# Patient Record
Sex: Female | Born: 1940 | Race: White | Hispanic: No | Marital: Married | Smoking: Former smoker
Health system: Southern US, Community
[De-identification: ages and names within clinical notes are randomized; demographics above are authoritative.]

## PROBLEM LIST (undated history)

## (undated) DIAGNOSIS — E785 Hyperlipidemia, unspecified: Secondary | ICD-10-CM

## (undated) DIAGNOSIS — I519 Heart disease, unspecified: Secondary | ICD-10-CM

## (undated) DIAGNOSIS — E538 Deficiency of other specified B group vitamins: Secondary | ICD-10-CM

## (undated) DIAGNOSIS — M109 Gout, unspecified: Secondary | ICD-10-CM

## (undated) DIAGNOSIS — I10 Essential (primary) hypertension: Secondary | ICD-10-CM

## (undated) DIAGNOSIS — S022XXA Fracture of nasal bones, initial encounter for closed fracture: Secondary | ICD-10-CM

## (undated) DIAGNOSIS — G309 Alzheimer's disease, unspecified: Principal | ICD-10-CM

## (undated) DIAGNOSIS — K649 Unspecified hemorrhoids: Secondary | ICD-10-CM

## (undated) DIAGNOSIS — F0281 Dementia in other diseases classified elsewhere with behavioral disturbance: Secondary | ICD-10-CM

## (undated) DIAGNOSIS — M810 Age-related osteoporosis without current pathological fracture: Secondary | ICD-10-CM

## (undated) DIAGNOSIS — I480 Paroxysmal atrial fibrillation: Secondary | ICD-10-CM

## (undated) DIAGNOSIS — R413 Other amnesia: Secondary | ICD-10-CM

## (undated) DIAGNOSIS — F0280 Dementia in other diseases classified elsewhere without behavioral disturbance: Secondary | ICD-10-CM

## (undated) HISTORY — DX: Alzheimer's disease, unspecified: G30.9

## (undated) HISTORY — DX: Paroxysmal atrial fibrillation: I48.0

## (undated) HISTORY — DX: Deficiency of other specified B group vitamins: E53.8

## (undated) HISTORY — DX: Dementia in other diseases classified elsewhere without behavioral disturbance: F02.80

## (undated) HISTORY — PX: TUBAL LIGATION: SHX77

## (undated) HISTORY — DX: Dementia in other diseases classified elsewhere with behavioral disturbance: F02.81

## (undated) HISTORY — DX: Essential (primary) hypertension: I10

## (undated) HISTORY — DX: Other amnesia: R41.3

## (undated) HISTORY — DX: Gout, unspecified: M10.9

## (undated) HISTORY — DX: Age-related osteoporosis without current pathological fracture: M81.0

## (undated) HISTORY — DX: Hyperlipidemia, unspecified: E78.5

## (undated) HISTORY — DX: Heart disease, unspecified: I51.9

## (undated) HISTORY — DX: Fracture of nasal bones, initial encounter for closed fracture: S02.2XXA

## (undated) HISTORY — DX: Unspecified hemorrhoids: K64.9

---

## 2005-04-28 ENCOUNTER — Ambulatory Visit: Payer: Self-pay | Admitting: Rheumatology

## 2006-07-26 ENCOUNTER — Ambulatory Visit: Payer: Self-pay | Admitting: Gastroenterology

## 2007-09-20 ENCOUNTER — Ambulatory Visit: Payer: Self-pay | Admitting: Unknown Physician Specialty

## 2010-03-17 ENCOUNTER — Emergency Department: Payer: Self-pay | Admitting: Internal Medicine

## 2010-03-18 ENCOUNTER — Encounter: Payer: Self-pay | Admitting: Cardiovascular Disease

## 2010-03-18 DIAGNOSIS — R0602 Shortness of breath: Secondary | ICD-10-CM | POA: Insufficient documentation

## 2010-03-18 DIAGNOSIS — R079 Chest pain, unspecified: Secondary | ICD-10-CM

## 2010-03-25 ENCOUNTER — Ambulatory Visit: Payer: Self-pay | Admitting: Cardiovascular Disease

## 2010-03-26 ENCOUNTER — Telehealth: Payer: Self-pay | Admitting: Cardiovascular Disease

## 2010-07-29 ENCOUNTER — Ambulatory Visit: Payer: Self-pay | Admitting: Internal Medicine

## 2011-01-20 NOTE — Miscellaneous (Signed)
Summary: MV order  Clinical Lists Changes  Problems: Added new problem of CHEST PAIN UNSPECIFIED (ICD-786.50) Added new problem of SHORTNESS OF BREATH (ICD-786.05) Orders: Added new Referral order of Nuclear Stress Test (Nuc Stress Test) - Signed

## 2011-01-20 NOTE — Progress Notes (Signed)
Summary: LEXISCAN  Phone Note Call from Patient Call back at [phone]   Caller: SELF Call For: [facility] Summary of Call: PT IS CALLING ABOUT LEXISCAN FROM YESTERDAY-WAS TOLD TO CALL IF SHE HAD NOT HEARD ANYTHING TO CALL THIS AFTERNOON-[facility] TOLD HER THAT [facility] SIGNED THE REPORT AND HOUR AGO.  PLEASE CALL #[phone] Initial call taken by: Harlon Flor,  March 26, 2010 3:16 PM  Follow-up for Phone Call        instructed pt that a final report had not been issued.  When the information becomes available she will be notified of results.  Follow-up by: Charlena Cross, RN, BSN,  March 26, 2010 3:27 PM  Additional Follow-up for Phone Call Additional follow up Details #1::        preliminary report given. Additional Follow-up by: Charlena Cross, RN, BSN,  March 27, 2010 10:36 AM

## 2011-07-08 ENCOUNTER — Ambulatory Visit: Payer: Self-pay | Admitting: Internal Medicine

## 2011-07-30 ENCOUNTER — Encounter: Payer: Self-pay | Admitting: Internal Medicine

## 2012-01-22 ENCOUNTER — Other Ambulatory Visit: Payer: Self-pay | Admitting: *Deleted

## 2012-01-22 MED ORDER — BENAZEPRIL-HYDROCHLOROTHIAZIDE 20-12.5 MG PO TABS: 1.0000 | ORAL_TABLET | Freq: Every day | ORAL | Status: DC

## 2012-02-15 ENCOUNTER — Telehealth: Payer: Self-pay | Admitting: *Deleted

## 2012-02-15 MED ORDER — LANSOPRAZOLE 30 MG PO CPDR: 30.0000 mg | DELAYED_RELEASE_CAPSULE | Freq: Every day | ORAL | Status: DC

## 2012-02-15 NOTE — Telephone Encounter (Signed)
Pharm faxed RF request -  Lansoprazole 30 mg 1 qd. OK?

## 2012-02-15 NOTE — Telephone Encounter (Signed)
OK to fill

## 2012-02-23 DIAGNOSIS — L719 Rosacea, unspecified: Secondary | ICD-10-CM | POA: Diagnosis not present

## 2012-04-04 ENCOUNTER — Other Ambulatory Visit: Payer: Self-pay | Admitting: Internal Medicine

## 2012-06-03 ENCOUNTER — Ambulatory Visit (INDEPENDENT_AMBULATORY_CARE_PROVIDER_SITE_OTHER): Payer: Medicare Other | Admitting: Internal Medicine

## 2012-06-03 ENCOUNTER — Encounter: Payer: Self-pay | Admitting: Internal Medicine

## 2012-06-03 ENCOUNTER — Telehealth: Payer: Self-pay | Admitting: Internal Medicine

## 2012-06-03 VITALS — BP 104/60 | HR 80 | Temp 98.7°F | Ht 60.25 in | Wt 151.0 lb

## 2012-06-03 DIAGNOSIS — M109 Gout, unspecified: Secondary | ICD-10-CM | POA: Diagnosis not present

## 2012-06-03 DIAGNOSIS — Z23 Encounter for immunization: Secondary | ICD-10-CM | POA: Diagnosis not present

## 2012-06-03 DIAGNOSIS — Z0000 Encounter for general adult medical examination without abnormal findings: Secondary | ICD-10-CM

## 2012-06-03 DIAGNOSIS — I10 Essential (primary) hypertension: Secondary | ICD-10-CM

## 2012-06-03 DIAGNOSIS — M810 Age-related osteoporosis without current pathological fracture: Secondary | ICD-10-CM

## 2012-06-03 LAB — COMPREHENSIVE METABOLIC PANEL
Albumin: 4 g/dL (ref 3.5–5.2)
BUN: 24 mg/dL — ABNORMAL HIGH (ref 6–23)
Calcium: 9.5 mg/dL (ref 8.4–10.5)
Chloride: 102 mEq/L (ref 96–112)
Creatinine, Ser: 1.1 mg/dL (ref 0.4–1.2)
Glucose, Bld: 98 mg/dL (ref 70–99)
Potassium: 3.7 mEq/L (ref 3.5–5.1)

## 2012-06-03 LAB — LIPID PANEL
Cholesterol: 220 mg/dL — ABNORMAL HIGH (ref 0–200)
Triglycerides: 228 mg/dL — ABNORMAL HIGH (ref 0.0–149.0)

## 2012-06-03 LAB — LDL CHOLESTEROL, DIRECT: Direct LDL: 137.4 mg/dL

## 2012-06-03 LAB — MICROALBUMIN / CREATININE URINE RATIO: Microalb Creat Ratio: 0.3 mg/g (ref 0.0–30.0)

## 2012-06-03 MED ORDER — FEBUXOSTAT 80 MG PO TABS: 80.0000 mg | ORAL_TABLET | Freq: Every day | ORAL | Status: DC

## 2012-06-03 NOTE — Telephone Encounter (Signed)
Patient has low bone density based on DEXA scan from 2011. She needs evaluation for Prolia. She has used this in the past. We need to get insurance approval

## 2012-06-03 NOTE — Assessment & Plan Note (Signed)
Previously on Prolia. Will look into insurance coverage for this medication.

## 2012-06-03 NOTE — Assessment & Plan Note (Signed)
Currently asymptomatic. Will continue Uloric. Uric acid level today.

## 2012-06-03 NOTE — Assessment & Plan Note (Signed)
General exam normal today. Patient is up-to-date on health maintenance. Will get records on recent mammogram and bone density testing. Will repeat labs today including CBC, CMP, lipid profile. Followup 6 months or sooner if needed.

## 2012-06-03 NOTE — Progress Notes (Signed)
Subjective:    Patient ID: Jennifer Klein, female    DOB: [birth year], 71 y.o.   MRN: [id number]  HPI The patient is here for annual Medicare wellness examination and management of other chronic and acute problems.   The risk factors are reflected in the social history.  The roster of all physicians providing medical care to patient - is listed in the Snapshot section of the chart.  Activities of daily living:  The patient is 100% independent in all ADLs: dressing, toileting, feeding as well as independent mobility  Home safety : The patient has smoke detectors in the home. They wear seatbelts.  There are no firearms at home. There is no violence in the home.   There is no risks for hepatitis, STDs or HIV. There is no history of blood transfusion. They have no travel history to infectious disease endemic areas of the world.  The patient has seen their dentist in the last six month (Dr. Dossie Der). They have seen their eye doctor in the last year [facility] - Dr. Oren Bracket, changing to Dr. Clydene Pugh).No issues with hearing.  They do not  have excessive sun exposure. Discussed the need for sun protection: hats, long sleeves and use of sunscreen if there is significant sun exposure.   Diet: the importance of a healthy diet is discussed. They do have a healthy diet.  The benefits of regular aerobic exercise were discussed. She occasionally goes to [employer]. Recommended walking 5 days per week.  Depression screen: there are no signs or vegative symptoms of depression- irritability, change in appetite, anhedonia, sadness/tearfullness.  Cognitive assessment: the patient manages all their financial and personal affairs and is actively engaged. They could relate day,date,year and events.  The following portions of the patient's history were reviewed and updated as appropriate: allergies, current medications, past family history, past medical history,  past surgical history, past social history   and problem list.  Visual acuity was not assessed per patient preference since she has regular follow up with her ophthalmologist. Hearing and body mass index were assessed and reviewed.   During the course of the visit the patient was educated and counseled about appropriate screening and preventive services including : fall prevention , diabetes screening, nutrition counseling, colorectal cancer screening, and recommended immunizations.     Outpatient Encounter Prescriptions as of 06/03/2012  Medication Sig Dispense Refill  . benazepril-hydrochlorthiazide (LOTENSIN HCT) 20-12.5 MG per tablet Take 1 tablet by mouth daily.  90 tablet  1  . busPIRone (BUSPAR) 15 MG tablet TAKE ONE TABLET BY MOUTH ONCE DAILY  30 tablet  3  . fexofenadine (ALLERGY RELIEF) 180 MG tablet Take 180 mg by mouth daily.      . lansoprazole (PREVACID) 30 MG capsule Take 1 capsule (30 mg total) by mouth daily.  90 capsule  1  . DISCONTD: febuxostat (ULORIC) 40 MG tablet Take 80 mg by mouth daily.      . Febuxostat (ULORIC) 80 MG TABS Take 1 tablet (80 mg total) by mouth daily.  30 tablet  11    Review of Systems  Constitutional: Negative for fever, chills, appetite change, fatigue and unexpected weight change.  HENT: Negative for ear pain, congestion, sore throat, trouble swallowing, neck pain, voice change and sinus pressure.   Eyes: Negative for visual disturbance.  Respiratory: Negative for cough, shortness of breath, wheezing and stridor.   Cardiovascular: Negative for chest pain, palpitations and leg swelling.  Gastrointestinal: Negative for nausea, vomiting, abdominal pain, diarrhea, constipation,  blood in stool, abdominal distention and anal bleeding.  Genitourinary: Negative for dysuria and flank pain.  Musculoskeletal: Negative for myalgias, arthralgias and gait problem.  Skin: Negative for color change and rash.  Neurological: Negative for dizziness and headaches.  Hematological: Negative for adenopathy.  Does not bruise/bleed easily.  Psychiatric/Behavioral: Negative for suicidal ideas, disturbed wake/sleep cycle and dysphoric mood. The patient is not nervous/anxious.    BP 104/60  Pulse 80  Temp 98.7 F (37.1 C) (Oral)  Ht 5' 0.25" (1.53 m)  Wt 151 lb (68.493 kg)  BMI 29.25 kg/m2  SpO2 97%     Objective:   Physical Exam  Constitutional: She is oriented to person, place, and time. She appears well-developed and well-nourished. No distress.  HENT:  Head: Normocephalic and atraumatic.  Right Ear: External ear normal.  Left Ear: External ear normal.  Nose: Nose normal.  Mouth/Throat: Oropharynx is clear and moist. No oropharyngeal exudate.  Eyes: Conjunctivae are normal. Pupils are equal, round, and reactive to light. Right eye exhibits no discharge. Left eye exhibits no discharge. No scleral icterus.  Neck: Normal range of motion. Neck supple. No tracheal deviation present. No thyromegaly present.  Cardiovascular: Normal rate, regular rhythm, normal heart sounds and intact distal pulses.  Exam reveals no gallop and no friction rub.   No murmur heard. Pulmonary/Chest: Effort normal and breath sounds normal. No respiratory distress. She has no wheezes. She has no rales. She exhibits no tenderness.  Abdominal: Soft. Bowel sounds are normal. She exhibits no distension and no mass. There is no tenderness. There is no guarding.  Musculoskeletal: Normal range of motion. She exhibits no edema and no tenderness.  Lymphadenopathy:    She has no cervical adenopathy.  Neurological: She is alert and oriented to person, place, and time. No cranial nerve deficit. She exhibits normal muscle tone. Coordination normal.  Skin: Skin is warm and dry. No rash noted. She is not diaphoretic. No erythema. No pallor.  Psychiatric: She has a normal mood and affect. Her behavior is normal. Judgment and thought content normal.          Assessment & Plan:

## 2012-06-03 NOTE — Assessment & Plan Note (Signed)
Blood pressure well-controlled today. Will continue current medications. Will check renal function with labs. Follow up 6 months.

## 2012-06-07 NOTE — Telephone Encounter (Signed)
I have emailed Ruby Mcclinton to [employer] about approval for this.

## 2012-06-28 ENCOUNTER — Telehealth: Payer: Self-pay | Admitting: Internal Medicine

## 2012-06-28 NOTE — Telephone Encounter (Signed)
Patient aware of Mammogram appt.

## 2012-06-28 NOTE — Telephone Encounter (Signed)
Patient scheduled at [facility] on 8.13.13 @ 12:45 arrive at 12:30 . No Calcium products 24 hours before appointment. Left message for patient to call office.

## 2012-07-26 ENCOUNTER — Telehealth: Payer: Self-pay | Admitting: *Deleted

## 2012-07-26 NOTE — Telephone Encounter (Signed)
Received fax from pharmacy stating that PA is needed on Uloric 80mg , called [employer] [facility] and they faxed PA form.  Form given to Dr. Dan Humphreys.

## 2012-07-28 ENCOUNTER — Telehealth: Payer: Self-pay | Admitting: *Deleted

## 2012-07-28 NOTE — Telephone Encounter (Signed)
Received PA approval for Uloric 80mg , approval good for the life of the policy.  Pharmacy notified.

## 2012-08-01 ENCOUNTER — Telehealth: Payer: Self-pay | Admitting: Internal Medicine

## 2012-08-01 NOTE — Telephone Encounter (Signed)
Received fax from insurance company stating that patient would need to pay 147. For her prolia shot.

## 2012-08-01 NOTE — Telephone Encounter (Signed)
Patient advised as instructed via telephone, she will let us know if she would like [place] to order this injection.

## 2012-08-02 ENCOUNTER — Ambulatory Visit: Payer: Self-pay | Admitting: Internal Medicine

## 2012-08-02 DIAGNOSIS — M899 Disorder of bone, unspecified: Secondary | ICD-10-CM | POA: Diagnosis not present

## 2012-08-02 DIAGNOSIS — Z1231 Encounter for screening mammogram for malignant neoplasm of breast: Secondary | ICD-10-CM | POA: Diagnosis not present

## 2012-08-02 DIAGNOSIS — Z13820 Encounter for screening for osteoporosis: Secondary | ICD-10-CM | POA: Diagnosis not present

## 2012-08-02 LAB — HM MAMMOGRAPHY: HM Mammogram: NORMAL

## 2012-08-03 ENCOUNTER — Telehealth: Payer: Self-pay | Admitting: Internal Medicine

## 2012-08-03 NOTE — Telephone Encounter (Signed)
Bone density test showed osteopenia with T-score -2.3. We should make an appointment to discuss treatment

## 2012-08-04 NOTE — Telephone Encounter (Signed)
Left message with spouse to have patient return call.

## 2012-08-04 NOTE — Telephone Encounter (Signed)
Patient advised as instructed via telephone, appt scheduled for 08/08/2012 at 11:00.

## 2012-08-08 ENCOUNTER — Encounter: Payer: Self-pay | Admitting: Internal Medicine

## 2012-08-08 ENCOUNTER — Ambulatory Visit (INDEPENDENT_AMBULATORY_CARE_PROVIDER_SITE_OTHER): Payer: Medicare Other | Admitting: Internal Medicine

## 2012-08-08 VITALS — BP 112/70 | HR 64 | Temp 97.7°F | Ht 60.25 in | Wt 152.2 lb

## 2012-08-08 DIAGNOSIS — R413 Other amnesia: Secondary | ICD-10-CM | POA: Diagnosis not present

## 2012-08-08 DIAGNOSIS — M8580 Other specified disorders of bone density and structure, unspecified site: Secondary | ICD-10-CM

## 2012-08-08 DIAGNOSIS — M899 Disorder of bone, unspecified: Secondary | ICD-10-CM

## 2012-08-08 LAB — COMPREHENSIVE METABOLIC PANEL
Albumin: 3.8 g/dL (ref 3.5–5.2)
CO2: 27 mEq/L (ref 19–32)
GFR: 61.32 mL/min (ref >60.00)
Glucose, Bld: 78 mg/dL (ref 70–99)
Potassium: 3.8 mEq/L (ref 3.5–5.1)
Sodium: 139 mEq/L (ref 135–145)
Total Bilirubin: 0.6 mg/dL (ref 0.3–1.2)
Total Protein: 6.7 g/dL (ref 6.0–8.3)

## 2012-08-08 NOTE — Progress Notes (Signed)
Subjective:    Patient ID: Jennifer Klein, female    DOB: 1941/07/31, 71 y.o.   MRN: [id number]  HPI 71 year old female with history of hypertension, gout, and osteoporosis presents for followup. In regards to her osteoporosis, recent bone density testing showed T score of -2.3. She had taken Prolia in the past but has not recently had this medication. She would like to resume therapy. She has not had any fractures.  She is concerned today about memory loss. She reports that one of her family members noted that she repeated herself. We reviewed testing performed by psychologist in August 2012. This showed mild early dementia. She is not currently interested in additional evaluation or trying medication. 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 She is very active and participates in an art class several times per week.  Outpatient Encounter Prescriptions as of 08/08/2012  Medication Sig Dispense Refill  . benazepril-hydrochlorthiazide (LOTENSIN HCT) 20-12.5 MG per tablet Take 1 tablet by mouth daily.  90 tablet  1  . busPIRone (BUSPAR) 15 MG tablet TAKE ONE TABLET BY MOUTH ONCE DAILY  30 tablet  3  . Febuxostat (ULORIC) 80 MG TABS Take 1 tablet (80 mg total) by mouth daily.  30 tablet  11  . fexofenadine (ALLERGY RELIEF) 180 MG tablet Take 180 mg by mouth daily.      . lansoprazole (PREVACID) 30 MG capsule Take 1 capsule (30 mg total) by mouth daily.  90 capsule  1    Review of Systems  Constitutional: Negative for fever, chills, appetite change, fatigue and unexpected weight change.  HENT: Negative for ear pain, congestion, sore throat, trouble swallowing, neck pain, voice change and sinus pressure.   Eyes: Negative for visual disturbance.  Respiratory: Negative for cough, shortness of breath, wheezing and stridor.   Cardiovascular: Negative for chest pain, palpitations and leg swelling.    Gastrointestinal: Negative for nausea, vomiting, abdominal pain, diarrhea, constipation, blood in stool, abdominal distention and anal bleeding.  Genitourinary: Negative for dysuria and flank pain.  Musculoskeletal: Negative for myalgias, arthralgias and gait problem.  Skin: Negative for color change and rash.  Neurological: Negative for dizziness and headaches.  Hematological: Negative for adenopathy. Does not bruise/bleed easily.  Psychiatric/Behavioral: Negative for suicidal ideas, disturbed wake/sleep cycle and dysphoric mood. The patient is not nervous/anxious.    BP 112/70  Pulse 64  Temp 97.7 F (36.5 C) (Oral)  Ht 5' 0.25" (1.53 m)  Wt 152 lb 4 oz (69.06 kg)  BMI 29.49 kg/m2  SpO2 97%     Objective:   Physical Exam  Constitutional: She is oriented to person, place, and time. She appears well-developed and well-nourished. No distress.  HENT:  Head: Normocephalic and atraumatic.  Right Ear: External ear normal.  Left Ear: External ear normal.  Nose: Nose normal.  Mouth/Throat: Oropharynx is clear and moist. No oropharyngeal exudate.  Eyes: Conjunctivae are normal. Pupils are equal, round, and reactive to light. Right eye exhibits no discharge. Left eye exhibits no discharge. No scleral icterus.  Neck: Normal range of motion. Neck supple. No tracheal deviation present. No thyromegaly present.  Cardiovascular: Normal rate, regular rhythm, normal heart sounds and intact distal pulses.  Exam reveals no gallop and no friction rub.   No murmur heard. Pulmonary/Chest: Effort normal and breath sounds normal. No respiratory distress. She has no wheezes. She has no rales. She exhibits no tenderness.  Musculoskeletal:  Normal range of motion. She exhibits no edema and no tenderness.  Lymphadenopathy:    She has no cervical adenopathy.  Neurological: She is alert and oriented to person, place, and time. No cranial nerve deficit. She exhibits normal muscle tone. Coordination normal.   Skin: Skin is warm and dry. No rash noted. She is not diaphoretic. No erythema. No pallor.  Psychiatric: She has a normal mood and affect. Her behavior is normal. Judgment and thought content normal.          Assessment & Plan:

## 2012-08-08 NOTE — Assessment & Plan Note (Signed)
Reviewed cognitive testing which showed mild memory loss as of August 2012. Patient is not interested in referral to tertiary care center for additional testing at this time. She is not interested in medications. Will continue to monitor.

## 2012-08-08 NOTE — Assessment & Plan Note (Addendum)
Recent bone density testing was consistent with osteopenia with T score of -2.3. We'll plan to resume therapy with Prolia. medication has been ordered. Will check calcium and vitamin D levels with labs today

## 2012-08-09 LAB — VITAMIN D 25 HYDROXY (VIT D DEFICIENCY, FRACTURES): Vit D, 25-Hydroxy: 44 ng/mL (ref 30–89)

## 2012-10-03 ENCOUNTER — Encounter: Payer: Self-pay | Admitting: Internal Medicine

## 2012-10-03 ENCOUNTER — Ambulatory Visit (INDEPENDENT_AMBULATORY_CARE_PROVIDER_SITE_OTHER): Payer: Medicare Other | Admitting: Internal Medicine

## 2012-10-03 ENCOUNTER — Other Ambulatory Visit: Payer: Self-pay

## 2012-10-03 VITALS — BP 138/82 | HR 76 | Temp 97.9°F | Ht 60.25 in | Wt 152.8 lb

## 2012-10-03 DIAGNOSIS — M899 Disorder of bone, unspecified: Secondary | ICD-10-CM | POA: Diagnosis not present

## 2012-10-03 DIAGNOSIS — M810 Age-related osteoporosis without current pathological fracture: Secondary | ICD-10-CM | POA: Diagnosis not present

## 2012-10-03 DIAGNOSIS — R413 Other amnesia: Secondary | ICD-10-CM

## 2012-10-03 DIAGNOSIS — M8580 Other specified disorders of bone density and structure, unspecified site: Secondary | ICD-10-CM

## 2012-10-03 DIAGNOSIS — I10 Essential (primary) hypertension: Secondary | ICD-10-CM | POA: Diagnosis not present

## 2012-10-03 MED ORDER — DENOSUMAB 60 MG/ML ~~LOC~~ SOLN
60.0000 mg | Freq: Once | SUBCUTANEOUS | Status: AC
  Administered 2012-10-03: 60 mg via SUBCUTANEOUS

## 2012-10-03 NOTE — Assessment & Plan Note (Signed)
Patient with osteopenia and recent T score of -2.3. Reviewed risk of fracture today. Will restart treatment with Prolia and patient will continue home dosing of calcium 1200 mg daily and vitamin D 1000 units daily. Next Prolia due 03/2013.

## 2012-10-03 NOTE — Assessment & Plan Note (Signed)
Blood pressure is well-controlled on current medications. Will check renal function with labs today.

## 2012-10-03 NOTE — Assessment & Plan Note (Signed)
Ongoing concerns about memory loss. Local evaluation with psychologist showed mild early dementia. Will recheck TSH and B12 with labs today. Will set up referral to tertiary care Center for more complete evaluation for early dementia. We discussed potential use of medications for dementia such as Aricept or Namenda, but patient would prefer to hold off for now.

## 2012-10-03 NOTE — Progress Notes (Signed)
Subjective:    Patient ID: Jennifer Klein, female    DOB: 13-Jul-1941, 71 y.o.   MRN: [id number]  HPI 71 year old female with history of hypertension, gout, mild dementia, and osteopenia presents for followup. She has 2 concerns today. First, in regards to osteopenia she is given more thought to continuing treatment with Prolia, and she would like to move forward with this. She continues on calcium and vitamin D supplementations at home. Recent bone density testing showed T score of -2.3.  In regards to memory loss, she reports that friends have noted that she is repeating herself more frequently and seems to be more forgetful. She herself has not noticed this. She is interested in setting up additional evaluation as to the cause of memory loss. She is not interested in starting on medications to help with memory loss at this time.  Outpatient Encounter Prescriptions as of 10/03/2012  Medication Sig Dispense Refill  . benazepril-hydrochlorthiazide (LOTENSIN HCT) 20-12.5 MG per tablet Take 1 tablet by mouth daily.  90 tablet  1  . busPIRone (BUSPAR) 15 MG tablet TAKE ONE TABLET BY MOUTH ONCE DAILY  30 tablet  3  . Febuxostat (ULORIC) 80 MG TABS Take 1 tablet (80 mg total) by mouth daily.  30 tablet  11  . fexofenadine (ALLERGY RELIEF) 180 MG tablet Take 180 mg by mouth daily.      . lansoprazole (PREVACID) 30 MG capsule Take 1 capsule (30 mg total) by mouth daily.  90 capsule  1   Facility-Administered Encounter Medications as of 10/03/2012  Medication Dose Route Frequency Provider Last Rate Last Dose  . denosumab (PROLIA) injection 60 mg  60 mg Subcutaneous Once Shelia Media, MD   60 mg at 10/03/12 1422   BP 138/82  Pulse 76  Temp 97.9 F (36.6 C) (Oral)  Ht 5' 0.25" (1.53 m)  Wt 152 lb 12 oz (69.287 kg)  BMI 29.58 kg/m2  SpO2 94%  Review of Systems  Constitutional: Negative for fever, chills, appetite change, fatigue and unexpected weight change.  HENT: Negative for ear pain,  congestion, sore throat, trouble swallowing, neck pain, voice change and sinus pressure.   Eyes: Negative for visual disturbance.  Respiratory: Negative for cough, shortness of breath, wheezing and stridor.   Cardiovascular: Negative for chest pain, palpitations and leg swelling.  Gastrointestinal: Negative for nausea, vomiting, abdominal pain, diarrhea, constipation, blood in stool, abdominal distention and anal bleeding.  Genitourinary: Negative for dysuria and flank pain.  Musculoskeletal: Negative for myalgias, arthralgias and gait problem.  Skin: Negative for color change and rash.  Neurological: Negative for dizziness and headaches.  Hematological: Negative for adenopathy. Does not bruise/bleed easily.  Psychiatric/Behavioral: Negative for suicidal ideas, confusion, disturbed wake/sleep cycle, dysphoric mood and decreased concentration. The patient is not nervous/anxious.        Objective:   Physical Exam  Constitutional: She is oriented to person, place, and time. She appears well-developed and well-nourished. No distress.  HENT:  Head: Normocephalic and atraumatic.  Right Ear: External ear normal.  Left Ear: External ear normal.  Nose: Nose normal.  Mouth/Throat: Oropharynx is clear and moist. No oropharyngeal exudate.  Eyes: Conjunctivae normal are normal. Pupils are equal, round, and reactive to light. Right eye exhibits no discharge. Left eye exhibits no discharge. No scleral icterus.  Neck: Normal range of motion. Neck supple. No tracheal deviation present. No thyromegaly present.  Cardiovascular: Normal rate, regular rhythm, normal heart sounds and intact distal pulses.  Exam reveals no gallop and  no friction rub.   No murmur heard. Pulmonary/Chest: Effort normal and breath sounds normal. No respiratory distress. She has no wheezes. She has no rales. She exhibits no tenderness.  Musculoskeletal: Normal range of motion. She exhibits no edema and no tenderness.    Lymphadenopathy:    She has no cervical adenopathy.  Neurological: She is alert and oriented to person, place, and time. No cranial nerve deficit. She exhibits normal muscle tone. Coordination normal.  Skin: Skin is warm and dry. No rash noted. She is not diaphoretic. No erythema. No pallor.  Psychiatric: She has a normal mood and affect. Her behavior is normal. Judgment and thought content normal.          Assessment & Plan:

## 2012-10-05 ENCOUNTER — Other Ambulatory Visit: Payer: Medicare Other

## 2012-11-09 DIAGNOSIS — H35079 Retinal telangiectasis, unspecified eye: Secondary | ICD-10-CM | POA: Diagnosis not present

## 2012-11-09 DIAGNOSIS — H2510 Age-related nuclear cataract, unspecified eye: Secondary | ICD-10-CM | POA: Diagnosis not present

## 2012-12-07 ENCOUNTER — Ambulatory Visit: Payer: Medicare Other | Admitting: Internal Medicine

## 2012-12-27 ENCOUNTER — Other Ambulatory Visit: Payer: Self-pay | Admitting: Internal Medicine

## 2012-12-27 MED ORDER — LANSOPRAZOLE 30 MG PO CPDR: 30.0000 mg | DELAYED_RELEASE_CAPSULE | Freq: Every day | ORAL | Status: DC

## 2012-12-27 NOTE — Telephone Encounter (Signed)
lansoprazole (PREVACID) 30 MG capsule  #90

## 2012-12-27 NOTE — Telephone Encounter (Signed)
Rx sent to pharmacy electronically.   

## 2013-01-09 ENCOUNTER — Encounter: Payer: Self-pay | Admitting: Internal Medicine

## 2013-01-09 ENCOUNTER — Ambulatory Visit (INDEPENDENT_AMBULATORY_CARE_PROVIDER_SITE_OTHER): Payer: Medicare Other | Admitting: Internal Medicine

## 2013-01-09 VITALS — BP 116/70 | HR 80 | Temp 98.0°F | Ht 60.03 in | Wt 147.8 lb

## 2013-01-09 DIAGNOSIS — R413 Other amnesia: Secondary | ICD-10-CM

## 2013-01-09 DIAGNOSIS — I10 Essential (primary) hypertension: Secondary | ICD-10-CM

## 2013-01-09 LAB — MICROALBUMIN / CREATININE URINE RATIO
Microalb Creat Ratio: 0.2 mg/g (ref 0.0–30.0)
Microalb, Ur: 0.8 mg/dL (ref 0.0–1.9)

## 2013-01-09 LAB — COMPREHENSIVE METABOLIC PANEL
ALT: 14 U/L (ref 0–35)
Alkaline Phosphatase: 72 U/L (ref 39–117)
CO2: 25 mEq/L (ref 19–32)
Creatinine, Ser: 1.1 mg/dL (ref 0.4–1.2)
GFR: 54.57 mL/min — ABNORMAL LOW (ref >60.00)
Total Bilirubin: 0.8 mg/dL (ref 0.3–1.2)

## 2013-01-09 MED ORDER — BENAZEPRIL-HYDROCHLOROTHIAZIDE 20-12.5 MG PO TABS: 1.0000 | ORAL_TABLET | Freq: Every day | ORAL | Status: DC

## 2013-01-09 NOTE — Progress Notes (Signed)
Subjective:    Patient ID: Jennifer Klein, female    DOB: May 10, 1941, 72 y.o.   MRN: [id number]  HPI 72 year old female with history of hypertension and mild memory loss presents for followup. She reports she is generally doing well. She has no concerns about memory loss, but notes that her close friends note she often repeats herself. She continues to wait for appointment to be scheduled at [facility]. She reports that she is currently on a waiting list. She has received packet of information and has filled out and is ready for the appointment. She denies any new concerns today. 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  Outpatient Encounter Prescriptions as of 01/09/2013  Medication Sig Dispense Refill  . benazepril-hydrochlorthiazide (LOTENSIN HCT) 20-12.5 MG per tablet Take 1 tablet by mouth daily.  90 tablet  4  . busPIRone (BUSPAR) 15 MG tablet TAKE ONE TABLET BY MOUTH ONCE DAILY  30 tablet  3  . Febuxostat (ULORIC) 80 MG TABS Take 1 tablet (80 mg total) by mouth daily.  30 tablet  11  . fexofenadine (ALLERGY RELIEF) 180 MG tablet Take 180 mg by mouth daily.      . lansoprazole (PREVACID) 30 MG capsule Take 1 capsule (30 mg total) by mouth daily.  90 capsule  1  . Loperamide HCl (IMODIUM A-D PO) Take by mouth as needed.      . [DISCONTINUED] benazepril-hydrochlorthiazide (LOTENSIN HCT) 20-12.5 MG per tablet Take 1 tablet by mouth daily.  90 tablet  1   BP 116/70  Pulse 80  Temp 98 F (36.7 C) (Oral)  Ht 5' 0.03" (1.525 m)  Wt 147 lb 12 oz (67.019 kg)  BMI 28.83 kg/m2  SpO2 96%  Review of Systems  Constitutional: Negative for fever, chills, appetite change, fatigue and unexpected weight change.  HENT: Negative for ear pain, congestion, sore throat, trouble swallowing, neck pain, voice change and sinus pressure.   Eyes: Negative for visual disturbance.  Respiratory: Negative for cough, shortness of breath, wheezing and stridor.     Cardiovascular: Negative for chest pain, palpitations and leg swelling.  Gastrointestinal: Negative for nausea, vomiting, abdominal pain, diarrhea, constipation, blood in stool, abdominal distention and anal bleeding.  Genitourinary: Negative for dysuria and flank pain.  Musculoskeletal: Negative for myalgias, arthralgias and gait problem.  Skin: Negative for color change and rash.  Neurological: Negative for dizziness and headaches.  Hematological: Negative for adenopathy. Does not bruise/bleed easily.  Psychiatric/Behavioral: Negative for suicidal ideas, sleep disturbance and dysphoric mood. The patient is not nervous/anxious.        Objective:   Physical Exam  Constitutional: She is oriented to person, place, and time. She appears well-developed and well-nourished. No distress.  HENT:  Head: Normocephalic and atraumatic.  Right Ear: External ear normal.  Left Ear: External ear normal.  Nose: Nose normal.  Mouth/Throat: Oropharynx is clear and moist. No oropharyngeal exudate.  Eyes: Conjunctivae normal are normal. Pupils are equal, round, and reactive to light. Right eye exhibits no discharge. Left eye exhibits no discharge. No scleral icterus.  Neck: Normal range of motion. Neck supple. No tracheal deviation present. No thyromegaly present.  Cardiovascular: Normal rate, regular rhythm, normal heart sounds and intact distal pulses.  Exam reveals no gallop and no friction rub.   No murmur heard. Pulmonary/Chest: Effort normal and breath sounds normal. No respiratory distress. She has no wheezes. She has no rales. She exhibits no tenderness.  Musculoskeletal: Normal range of  motion. She exhibits no edema and no tenderness.  Lymphadenopathy:    She has no cervical adenopathy.  Neurological: She is alert and oriented to person, place, and time. No cranial nerve deficit. She exhibits normal muscle tone. Coordination normal.  Skin: Skin is warm and dry. No rash noted. She is not  diaphoretic. No erythema. No pallor.  Psychiatric: She has a normal mood and affect. Her behavior is normal. Judgment and thought content normal.          Assessment & Plan:

## 2013-01-09 NOTE — Assessment & Plan Note (Signed)
BP Readings from Last 3 Encounters:  01/09/13 116/70  10/03/12 138/82  08/08/12 112/70    BP well controlled on current medications. Will continue Lotensin. Will check renal function and urine microalbumin with labs today.

## 2013-01-09 NOTE — Assessment & Plan Note (Signed)
Symptoms stable. Pt is waiting on appointment with [facility] cognitive specialist. Discussed potentially setting up a referral to [facility], but she would like to hold off for now.

## 2013-01-30 ENCOUNTER — Telehealth: Payer: Self-pay | Admitting: Internal Medicine

## 2013-01-30 NOTE — Telephone Encounter (Signed)
Pt has been tried to call the Neuro Department to get his wife in at [facility] but has not been able to reach anyone over there to make the Appointment.He is not sure what to do at this point.

## 2013-02-08 NOTE — Telephone Encounter (Signed)
Please have pt schedule a visit to discuss a new referral.

## 2013-02-10 NOTE — Telephone Encounter (Signed)
Patient scheduled for appointment 02/15/13 @ 245

## 2013-02-15 ENCOUNTER — Ambulatory Visit: Payer: Medicare Other | Admitting: Internal Medicine

## 2013-03-03 DIAGNOSIS — R21 Rash and other nonspecific skin eruption: Secondary | ICD-10-CM | POA: Diagnosis not present

## 2013-03-15 ENCOUNTER — Ambulatory Visit: Payer: Medicare Other | Admitting: Internal Medicine

## 2013-03-21 DIAGNOSIS — H00029 Hordeolum internum unspecified eye, unspecified eyelid: Secondary | ICD-10-CM | POA: Diagnosis not present

## 2013-03-24 ENCOUNTER — Encounter: Payer: Self-pay | Admitting: Internal Medicine

## 2013-04-03 DIAGNOSIS — H00039 Abscess of eyelid unspecified eye, unspecified eyelid: Secondary | ICD-10-CM | POA: Diagnosis not present

## 2013-04-03 DIAGNOSIS — H0019 Chalazion unspecified eye, unspecified eyelid: Secondary | ICD-10-CM | POA: Diagnosis not present

## 2013-04-10 DIAGNOSIS — H0019 Chalazion unspecified eye, unspecified eyelid: Secondary | ICD-10-CM | POA: Diagnosis not present

## 2013-04-10 DIAGNOSIS — L989 Disorder of the skin and subcutaneous tissue, unspecified: Secondary | ICD-10-CM | POA: Diagnosis not present

## 2013-05-05 DIAGNOSIS — Z0000 Encounter for general adult medical examination without abnormal findings: Secondary | ICD-10-CM | POA: Diagnosis not present

## 2013-05-05 DIAGNOSIS — K589 Irritable bowel syndrome without diarrhea: Secondary | ICD-10-CM | POA: Diagnosis not present

## 2013-05-05 DIAGNOSIS — I10 Essential (primary) hypertension: Secondary | ICD-10-CM | POA: Diagnosis not present

## 2013-05-05 DIAGNOSIS — M109 Gout, unspecified: Secondary | ICD-10-CM | POA: Diagnosis not present

## 2013-05-11 DIAGNOSIS — M810 Age-related osteoporosis without current pathological fracture: Secondary | ICD-10-CM | POA: Diagnosis not present

## 2013-05-24 DIAGNOSIS — R413 Other amnesia: Secondary | ICD-10-CM | POA: Diagnosis not present

## 2013-06-12 DIAGNOSIS — K591 Functional diarrhea: Secondary | ICD-10-CM | POA: Diagnosis not present

## 2013-06-27 ENCOUNTER — Other Ambulatory Visit: Payer: Self-pay | Admitting: *Deleted

## 2013-06-27 MED ORDER — LANSOPRAZOLE 30 MG PO CPDR: 30.0000 mg | DELAYED_RELEASE_CAPSULE | Freq: Every day | ORAL | Status: DC

## 2013-06-27 NOTE — Telephone Encounter (Signed)
Rx sent to pharmacy   

## 2013-07-03 ENCOUNTER — Other Ambulatory Visit: Payer: Self-pay | Admitting: *Deleted

## 2013-07-03 DIAGNOSIS — M109 Gout, unspecified: Secondary | ICD-10-CM

## 2013-07-03 MED ORDER — FEBUXOSTAT 80 MG PO TABS: 80.0000 mg | ORAL_TABLET | Freq: Every day | ORAL | Status: DC

## 2013-07-06 ENCOUNTER — Ambulatory Visit: Payer: Self-pay | Admitting: Gastroenterology

## 2013-07-06 DIAGNOSIS — Z8249 Family history of ischemic heart disease and other diseases of the circulatory system: Secondary | ICD-10-CM | POA: Diagnosis not present

## 2013-07-06 DIAGNOSIS — M109 Gout, unspecified: Secondary | ICD-10-CM | POA: Diagnosis not present

## 2013-07-06 DIAGNOSIS — K648 Other hemorrhoids: Secondary | ICD-10-CM | POA: Diagnosis not present

## 2013-07-06 DIAGNOSIS — R197 Diarrhea, unspecified: Secondary | ICD-10-CM | POA: Diagnosis not present

## 2013-07-06 DIAGNOSIS — Z8049 Family history of malignant neoplasm of other genital organs: Secondary | ICD-10-CM | POA: Diagnosis not present

## 2013-07-06 DIAGNOSIS — Z823 Family history of stroke: Secondary | ICD-10-CM | POA: Diagnosis not present

## 2013-07-06 DIAGNOSIS — Z79899 Other long term (current) drug therapy: Secondary | ICD-10-CM | POA: Diagnosis not present

## 2013-07-06 DIAGNOSIS — K5730 Diverticulosis of large intestine without perforation or abscess without bleeding: Secondary | ICD-10-CM | POA: Diagnosis not present

## 2013-07-06 DIAGNOSIS — I10 Essential (primary) hypertension: Secondary | ICD-10-CM | POA: Diagnosis not present

## 2013-07-06 DIAGNOSIS — M810 Age-related osteoporosis without current pathological fracture: Secondary | ICD-10-CM | POA: Diagnosis not present

## 2013-07-11 DIAGNOSIS — R197 Diarrhea, unspecified: Secondary | ICD-10-CM | POA: Diagnosis not present

## 2013-07-14 ENCOUNTER — Ambulatory Visit (INDEPENDENT_AMBULATORY_CARE_PROVIDER_SITE_OTHER): Payer: Medicare Other | Admitting: Cardiovascular Disease

## 2013-07-14 ENCOUNTER — Encounter: Payer: Self-pay | Admitting: Cardiovascular Disease

## 2013-07-14 ENCOUNTER — Telehealth: Payer: Self-pay | Admitting: *Deleted

## 2013-07-14 VITALS — BP 124/74 | HR 76 | Ht 61.0 in | Wt 138.5 lb

## 2013-07-14 DIAGNOSIS — I10 Essential (primary) hypertension: Secondary | ICD-10-CM

## 2013-07-14 DIAGNOSIS — R002 Palpitations: Secondary | ICD-10-CM

## 2013-07-14 DIAGNOSIS — I499 Cardiac arrhythmia, unspecified: Secondary | ICD-10-CM | POA: Diagnosis not present

## 2013-07-14 NOTE — Telephone Encounter (Signed)
Message copied by Kendrick Fries on Fri Jul 14, 2013  4:01 PM ------      Message from: Lisabeth Devoid F      Created: Fri Jul 14, 2013  3:57 PM      Regarding: 48 hour monitor       Needs 48 hour holter monitor ------

## 2013-07-14 NOTE — Patient Instructions (Addendum)
Your physician has recommended that you wear a holter monitor. Holter monitors are medical devices that record the heart's electrical activity. Doctors most often use these monitors to diagnose arrhythmias. Arrhythmias are problems with the speed or rhythm of the heartbeat. The monitor is a small, portable device. You can wear one while you do your normal daily activities. This is usually used to diagnose what is causing palpitations/syncope (passing out).    Monitor your blood pressure at home 2-3 times /week.  Follow up as needed.

## 2013-07-14 NOTE — Telephone Encounter (Signed)
Faxed order for 48 hour holter monitor to [facility]. They will contact pt to let her know when she can come to [facility] and  have monitor placed.

## 2013-07-14 NOTE — Assessment & Plan Note (Signed)
Blood pressure is controlled on current medications. 

## 2013-07-14 NOTE — Assessment & Plan Note (Signed)
This was noted recently during routine colonoscopy. Possibly due to premature beats. She does have a PAC on her baseline EKG. She is asymptomatic and reports no cardiac symptoms. However, I think it is important to make sure she does not have paroxysmal atrial fibrillation. Thus, I recommend evaluation with a 48-hour Holter monitor.

## 2013-07-14 NOTE — Progress Notes (Signed)
Primary care physician: Dr. Dossie Arbour  HPI  This is a pleasant 72 year old female who was referred by Dr. Servando Snare for evaluation of irregular heartbeats. She has no previous cardiac history. She has known history of hypertension and dementia. She recently underwent routine colonoscopy. The patient mentioned that she was told about high blood pressure after the procedure. However, after I talked with her husband it appears that the issue was irregular heartbeats that were noted on the monitor. The patient denies any chest pain, dyspnea, palpitations, dizziness or syncope.  Allergies  Allergen Reactions  . Darvon (Propoxyphene Hcl)     As a child     Current Outpatient Prescriptions on File Prior to Visit  Medication Sig Dispense Refill  . benazepril-hydrochlorthiazide (LOTENSIN HCT) 20-12.5 MG per tablet Take 1 tablet by mouth daily.  90 tablet  4  . Febuxostat (ULORIC) 80 MG TABS Take 1 tablet (80 mg total) by mouth daily.  30 tablet  0  . fexofenadine (ALLERGY RELIEF) 180 MG tablet Take 180 mg by mouth daily.      . lansoprazole (PREVACID) 30 MG capsule Take 1 capsule (30 mg total) by mouth daily.  90 capsule  0  . Loperamide HCl (IMODIUM A-D PO) Take by mouth as needed.       No current facility-administered medications on file prior to visit.     Past Medical History  Diagnosis Date  . Hypertension   . Gout   . Memory loss   . Senile osteoporosis      Past Surgical History  Procedure Laterality Date  . Tubal ligation       Family History  Problem Relation Age of Onset  . Heart disease Father   . Gout Brother      History   Social History  . Marital Status: Married    Spouse Name: N/A    Number of Children: N/A  . Years of Education: N/A   Occupational History  . Not on file.   Social History Main Topics  . Smoking status: Former Smoker -- 0.25 packs/day for 1 years    Types: Cigarettes  . Smokeless tobacco: Never Used  . Alcohol Use: Yes     Comment:  occassionally  . Drug Use: No  . Sexually Active: Not on file   Other Topics Concern  . Not on file   Social History Narrative   Lives with husband in [place].   Worked at [employer] and [place].      Diet - regular           ROS Constitutional: Negative for fever, chills, diaphoresis, activity change, appetite change and fatigue.  HENT: Negative for hearing loss, nosebleeds, congestion, sore throat, facial swelling, drooling, trouble swallowing, neck pain, voice change, sinus pressure and tinnitus.  Eyes: Negative for photophobia, pain, discharge and visual disturbance.  Respiratory: Negative for apnea, cough, chest tightness, shortness of breath and wheezing.  Cardiovascular: Negative for chest pain, palpitations and leg swelling.  Gastrointestinal: Negative for nausea, vomiting, abdominal pain, diarrhea, constipation, blood in stool and abdominal distention.  Genitourinary: Negative for dysuria, urgency, frequency, hematuria and decreased urine volume.  Musculoskeletal: Negative for myalgias, back pain, joint swelling, arthralgias and gait problem.  Skin: Negative for color change, pallor, rash and wound.  Neurological: Negative for dizziness, tremors, seizures, syncope, speech difficulty, weakness, light-headedness, numbness and headaches.  Psychiatric/Behavioral: Negative for suicidal ideas, hallucinations, behavioral problems and agitation. The patient is not nervous/anxious.     PHYSICAL EXAM  BP 124/74  Pulse 76  Ht 5\' 1"  (1.549 m)  Wt 138 lb 8 oz (62.823 kg)  BMI 26.18 kg/m2 Constitutional: She is oriented to person, place, and time. She appears well-developed and well-nourished. No distress.  HENT: No nasal discharge.  Head: Normocephalic and atraumatic.  Eyes: Pupils are equal and round. Right eye exhibits no discharge. Left eye exhibits no discharge.  Neck: Normal range of motion. Neck supple. No JVD present. No thyromegaly present.  Cardiovascular: Normal rate,  regular rhythm, normal heart sounds. Exam reveals no gallop and no friction rub. No murmur heard.  Pulmonary/Chest: Effort normal and breath sounds normal. No stridor. No respiratory distress. She has no wheezes. She has no rales. She exhibits no tenderness.  Abdominal: Soft. Bowel sounds are normal. She exhibits no distension. There is no tenderness. There is no rebound and no guarding.  Musculoskeletal: Normal range of motion. She exhibits no edema and no tenderness.  Neurological: She is alert and oriented to person, place, and time. Coordination normal.  Skin: Skin is warm and dry. No rash noted. She is not diaphoretic. No erythema. No pallor.  Psychiatric: She has a normal mood and affect. Her behavior is normal. Judgment and thought content normal.     ZOX:WRUEA  Rhythm  -Short PR syndrome  - occasional PAC    PRi = 116  # PACs = 1. Low voltage in precordial leads.   -Nonspecific ST changes  ABNORMAL    ASSESSMENT AND PLAN

## 2013-07-18 DIAGNOSIS — H00019 Hordeolum externum unspecified eye, unspecified eyelid: Secondary | ICD-10-CM | POA: Diagnosis not present

## 2013-07-24 DIAGNOSIS — R002 Palpitations: Secondary | ICD-10-CM

## 2013-08-01 DIAGNOSIS — H0019 Chalazion unspecified eye, unspecified eyelid: Secondary | ICD-10-CM | POA: Diagnosis not present

## 2013-08-07 ENCOUNTER — Encounter: Payer: Self-pay | Admitting: Cardiovascular Disease

## 2013-08-07 ENCOUNTER — Telehealth: Payer: Self-pay

## 2013-08-07 ENCOUNTER — Ambulatory Visit (INDEPENDENT_AMBULATORY_CARE_PROVIDER_SITE_OTHER): Payer: Medicare Other | Admitting: Cardiovascular Disease

## 2013-08-07 VITALS — BP 112/74 | HR 100 | Ht 62.0 in | Wt 139.5 lb

## 2013-08-07 DIAGNOSIS — I499 Cardiac arrhythmia, unspecified: Secondary | ICD-10-CM

## 2013-08-07 DIAGNOSIS — I10 Essential (primary) hypertension: Secondary | ICD-10-CM | POA: Diagnosis not present

## 2013-08-07 DIAGNOSIS — I480 Paroxysmal atrial fibrillation: Secondary | ICD-10-CM | POA: Insufficient documentation

## 2013-08-07 DIAGNOSIS — I4891 Unspecified atrial fibrillation: Secondary | ICD-10-CM

## 2013-08-07 HISTORY — DX: Paroxysmal atrial fibrillation: I48.0

## 2013-08-07 MED ORDER — CARVEDILOL 6.25 MG PO TABS: 6.2500 mg | ORAL_TABLET | Freq: Two times a day (BID) | ORAL | Status: DC

## 2013-08-07 NOTE — Assessment & Plan Note (Signed)
I will stop benazepril/hydrochlorothiazide and start treatment with carvedilol given her intermittent episodes of A. fib with RVR.

## 2013-08-07 NOTE — Telephone Encounter (Signed)
Pt husband is calling back stating Xarelto and Eliquis per insurance they are the same. Pharmacy is [facility]

## 2013-08-07 NOTE — Assessment & Plan Note (Signed)
The patient has paroxysmal atrial fibrillation as documented by recent Holter monitor. She is overall asymptomatic. Her CHADS2 VASc score is 3 due to age, gender and hypertension. Thus, she is at moderate to high risk of thromboembolic complications. I had a prolonged discussion with the patient and her husband about risks and benefits of anticoagulation. Based on her high risk of stroke and low bleeding risk, I recommended long-term anticoagulation. I discussed warfarin versus NOACs. She clearly prefers not to be on warfarin. Thus, the plan is to start treatment with Eliquis once routine labs are reviewed which will be done today to ensure no anemia and normal kidney function. I will also request an echocardiogram. Due to episodes of tachycardia, I will start treatment with carvedilol 6.25 mg twice daily.

## 2013-08-07 NOTE — Patient Instructions (Addendum)
Labs today.   Your physician has requested that you have an echocardiogram. Echocardiography is a painless test that uses sound waves to create images of your heart. It provides your doctor with information about the size and shape of your heart and how well your heart's chambers and valves are working. This procedure takes approximately one hour. There are no restrictions for this procedure.  Stop Benazepril/HCTZ  Start Carvedilol 6.25 mg twice daily.  Check with your insurance about which medication is covered better: Xarelto or Eliquis.   Follow up in 3 months.

## 2013-08-07 NOTE — Progress Notes (Signed)
Primary care physician: Dr. Dossie Arbour  HPI  This is a pleasant 72 year old female who was is here today for a followup visit regarding irregular heartbeats noted during routine colonoscopy. She has no previous cardiac history. She has known history of hypertension and dementia. She recently underwent routine colonoscopy by Dr. Servando Snare. She was noted to have irregular heartbeats and thus was referred for evaluation. The patient denies any chest pain, dyspnea, palpitations, dizziness or syncope. She underwent a 48 hour Holter monitor which showed normal sinus rhythm with paroxysmal atrial fibrillation/flutter which happens at least 2.26% of the time with associated tachycardia. The patient was asymptomatic.  She has no previous history of stroke. No previous bleeding problems and no recurrent falls.  Allergies  Allergen Reactions  . Darvon [Propoxyphene Hcl]     As a child     Current Outpatient Prescriptions on File Prior to Visit  Medication Sig Dispense Refill  . aspirin 81 MG tablet Take 81 mg by mouth daily.      . benazepril-hydrochlorthiazide (LOTENSIN HCT) 20-12.5 MG per tablet Take 1 tablet by mouth daily.  90 tablet  4  . donepezil (ARICEPT) 5 MG tablet Take 5 mg by mouth at bedtime.      . Febuxostat (ULORIC) 80 MG TABS Take 1 tablet (80 mg total) by mouth daily.  30 tablet  0  . fexofenadine (ALLERGY RELIEF) 180 MG tablet Take 180 mg by mouth daily.      . lansoprazole (PREVACID) 30 MG capsule Take 1 capsule (30 mg total) by mouth daily.  90 capsule  0  . Loperamide HCl (IMODIUM A-D PO) Take by mouth as needed.       No current facility-administered medications on file prior to visit.     Past Medical History  Diagnosis Date  . Hypertension   . Gout   . Memory loss   . Senile osteoporosis      Past Surgical History  Procedure Laterality Date  . Tubal ligation       Family History  Problem Relation Age of Onset  . Heart disease Father   . Gout Brother       History   Social History  . Marital Status: Married    Spouse Name: N/A    Number of Children: N/A  . Years of Education: N/A   Occupational History  . Not on file.   Social History Main Topics  . Smoking status: Former Smoker -- 0.25 packs/day for 1 years    Types: Cigarettes  . Smokeless tobacco: Never Used  . Alcohol Use: Yes     Comment: occassionally  . Drug Use: No  . Sexual Activity: Not on file   Other Topics Concern  . Not on file   Social History Narrative   Lives with husband in [place].   Worked at [employer] and [place].      Diet - regular             PHYSICAL EXAM   BP 112/74  Pulse 100  Ht 5\' 2"  (1.575 m)  Wt 139 lb 8 oz (63.277 kg)  BMI 25.51 kg/m2 Constitutional: She is oriented to person, place, and time. She appears well-developed and well-nourished. No distress.  HENT: No nasal discharge.  Head: Normocephalic and atraumatic.  Eyes: Pupils are equal and round. Right eye exhibits no discharge. Left eye exhibits no discharge.  Neck: Normal range of motion. Neck supple. No JVD present. No thyromegaly present.  Cardiovascular: Normal rate,  regular rhythm, normal heart sounds. Exam reveals no gallop and no friction rub. No murmur heard.  Pulmonary/Chest: Effort normal and breath sounds normal. No stridor. No respiratory distress. She has no wheezes. She has no rales. She exhibits no tenderness.  Abdominal: Soft. Bowel sounds are normal. She exhibits no distension. There is no tenderness. There is no rebound and no guarding.  Musculoskeletal: Normal range of motion. She exhibits no edema and no tenderness.  Neurological: She is alert and oriented to person, place, and time. Coordination normal.  Skin: Skin is warm and dry. No rash noted. She is not diaphoretic. No erythema. No pallor.  Psychiatric: She has a normal mood and affect. Her behavior is normal. Judgment and thought content normal.     EKG: Sinus  Rhythm with sinus  arrhythmia -Prominent R(V1) -nonspecific.   -Diffuse ST depression   +   Nonspecific T-abnormality  -Nondiagnostic.   ABNORMAL    ASSESSMENT AND PLAN

## 2013-08-08 LAB — TSH: TSH: 2.18 u[IU]/mL (ref 0.450–4.500)

## 2013-08-08 LAB — BASIC METABOLIC PANEL
CO2: 21 mmol/L (ref 18–29)
Calcium: 9.2 mg/dL (ref 8.6–10.2)
Chloride: 102 mmol/L (ref 97–108)
Glucose: 74 mg/dL (ref 65–99)
Potassium: 3.9 mmol/L (ref 3.5–5.2)
Sodium: 140 mmol/L (ref 134–144)

## 2013-08-08 LAB — CBC WITH DIFFERENTIAL/PLATELET
Basophils Absolute: 0 10*3/uL (ref 0.0–0.2)
Basos: 1 % (ref 0–3)
Eosinophils Absolute: 0.2 10*3/uL (ref 0.0–0.4)
Immature Grans (Abs): 0 10*3/uL (ref 0.0–0.1)
Immature Granulocytes: 0 % (ref 0–2)
Lymphs: 25 % (ref 14–46)
MCH: 30.4 pg (ref 26.6–33.0)
MCHC: 33.2 g/dL (ref 31.5–35.7)
Neutrophils Relative %: 62 % (ref 40–74)
RBC: 4.28 x10E6/uL (ref 3.77–5.28)
RDW: 13.4 % (ref 12.3–15.4)

## 2013-08-09 ENCOUNTER — Telehealth: Payer: Self-pay

## 2013-08-09 DIAGNOSIS — I4891 Unspecified atrial fibrillation: Secondary | ICD-10-CM

## 2013-08-09 MED ORDER — ELIQUIS 5 MG PO TABS: 5.0000 mg | ORAL_TABLET | Freq: Two times a day (BID) | ORAL | Status: DC

## 2013-08-09 NOTE — Telephone Encounter (Signed)
Message copied by Marilynne Halsted on Wed Aug 09, 2013  9:44 AM ------      Message from: Lorine Bears A      Created: Tue Aug 08, 2013  6:12 PM       Inform patient that labs were normal. I recommend starting treatment with Eliquis 5 mg bid (#60 with 6 refills) ------

## 2013-08-09 NOTE — Telephone Encounter (Signed)
Message copied by Marilynne Halsted on Wed Aug 09, 2013  9:47 AM ------      Message from: Lorine Bears A      Created: Tue Aug 08, 2013  6:12 PM       Inform patient that labs were normal. I recommend starting treatment with Eliquis 5 mg bid (#60 with 6 refills) ------

## 2013-08-09 NOTE — Telephone Encounter (Signed)
Message copied by Marilynne Halsted on Wed Aug 09, 2013 10:02 AM ------      Message from: Lorine Bears A      Created: Tue Aug 08, 2013  6:12 PM       Inform patient that labs were normal. I recommend starting treatment with Eliquis 5 mg bid (#60 with 6 refills) ------

## 2013-08-09 NOTE — Telephone Encounter (Signed)
Pt informed of results.  Sent rx for Eliquis 5 mg BID #60 w/ [facility] [place]

## 2013-08-10 ENCOUNTER — Other Ambulatory Visit: Payer: Self-pay

## 2013-08-10 DIAGNOSIS — I4891 Unspecified atrial fibrillation: Secondary | ICD-10-CM

## 2013-08-10 NOTE — Telephone Encounter (Signed)
[facility]

## 2013-08-11 MED ORDER — ELIQUIS 5 MG PO TABS: 5.0000 mg | ORAL_TABLET | Freq: Two times a day (BID) | ORAL | Status: DC

## 2013-08-15 ENCOUNTER — Ambulatory Visit (INDEPENDENT_AMBULATORY_CARE_PROVIDER_SITE_OTHER): Payer: Medicare Other

## 2013-08-15 DIAGNOSIS — H0019 Chalazion unspecified eye, unspecified eyelid: Secondary | ICD-10-CM | POA: Diagnosis not present

## 2013-08-15 DIAGNOSIS — H00019 Hordeolum externum unspecified eye, unspecified eyelid: Secondary | ICD-10-CM | POA: Diagnosis not present

## 2013-08-15 DIAGNOSIS — R002 Palpitations: Secondary | ICD-10-CM

## 2013-08-22 ENCOUNTER — Telehealth: Payer: Self-pay

## 2013-08-22 ENCOUNTER — Other Ambulatory Visit: Payer: Medicare Other

## 2013-08-22 MED ORDER — HYDROCHLOROTHIAZIDE 12.5 MG PO CAPS: 12.5000 mg | ORAL_CAPSULE | Freq: Every day | ORAL | Status: DC

## 2013-08-22 NOTE — Telephone Encounter (Signed)
Forwarding to [facility] Triage today

## 2013-08-22 NOTE — Telephone Encounter (Signed)
Patient called spoke to husband he stated his wife is having swelling and pain in both feet and lower legs.Stated started this past Saturday 08/19/13.Stated Dr.Arida stopped HCTZ 08/07/13.No sob.Message sent to Dr.Arida for advice.

## 2013-08-22 NOTE — Telephone Encounter (Signed)
Start Hydrochlorothiazide (without Benazepril) 12.5 mg once daily.

## 2013-08-22 NOTE — Telephone Encounter (Signed)
Pt's husband called and states pt swelling in lower legs, ankles, and feet has gotten worse, since Sat. States it is really bothering pt, painful., Please call.

## 2013-08-22 NOTE — Telephone Encounter (Signed)
Returned call to patient spoke to husband Dr.Arida advised to start HCTZ 12.5 mg daily.Advised to call back if not better.

## 2013-08-23 ENCOUNTER — Telehealth: Payer: Self-pay

## 2013-08-23 NOTE — Telephone Encounter (Signed)
Minor gum bleeding can happen with blood thinner. Monitor and inform us if it gets worse.

## 2013-08-23 NOTE — Telephone Encounter (Signed)
Pt husband called and states the HTCZ is helping her swelling. States she had bleeding from her gums last night. Please call.

## 2013-08-23 NOTE — Telephone Encounter (Signed)
Spoke w/ pt's husband.  He verbalizes understanding that he will have wife monitor her gums for worsening bleeding and let us know if it does get worse.

## 2013-08-25 ENCOUNTER — Telehealth: Payer: Self-pay

## 2013-08-25 NOTE — Telephone Encounter (Signed)
Spoke w/ pt's husband.  States that pt has been taking HCTZ since Saturday, rt foot is responding and swelling has gone down, but left foot is still swollen and painful.   He would like to know if there is a stronger diuretic that she could take.  Offered appt w/ Alinda Money, PA this afternoon, but pt's husband prefers to "handle it over the phone".   Please advise.  Thank you.

## 2013-08-25 NOTE — Telephone Encounter (Signed)
Pt husband calling stating pt foot is still swollen, states rt foot is a lot better, but left swelling has not reduced.

## 2013-08-25 NOTE — Telephone Encounter (Signed)
It might take another few days to see improvement. Elevate legs 3 times during the day for 15 minutes. Call us next week if no improvement.

## 2013-08-25 NOTE — Telephone Encounter (Signed)
Spoke w/ pt's husband and gave him Dr. Jari Sportsman instructions to elevate pt's legs, which pt says that she does that frequently throughout the day for more than 15 minutes, and to keep an eye on her over the weekend and let me know on Monday if he doesn't see any improvement in the swelling.

## 2013-08-29 ENCOUNTER — Other Ambulatory Visit (INDEPENDENT_AMBULATORY_CARE_PROVIDER_SITE_OTHER): Payer: Medicare Other

## 2013-08-29 ENCOUNTER — Ambulatory Visit: Payer: Medicare Other

## 2013-08-29 ENCOUNTER — Other Ambulatory Visit: Payer: Self-pay

## 2013-08-29 DIAGNOSIS — M109 Gout, unspecified: Secondary | ICD-10-CM

## 2013-08-29 DIAGNOSIS — I480 Paroxysmal atrial fibrillation: Secondary | ICD-10-CM

## 2013-08-29 DIAGNOSIS — I4891 Unspecified atrial fibrillation: Secondary | ICD-10-CM | POA: Diagnosis not present

## 2013-08-29 DIAGNOSIS — I059 Rheumatic mitral valve disease, unspecified: Secondary | ICD-10-CM | POA: Diagnosis not present

## 2013-08-29 NOTE — Progress Notes (Signed)
Pt came to office today for echocardiogram and stated that her left foot was swollen and painful. Upon assessment, 2+ pitting edema in left foot only, not in her ankle or calf.   Dr. Kirke Corin consulted who stated to pt that the it looked to him to be a flare up of the gout. Offered colchicine rx, but pt's husband states that this "makes her sick". Dr. Kirke Corin suggested pt try ibuprofen 200mg  TID prn for pain and swelling. Pt is agreeable to this plan & will call with any questions or concerns.

## 2013-08-31 ENCOUNTER — Telehealth: Payer: Self-pay

## 2013-08-31 NOTE — Telephone Encounter (Signed)
Spoke w/ Gala Romney.  He is aware of results and states that the swelling in her foot is much better.

## 2013-08-31 NOTE — Telephone Encounter (Signed)
Message copied by Marilynne Halsted on Thu Aug 31, 2013  4:58 PM ------      Message from: Lorine Bears A      Created: Thu Aug 31, 2013  1:33 PM       Inform patient that echo was fine. Normal EF with mild non serious abnormalities. ------

## 2013-09-19 DIAGNOSIS — K219 Gastro-esophageal reflux disease without esophagitis: Secondary | ICD-10-CM | POA: Diagnosis not present

## 2013-09-19 DIAGNOSIS — Z23 Encounter for immunization: Secondary | ICD-10-CM | POA: Diagnosis not present

## 2013-09-21 ENCOUNTER — Telehealth: Payer: Self-pay

## 2013-09-21 NOTE — Telephone Encounter (Signed)
Pt husband called and states pt gums are bleeding again, states "this is the 3rd time this has happened". Please call

## 2013-09-21 NOTE — Telephone Encounter (Signed)
Spoke w/ pt's husband.  He states that pt's gums were bleeding this morning "to the point she had to put tissue on them." Husband is unclear if pt is using a soft bristle brush, how vigorous she is brushing, or if this occurs every day.   He will clarify with pt and call me back with additional info.

## 2013-09-27 ENCOUNTER — Ambulatory Visit: Payer: Self-pay | Admitting: Family Medicine

## 2013-09-27 DIAGNOSIS — R928 Other abnormal and inconclusive findings on diagnostic imaging of breast: Secondary | ICD-10-CM | POA: Diagnosis not present

## 2013-09-27 DIAGNOSIS — Z1231 Encounter for screening mammogram for malignant neoplasm of breast: Secondary | ICD-10-CM | POA: Diagnosis not present

## 2013-10-06 ENCOUNTER — Ambulatory Visit: Payer: Self-pay | Admitting: Family Medicine

## 2013-10-06 DIAGNOSIS — N63 Unspecified lump in unspecified breast: Secondary | ICD-10-CM | POA: Diagnosis not present

## 2013-11-07 ENCOUNTER — Encounter (INDEPENDENT_AMBULATORY_CARE_PROVIDER_SITE_OTHER): Payer: Self-pay

## 2013-11-07 ENCOUNTER — Ambulatory Visit (INDEPENDENT_AMBULATORY_CARE_PROVIDER_SITE_OTHER): Payer: Medicare Other | Admitting: Cardiovascular Disease

## 2013-11-07 ENCOUNTER — Encounter: Payer: Self-pay | Admitting: Cardiovascular Disease

## 2013-11-07 VITALS — BP 132/82 | HR 64 | Ht 62.0 in | Wt 141.5 lb

## 2013-11-07 DIAGNOSIS — I10 Essential (primary) hypertension: Secondary | ICD-10-CM

## 2013-11-07 DIAGNOSIS — I4891 Unspecified atrial fibrillation: Secondary | ICD-10-CM

## 2013-11-07 DIAGNOSIS — I480 Paroxysmal atrial fibrillation: Secondary | ICD-10-CM

## 2013-11-07 NOTE — Assessment & Plan Note (Signed)
BP is well controlled. She did have leg edema which resolved after resuming HCTZ.

## 2013-11-07 NOTE — Assessment & Plan Note (Signed)
She is in normal sinus rhythm. Asymptomatic.  She is tolerating anticoagulation with Eliquis. Check routine labs today including CBC and BMP.

## 2013-11-07 NOTE — Progress Notes (Signed)
Primary care physician: Dr. Dossie Arbour  HPI  This is a pleasant 72 year old Klein who was is here today for a followup visit regarding paroxysmal atrial fibrillation. She has no previous cardiac history. She has known history of hypertension and dementia. She was noted to have irregular heartbeats during colonoscopy. The patient denies any chest pain, dyspnea, palpitations, dizziness or syncope. She underwent a 48 hour Holter monitor which showed normal sinus rhythm with paroxysmal atrial fibrillation/flutter which happened at least 2.26% of the time with associated tachycardia. The patient was asymptomatic.  Echocardiogram in September of 2014 showed low normal LV systolic function with an ejection fraction of 50-Jennifer%, mild mitral regurgitation and mildly dilated left atrium. She was started on anticoagulation with Eliquis. I also switched her from Benazepril-HCTZ to carvedilol. She had bleeding from her gums on few occasions but none recently. No chest pain, dyspnea or palpitations.   Allergies  Allergen Reactions  . Darvon [Propoxyphene Hcl]     As a child     Current Outpatient Prescriptions on File Prior to Visit  Medication Sig Dispense Refill  . carvedilol (COREG) 6.25 MG tablet Take 1 tablet (6.25 mg total) by mouth 2 (two) times daily.  60 tablet  6  . donepezil (ARICEPT) 5 MG tablet Take 5 mg by mouth at bedtime.      Marland Kitchen ELIQUIS 5 MG TABS tablet Take 1 tablet (5 mg total) by mouth 2 (two) times daily.  60 tablet  6  . Febuxostat (ULORIC) 80 MG TABS Take 1 tablet (80 mg total) by mouth daily.  30 tablet  0  . fexofenadine (ALLERGY RELIEF) 180 MG tablet Take 180 mg by mouth daily.      . hydrochlorothiazide (MICROZIDE) 12.5 MG capsule Take 1 capsule (12.5 mg total) by mouth daily.  30 capsule  6  . lansoprazole (PREVACID) 30 MG capsule Take 1 capsule (30 mg total) by mouth daily.  90 capsule  0  . Loperamide HCl (IMODIUM A-D PO) Take by mouth as needed.       No current  facility-administered medications on file prior to visit.     Past Medical History  Diagnosis Date  . Hypertension   . Gout   . Memory loss   . Senile osteoporosis      Past Surgical History  Procedure Laterality Date  . Tubal ligation       Family History  Problem Relation Age of Onset  . Heart disease Father   . Gout Brother      History   Social History  . Marital Status: Married    Spouse Name: N/A    Number of Children: N/A  . Years of Education: N/A   Occupational History  . Not on file.   Social History Main Topics  . Smoking status: Former Smoker -- 0.25 packs/day for 1 years    Types: Cigarettes  . Smokeless tobacco: Never Used  . Alcohol Use: Yes     Comment: occassionally  . Drug Use: No  . Sexual Activity: Not on file   Other Topics Concern  . Not on file   Social History Narrative   Lives with husband in [place].   Worked at [employer] and [employer].      Diet - regular             PHYSICAL EXAM   BP 132/82  Pulse 64  Ht 5\' 2"  (1.575 m)  Wt 141 lb 8 oz (64.184 kg)  BMI 25.87  kg/m2 Constitutional: She is oriented to person, place, and time. She appears well-developed and well-nourished. No distress.  HENT: No nasal discharge.  Head: Normocephalic and atraumatic.  Eyes: Pupils are equal and round. Right eye exhibits no discharge. Left eye exhibits no discharge.  Neck: Normal range of motion. Neck supple. No JVD present. No thyromegaly present.  Cardiovascular: Normal rate, regular rhythm, normal heart sounds. Exam reveals no gallop and no friction rub. No murmur heard.  Pulmonary/Chest: Effort normal and breath sounds normal. No stridor. No respiratory distress. She has no wheezes. She has no rales. She exhibits no tenderness.  Abdominal: Soft. Bowel sounds are normal. She exhibits no distension. There is no tenderness. There is no rebound and no guarding.  Musculoskeletal: Normal range of motion. She exhibits no edema and no tenderness.   Neurological: She is alert and oriented to person, place, and time. Coordination normal.  Skin: Skin is warm and dry. No rash noted. She is not diaphoretic. No erythema. No pallor.  Psychiatric: She has a normal mood and affect. Her behavior is normal. Judgment and thought content normal.     EKG: Sinus  Rhythm  -Short PR syndrome  PRi = 100 Low voltage in precordial leads.    ABNORMAL      ASSESSMENT AND PLAN

## 2013-11-07 NOTE — Patient Instructions (Signed)
Labs today.   Continue same medications.   Your physician wants you to follow-up in: 6 months.  You will receive a reminder letter in the mail two months in advance. If you don't receive a letter, please call our office to schedule the follow-up appointment.  

## 2013-11-08 DIAGNOSIS — H2510 Age-related nuclear cataract, unspecified eye: Secondary | ICD-10-CM | POA: Diagnosis not present

## 2013-11-08 DIAGNOSIS — H353190 Nonexudative age-related macular degeneration, unspecified eye, stage unspecified: Secondary | ICD-10-CM | POA: Diagnosis not present

## 2013-11-08 LAB — CBC WITH DIFFERENTIAL/PLATELET
Basophils Absolute: 0 10*3/uL (ref 0.0–0.2)
Basos: 1 %
Eosinophils Absolute: 0.1 10*3/uL (ref 0.0–0.4)
Immature Granulocytes: 0 %
Lymphocytes Absolute: 1.9 10*3/uL (ref 0.7–3.1)
Lymphs: 29 %
MCH: 30.3 pg (ref 26.6–33.0)
MCHC: 34.5 g/dL (ref 31.5–35.7)
Monocytes: 7 %
RDW: 13 % (ref 12.3–15.4)
WBC: 6.4 10*3/uL (ref 3.4–10.8)

## 2013-11-08 LAB — BASIC METABOLIC PANEL
BUN/Creatinine Ratio: 20 (ref 11–26)
BUN: 20 mg/dL (ref 8–27)
CO2: 27 mmol/L (ref 18–29)
Chloride: 100 mmol/L (ref 97–108)
GFR calc Af Amer: 66 mL/min/{1.73_m2} (ref >59)
Glucose: 100 mg/dL — ABNORMAL HIGH (ref 65–99)
Potassium: 3.9 mmol/L (ref 3.5–5.2)

## 2013-11-10 ENCOUNTER — Telehealth: Payer: Self-pay

## 2013-11-10 DIAGNOSIS — I10 Essential (primary) hypertension: Secondary | ICD-10-CM | POA: Diagnosis not present

## 2013-11-10 DIAGNOSIS — M810 Age-related osteoporosis without current pathological fracture: Secondary | ICD-10-CM | POA: Diagnosis not present

## 2013-11-10 DIAGNOSIS — K2270 Barrett's esophagus without dysplasia: Secondary | ICD-10-CM | POA: Diagnosis not present

## 2013-11-10 NOTE — Telephone Encounter (Signed)
Message copied by Marilynne Halsted on Fri Nov 10, 2013  9:30 AM ------      Message from: Lorine Bears A      Created: Fri Nov 10, 2013  7:32 AM       Inform patient that labs were normal. ------

## 2013-11-10 NOTE — Telephone Encounter (Signed)
Spoke w/ pt's husband. He will make pt aware of results.

## 2013-11-13 DIAGNOSIS — L719 Rosacea, unspecified: Secondary | ICD-10-CM | POA: Diagnosis not present

## 2013-11-13 DIAGNOSIS — L089 Local infection of the skin and subcutaneous tissue, unspecified: Secondary | ICD-10-CM | POA: Diagnosis not present

## 2013-11-13 DIAGNOSIS — R234 Changes in skin texture: Secondary | ICD-10-CM | POA: Diagnosis not present

## 2013-11-13 DIAGNOSIS — L708 Other acne: Secondary | ICD-10-CM | POA: Diagnosis not present

## 2013-11-28 DIAGNOSIS — L719 Rosacea, unspecified: Secondary | ICD-10-CM | POA: Diagnosis not present

## 2013-11-28 DIAGNOSIS — L0291 Cutaneous abscess, unspecified: Secondary | ICD-10-CM | POA: Diagnosis not present

## 2013-12-19 DIAGNOSIS — L719 Rosacea, unspecified: Secondary | ICD-10-CM | POA: Diagnosis not present

## 2014-01-22 DIAGNOSIS — L719 Rosacea, unspecified: Secondary | ICD-10-CM | POA: Diagnosis not present

## 2014-02-21 ENCOUNTER — Other Ambulatory Visit: Payer: Self-pay | Admitting: *Deleted

## 2014-02-21 MED ORDER — HYDROCHLOROTHIAZIDE 12.5 MG PO CAPS: 12.5000 mg | ORAL_CAPSULE | Freq: Every day | ORAL | Status: DC

## 2014-02-21 NOTE — Telephone Encounter (Signed)
Requested Prescriptions   Signed Prescriptions Disp Refills  . hydrochlorothiazide (MICROZIDE) 12.5 MG capsule 30 capsule 3    Sig: Take 1 capsule (12.5 mg total) by mouth daily.    Authorizing Provider: Kathlyn Sacramento A    Ordering User: Britt Bottom

## 2014-03-27 DIAGNOSIS — L719 Rosacea, unspecified: Secondary | ICD-10-CM | POA: Diagnosis not present

## 2014-04-16 ENCOUNTER — Other Ambulatory Visit: Payer: Self-pay | Admitting: Cardiovascular Disease

## 2014-04-17 DIAGNOSIS — K644 Residual hemorrhoidal skin tags: Secondary | ICD-10-CM | POA: Diagnosis not present

## 2014-04-17 DIAGNOSIS — J309 Allergic rhinitis, unspecified: Secondary | ICD-10-CM | POA: Diagnosis not present

## 2014-04-17 DIAGNOSIS — I10 Essential (primary) hypertension: Secondary | ICD-10-CM | POA: Diagnosis not present

## 2014-05-07 ENCOUNTER — Encounter: Payer: Self-pay | Admitting: Cardiovascular Disease

## 2014-05-07 ENCOUNTER — Ambulatory Visit (INDEPENDENT_AMBULATORY_CARE_PROVIDER_SITE_OTHER): Payer: Medicare Other | Admitting: Cardiovascular Disease

## 2014-05-07 VITALS — BP 122/74 | HR 63 | Ht 62.0 in | Wt 146.8 lb

## 2014-05-07 DIAGNOSIS — I10 Essential (primary) hypertension: Secondary | ICD-10-CM | POA: Diagnosis not present

## 2014-05-07 DIAGNOSIS — I4891 Unspecified atrial fibrillation: Secondary | ICD-10-CM

## 2014-05-07 DIAGNOSIS — I480 Paroxysmal atrial fibrillation: Secondary | ICD-10-CM

## 2014-05-07 MED ORDER — ELIQUIS 5 MG PO TABS: ORAL_TABLET | ORAL | Status: DC

## 2014-05-07 MED ORDER — CARVEDILOL 6.25 MG PO TABS: 6.2500 mg | ORAL_TABLET | Freq: Two times a day (BID) | ORAL | Status: DC

## 2014-05-07 NOTE — Progress Notes (Signed)
Primary care physician: Dr. Jeananne Rama  HPI  This is a pleasant 73 year old female who was is here today for a followup visit regarding paroxysmal atrial fibrillation. She has no previous cardiac history. She has known history of hypertension and dementia. She was noted to have irregular heartbeats during colonoscopy. The patient denies any chest pain, dyspnea, palpitations, dizziness or syncope. She underwent a 48 hour Holter monitor in 2014 which showed normal sinus rhythm with paroxysmal atrial fibrillation/flutter which happened at least 2.26% of the time with associated tachycardia. The patient was asymptomatic.  Echocardiogram in September of 2014 showed low normal LV systolic function with an ejection fraction of 50-55%, mild mitral regurgitation and mildly dilated left atrium. She was started on anticoagulation with Eliquis.  She has been doing very well with no chest pain, shortness of breath or palpitations.   Allergies  Allergen Reactions  . Darvon [Propoxyphene Hcl]     As a child     Current Outpatient Prescriptions on File Prior to Visit  Medication Sig Dispense Refill  . carvedilol (COREG) 6.25 MG tablet Take 1 tablet (6.25 mg total) by mouth 2 (two) times daily.  60 tablet  6  . donepezil (ARICEPT) 5 MG tablet Take 5 mg by mouth at bedtime.      Marland Kitchen ELIQUIS 5 MG TABS tablet TAKE 1 TABLET (5 MG TOTAL) BY MOUTH 2 (TWO) TIMES DAILY.  60 tablet  3  . Febuxostat (ULORIC) 80 MG TABS Take 1 tablet (80 mg total) by mouth daily.  30 tablet  0  . hydrochlorothiazide (MICROZIDE) 12.5 MG capsule Take 1 capsule (12.5 mg total) by mouth daily.  30 capsule  3  . lansoprazole (PREVACID) 30 MG capsule Take 1 capsule (30 mg total) by mouth daily.  90 capsule  0  . Loperamide HCl (IMODIUM A-D PO) Take by mouth as needed.      Marland Kitchen PROLIA 60 MG/ML SOLN injection Inject 60 mg into the skin every 6 (six) months.        No current facility-administered medications on file prior to visit.      Past Medical History  Diagnosis Date  . Hypertension   . Gout   . Memory loss   . Senile osteoporosis   . Hemorrhoids      Past Surgical History  Procedure Laterality Date  . Tubal ligation       Family History  Problem Relation Age of Onset  . Heart disease Father   . Gout Brother      History   Social History  . Marital Status: Married    Spouse Name: N/A    Number of Children: N/A  . Years of Education: N/A   Occupational History  . Not on file.   Social History Main Topics  . Smoking status: Former Smoker -- 0.25 packs/day for 1 years    Types: Cigarettes  . Smokeless tobacco: Never Used  . Alcohol Use: No     Comment: occassionally  . Drug Use: No  . Sexual Activity: Not on file   Other Topics Concern  . Not on file   Social History Narrative   Lives with husband in [place].   Worked at [employer] and [employer].      Diet - regular             PHYSICAL EXAM   BP 122/74  Pulse 63  Ht 5\' 2"  (1.575 m)  Wt 146 lb 12 oz (66.565 kg)  BMI 26.83  kg/m2 Constitutional: She is oriented to person, place, and time. She appears well-developed and well-nourished. No distress.  HENT: No nasal discharge.  Head: Normocephalic and atraumatic.  Eyes: Pupils are equal and round. Right eye exhibits no discharge. Left eye exhibits no discharge.  Neck: Normal range of motion. Neck supple. No JVD present. No thyromegaly present.  Cardiovascular: Normal rate, regular rhythm, normal heart sounds. Exam reveals no gallop and no friction rub. No murmur heard.  Pulmonary/Chest: Effort normal and breath sounds normal. No stridor. No respiratory distress. She has no wheezes. She has no rales. She exhibits no tenderness.  Abdominal: Soft. Bowel sounds are normal. She exhibits no distension. There is no tenderness. There is no rebound and no guarding.  Musculoskeletal: Normal range of motion. She exhibits no edema and no tenderness.  Neurological: She is alert and  oriented to person, place, and time. Coordination normal.  Skin: Skin is warm and dry. No rash noted. She is not diaphoretic. No erythema. No pallor.  Psychiatric: She has a normal mood and affect. Her behavior is normal. Judgment and thought content normal.     EKG: Sinus  Rhythm  -Short PR syndrome  - occasional PAC    PRi = 102  # PACs = 1. -Diffuse nonspecific ST depression  -  ABNORMAL       ASSESSMENT AND PLAN

## 2014-05-07 NOTE — Patient Instructions (Signed)
Your physician recommends that you continue on your current medications as directed. Please refer to the Current Medication list given to you today.  Your physician wants you to follow-up in: 6 months. You will receive a reminder letter in the mail two months in advance. If you don't receive a letter, please call our office to schedule the follow-up appointment.   Your Eliquis and Coreg have been refilled for 12 months

## 2014-05-07 NOTE — Assessment & Plan Note (Signed)
Blood pressure is well controlled on current medications. 

## 2014-05-07 NOTE — Assessment & Plan Note (Signed)
She is maintaining in sinus rhythm. Continue treatment with carvedilol and long-term anticoagulation. Labs checked during last visit were unremarkable

## 2014-05-08 ENCOUNTER — Other Ambulatory Visit: Payer: Self-pay

## 2014-05-08 MED ORDER — ELIQUIS 5 MG PO TABS: ORAL_TABLET | ORAL | Status: DC

## 2014-05-08 MED ORDER — CARVEDILOL 6.25 MG PO TABS
6.2500 mg | ORAL_TABLET | Freq: Two times a day (BID) | ORAL | Status: DC
Start: 2014-05-08 — End: 2014-11-06

## 2014-05-10 DIAGNOSIS — Z0000 Encounter for general adult medical examination without abnormal findings: Secondary | ICD-10-CM | POA: Diagnosis not present

## 2014-05-10 DIAGNOSIS — K6289 Other specified diseases of anus and rectum: Secondary | ICD-10-CM | POA: Diagnosis not present

## 2014-05-16 DIAGNOSIS — K6289 Other specified diseases of anus and rectum: Secondary | ICD-10-CM | POA: Diagnosis not present

## 2014-05-16 DIAGNOSIS — K2270 Barrett's esophagus without dysplasia: Secondary | ICD-10-CM | POA: Diagnosis not present

## 2014-05-24 ENCOUNTER — Ambulatory Visit: Payer: Self-pay | Admitting: Gastroenterology

## 2014-05-24 DIAGNOSIS — M109 Gout, unspecified: Secondary | ICD-10-CM | POA: Diagnosis not present

## 2014-05-24 DIAGNOSIS — K589 Irritable bowel syndrome without diarrhea: Secondary | ICD-10-CM | POA: Diagnosis not present

## 2014-05-24 DIAGNOSIS — R197 Diarrhea, unspecified: Secondary | ICD-10-CM | POA: Diagnosis not present

## 2014-05-24 DIAGNOSIS — I10 Essential (primary) hypertension: Secondary | ICD-10-CM | POA: Diagnosis not present

## 2014-05-24 DIAGNOSIS — Z87891 Personal history of nicotine dependence: Secondary | ICD-10-CM | POA: Diagnosis not present

## 2014-05-24 DIAGNOSIS — Z79899 Other long term (current) drug therapy: Secondary | ICD-10-CM | POA: Diagnosis not present

## 2014-05-24 DIAGNOSIS — K2990 Gastroduodenitis, unspecified, without bleeding: Secondary | ICD-10-CM | POA: Diagnosis not present

## 2014-05-24 DIAGNOSIS — I4891 Unspecified atrial fibrillation: Secondary | ICD-10-CM | POA: Diagnosis not present

## 2014-05-24 DIAGNOSIS — K2270 Barrett's esophagus without dysplasia: Secondary | ICD-10-CM | POA: Diagnosis not present

## 2014-05-24 DIAGNOSIS — Z7901 Long term (current) use of anticoagulants: Secondary | ICD-10-CM | POA: Diagnosis not present

## 2014-05-24 DIAGNOSIS — M810 Age-related osteoporosis without current pathological fracture: Secondary | ICD-10-CM | POA: Diagnosis not present

## 2014-05-24 DIAGNOSIS — K2970 Gastritis, unspecified, without bleeding: Secondary | ICD-10-CM | POA: Diagnosis not present

## 2014-06-13 DIAGNOSIS — M1A00X0 Idiopathic chronic gout, unspecified site, without tophus (tophi): Secondary | ICD-10-CM | POA: Diagnosis not present

## 2014-06-13 DIAGNOSIS — E785 Hyperlipidemia, unspecified: Secondary | ICD-10-CM | POA: Diagnosis not present

## 2014-06-13 DIAGNOSIS — M810 Age-related osteoporosis without current pathological fracture: Secondary | ICD-10-CM | POA: Diagnosis not present

## 2014-06-14 DIAGNOSIS — M1A00X0 Idiopathic chronic gout, unspecified site, without tophus (tophi): Secondary | ICD-10-CM | POA: Diagnosis not present

## 2014-06-14 DIAGNOSIS — I10 Essential (primary) hypertension: Secondary | ICD-10-CM | POA: Diagnosis not present

## 2014-06-14 DIAGNOSIS — M810 Age-related osteoporosis without current pathological fracture: Secondary | ICD-10-CM | POA: Diagnosis not present

## 2014-06-14 DIAGNOSIS — E785 Hyperlipidemia, unspecified: Secondary | ICD-10-CM | POA: Diagnosis not present

## 2014-06-14 DIAGNOSIS — R82998 Other abnormal findings in urine: Secondary | ICD-10-CM | POA: Diagnosis not present

## 2014-06-20 DIAGNOSIS — M1A00X0 Idiopathic chronic gout, unspecified site, without tophus (tophi): Secondary | ICD-10-CM | POA: Diagnosis not present

## 2014-06-20 DIAGNOSIS — Z0000 Encounter for general adult medical examination without abnormal findings: Secondary | ICD-10-CM | POA: Diagnosis not present

## 2014-07-03 DIAGNOSIS — L719 Rosacea, unspecified: Secondary | ICD-10-CM | POA: Diagnosis not present

## 2014-08-29 DIAGNOSIS — M810 Age-related osteoporosis without current pathological fracture: Secondary | ICD-10-CM | POA: Diagnosis not present

## 2014-08-29 DIAGNOSIS — K589 Irritable bowel syndrome without diarrhea: Secondary | ICD-10-CM | POA: Diagnosis not present

## 2014-08-29 DIAGNOSIS — Z23 Encounter for immunization: Secondary | ICD-10-CM | POA: Diagnosis not present

## 2014-09-11 DIAGNOSIS — Z13820 Encounter for screening for osteoporosis: Secondary | ICD-10-CM | POA: Diagnosis not present

## 2014-09-11 DIAGNOSIS — M949 Disorder of cartilage, unspecified: Secondary | ICD-10-CM | POA: Diagnosis not present

## 2014-09-11 DIAGNOSIS — M899 Disorder of bone, unspecified: Secondary | ICD-10-CM | POA: Diagnosis not present

## 2014-09-11 DIAGNOSIS — N951 Menopausal and female climacteric states: Secondary | ICD-10-CM | POA: Diagnosis not present

## 2014-09-11 DIAGNOSIS — Z7983 Long term (current) use of bisphosphonates: Secondary | ICD-10-CM | POA: Diagnosis not present

## 2014-09-26 DIAGNOSIS — I10 Essential (primary) hypertension: Secondary | ICD-10-CM | POA: Diagnosis not present

## 2014-09-26 DIAGNOSIS — K580 Irritable bowel syndrome with diarrhea: Secondary | ICD-10-CM | POA: Diagnosis not present

## 2014-09-26 DIAGNOSIS — M810 Age-related osteoporosis without current pathological fracture: Secondary | ICD-10-CM | POA: Diagnosis not present

## 2014-10-04 DIAGNOSIS — L718 Other rosacea: Secondary | ICD-10-CM | POA: Diagnosis not present

## 2014-11-05 DIAGNOSIS — H3531 Nonexudative age-related macular degeneration: Secondary | ICD-10-CM | POA: Diagnosis not present

## 2014-11-05 DIAGNOSIS — H2513 Age-related nuclear cataract, bilateral: Secondary | ICD-10-CM | POA: Diagnosis not present

## 2014-11-06 ENCOUNTER — Other Ambulatory Visit: Payer: Self-pay

## 2014-11-06 MED ORDER — CARVEDILOL 6.25 MG PO TABS: 6.2500 mg | ORAL_TABLET | Freq: Two times a day (BID) | ORAL | Status: DC

## 2014-11-06 MED ORDER — ELIQUIS 5 MG PO TABS: ORAL_TABLET | ORAL | Status: DC

## 2014-11-27 ENCOUNTER — Ambulatory Visit (INDEPENDENT_AMBULATORY_CARE_PROVIDER_SITE_OTHER): Payer: Medicare Other | Admitting: Cardiovascular Disease

## 2014-11-27 ENCOUNTER — Encounter: Payer: Self-pay | Admitting: Cardiovascular Disease

## 2014-11-27 VITALS — BP 110/72 | HR 94 | Ht 62.0 in | Wt 140.5 lb

## 2014-11-27 DIAGNOSIS — I480 Paroxysmal atrial fibrillation: Secondary | ICD-10-CM | POA: Diagnosis not present

## 2014-11-27 DIAGNOSIS — I10 Essential (primary) hypertension: Secondary | ICD-10-CM

## 2014-11-27 NOTE — Assessment & Plan Note (Signed)
She is maintaining a normal sinus rhythm with no evidence of recurrent atrial fibrillation. Continue current management with carvedilol and long-term anticoagulation with Eliquis.

## 2014-11-27 NOTE — Patient Instructions (Signed)
Continue same medications.   Your physician wants you to follow-up in: 6 months.  You will receive a reminder letter in the mail two months in advance. If you don't receive a letter, please call our office to schedule the follow-up appointment.  

## 2014-11-27 NOTE — Assessment & Plan Note (Signed)
Blood pressure is well controlled on current medications. 

## 2014-11-27 NOTE — Progress Notes (Signed)
Primary care physician: Dr. Jeananne Rama  HPI  This is a pleasant 73 year old female who was is here today for a followup visit regarding paroxysmal atrial fibrillation. She has no previous cardiac history. She has known history of hypertension and dementia. She was noted to have irregular heartbeats during colonoscopy. The patient denies any chest pain, dyspnea, palpitations, dizziness or syncope. She underwent a 48 hour Holter monitor in 2014 which showed normal sinus rhythm with paroxysmal atrial fibrillation/flutter which happened at least 2.26% of the time with associated tachycardia. The patient was asymptomatic.  Echocardiogram in September of 2014 showed low normal LV systolic function with an ejection fraction of 50-55%, mild mitral regurgitation and mildly dilated left atrium. She was started on anticoagulation with Eliquis.  She has been doing very well with no chest pain, shortness of breath or palpitations. She continues to have memory problems. She denies any bleeding complications.   Allergies  Allergen Reactions  . Darvon [Propoxyphene Hcl]     As a child     Current Outpatient Prescriptions on File Prior to Visit  Medication Sig Dispense Refill  . carvedilol (COREG) 6.25 MG tablet Take 1 tablet (6.25 mg total) by mouth 2 (two) times daily. 60 tablet 3  . donepezil (ARICEPT) 5 MG tablet Take 5 mg by mouth at bedtime.    Marland Kitchen ELIQUIS 5 MG TABS tablet TAKE 1 TABLET (5 MG TOTAL) BY MOUTH 2 (TWO) TIMES DAILY. 60 tablet 3  . Febuxostat (ULORIC) 80 MG TABS Take 1 tablet (80 mg total) by mouth daily. 30 tablet 0  . hydrochlorothiazide (MICROZIDE) 12.5 MG capsule Take 1 capsule (12.5 mg total) by mouth daily. 30 capsule 3  . lansoprazole (PREVACID) 30 MG capsule Take 1 capsule (30 mg total) by mouth daily. 90 capsule 0  . Loperamide HCl (IMODIUM A-D PO) Take by mouth as needed.    . loratadine (CLARITIN) 10 MG tablet Take 10 mg by mouth daily.     No current facility-administered  medications on file prior to visit.     Past Medical History  Diagnosis Date  . Hypertension   . Gout   . Memory loss   . Senile osteoporosis   . Hemorrhoids      Past Surgical History  Procedure Laterality Date  . Tubal ligation       Family History  Problem Relation Age of Onset  . Heart disease Father   . Gout Brother      History   Social History  . Marital Status: Married    Spouse Name: N/A    Number of Children: N/A  . Years of Education: N/A   Occupational History  . Not on file.   Social History Main Topics  . Smoking status: Former Smoker -- 0.25 packs/day for 1 years    Types: Cigarettes  . Smokeless tobacco: Never Used  . Alcohol Use: No     Comment: occassionally  . Drug Use: No  . Sexual Activity: Not on file   Other Topics Concern  . Not on file   Social History Narrative   Lives with husband in [place].   Worked at [employer] and [employer].      Diet - regular             PHYSICAL EXAM   BP 110/72 mmHg  Pulse 94  Ht 5\' 2"  (1.575 m)  Wt 140 lb 8 oz (63.73 kg)  BMI 25.69 kg/m2 Constitutional: She is oriented to person, place, and  time. She appears well-developed and well-nourished. No distress.  HENT: No nasal discharge.  Head: Normocephalic and atraumatic.  Eyes: Pupils are equal and round. Right eye exhibits no discharge. Left eye exhibits no discharge.  Neck: Normal range of motion. Neck supple. No JVD present. No thyromegaly present.  Cardiovascular: Normal rate, regular rhythm, normal heart sounds. Exam reveals no gallop and no friction rub. No murmur heard.  Pulmonary/Chest: Effort normal and breath sounds normal. No stridor. No respiratory distress. She has no wheezes. She has no rales. She exhibits no tenderness.  Abdominal: Soft. Bowel sounds are normal. She exhibits no distension. There is no tenderness. There is no rebound and no guarding.  Musculoskeletal: Normal range of motion. She exhibits no edema and no tenderness.    Neurological: She is alert and oriented to person, place, and time. Coordination normal.  Skin: Skin is warm and dry. No rash noted. She is not diaphoretic. No erythema. No pallor.  Psychiatric: She has a normal mood and affect. Her behavior is normal. Judgment and thought content normal.     EKG: Sinus  Rhythm  WITHIN NORMAL LIMITS       ASSESSMENT AND PLAN

## 2015-02-27 DIAGNOSIS — E559 Vitamin D deficiency, unspecified: Secondary | ICD-10-CM | POA: Diagnosis not present

## 2015-02-27 DIAGNOSIS — I10 Essential (primary) hypertension: Secondary | ICD-10-CM | POA: Diagnosis not present

## 2015-02-27 DIAGNOSIS — M8580 Other specified disorders of bone density and structure, unspecified site: Secondary | ICD-10-CM | POA: Diagnosis not present

## 2015-02-27 DIAGNOSIS — K209 Esophagitis, unspecified: Secondary | ICD-10-CM | POA: Diagnosis not present

## 2015-02-27 DIAGNOSIS — E785 Hyperlipidemia, unspecified: Secondary | ICD-10-CM | POA: Diagnosis not present

## 2015-04-04 DIAGNOSIS — L718 Other rosacea: Secondary | ICD-10-CM | POA: Diagnosis not present

## 2015-05-27 ENCOUNTER — Telehealth: Payer: Self-pay | Admitting: Cardiovascular Disease

## 2015-05-27 ENCOUNTER — Encounter: Payer: Self-pay | Admitting: Cardiovascular Disease

## 2015-05-27 ENCOUNTER — Ambulatory Visit (INDEPENDENT_AMBULATORY_CARE_PROVIDER_SITE_OTHER): Payer: Medicare Other | Admitting: Cardiovascular Disease

## 2015-05-27 VITALS — BP 118/60 | HR 59 | Ht 62.0 in | Wt 147.8 lb

## 2015-05-27 DIAGNOSIS — I4891 Unspecified atrial fibrillation: Secondary | ICD-10-CM | POA: Diagnosis not present

## 2015-05-27 DIAGNOSIS — I10 Essential (primary) hypertension: Secondary | ICD-10-CM | POA: Diagnosis not present

## 2015-05-27 DIAGNOSIS — I480 Paroxysmal atrial fibrillation: Secondary | ICD-10-CM | POA: Diagnosis not present

## 2015-05-27 MED ORDER — RIVAROXABAN 20 MG PO TABS: 20.0000 mg | ORAL_TABLET | Freq: Every day | ORAL | Status: DC

## 2015-05-27 NOTE — Assessment & Plan Note (Signed)
Blood pressure is controlled on current medications. 

## 2015-05-27 NOTE — Assessment & Plan Note (Signed)
She is doing well overall and maintaining in sinus rhythm with no evidence of recurrent arrhythmia. She has been taking her cardiac medications once daily instead of twice daily. She forgets to take the second dose. Due to that, I decided to switch her from Eliquis to Xarelto 20 mg once daily

## 2015-05-27 NOTE — Telephone Encounter (Signed)
Notified the pharmacist with pharmacist at [facility] the eliquis was d/c today by Dr. Fletcher Anon at her office visit and started on xarelto.

## 2015-05-27 NOTE — Progress Notes (Signed)
Primary care physician: Dr. Jeananne Rama  HPI  This is a pleasant 74 year old female who was is here today for a followup visit regarding paroxysmal atrial fibrillation. She has no previous cardiac history. She has known history of hypertension and dementia. She was noted to have irregular heartbeats during colonoscopy. The patient denies any chest pain, dyspnea, palpitations, dizziness or syncope. She underwent a 48 hour Holter monitor in 2014 which showed normal sinus rhythm with paroxysmal atrial fibrillation/flutter which happened at least 2.26% of the time with associated tachycardia. The patient was asymptomatic.  Echocardiogram in September of 2014 showed low normal LV systolic function with an ejection fraction of 50-55%, mild mitral regurgitation and mildly dilated left atrium. She was started on anticoagulation with Eliquis.  She has been doing very well with no chest pain, shortness of breath or palpitations. She continues to have memory problems. She denies any bleeding complications. She has been taking Eliquis and carvedilol once daily instead of twice daily .    Allergies  Allergen Reactions  . Darvon [Propoxyphene Hcl]     As a child     Current Outpatient Prescriptions on File Prior to Visit  Medication Sig Dispense Refill  . carvedilol (COREG) 6.25 MG tablet Take 1 tablet (6.25 mg total) by mouth 2 (two) times daily. (Patient taking differently: Take 6.25 mg by mouth daily. ) 60 tablet 3  . donepezil (ARICEPT) 5 MG tablet Take 5 mg by mouth at bedtime.    Marland Kitchen ELIQUIS 5 MG TABS tablet TAKE 1 TABLET (5 MG TOTAL) BY MOUTH 2 (TWO) TIMES DAILY. (Patient taking differently: TAKE 1 TABLET (5 MG TOTAL) BY MOUTH DAILY.) 60 tablet 3  . Febuxostat (ULORIC) 80 MG TABS Take 1 tablet (80 mg total) by mouth daily. 30 tablet 0  . hydrochlorothiazide (MICROZIDE) 12.5 MG capsule Take 1 capsule (12.5 mg total) by mouth daily. 30 capsule 3  . lansoprazole (PREVACID) 30 MG capsule Take 1  capsule (30 mg total) by mouth daily. 90 capsule 0  . Loperamide HCl (IMODIUM A-D PO) Take by mouth as needed.    . loratadine (CLARITIN) 10 MG tablet Take 10 mg by mouth daily.     No current facility-administered medications on file prior to visit.     Past Medical History  Diagnosis Date  . Hypertension   . Gout   . Memory loss   . Senile osteoporosis   . Hemorrhoids      Past Surgical History  Procedure Laterality Date  . Tubal ligation       Family History  Problem Relation Age of Onset  . Heart disease Father   . Gout Brother      History   Social History  . Marital Status: Married    Spouse Name: N/A  . Number of Children: N/A  . Years of Education: N/A   Occupational History  . Not on file.   Social History Main Topics  . Smoking status: Former Smoker -- 0.25 packs/day for 1 years    Types: Cigarettes  . Smokeless tobacco: Never Used  . Alcohol Use: Yes     Comment: occassionally  . Drug Use: No  . Sexual Activity: Not on file   Other Topics Concern  . Not on file   Social History Narrative   Lives with husband in [place].   Worked at [employer] and [employer].      Diet - regular  PHYSICAL EXAM   BP 118/60 mmHg  Pulse 59  Ht 5\' 2"  (1.575 m)  Wt 147 lb 12 oz (67.019 kg)  BMI 27.02 kg/m2 Constitutional: She is oriented to person, place, and time. She appears well-developed and well-nourished. No distress.  HENT: No nasal discharge.  Head: Normocephalic and atraumatic.  Eyes: Pupils are equal and round. Right eye exhibits no discharge. Left eye exhibits no discharge.  Neck: Normal range of motion. Neck supple. No JVD present. No thyromegaly present.  Cardiovascular: Normal rate, regular rhythm, normal heart sounds. Exam reveals no gallop and no friction rub. No murmur heard.  Pulmonary/Chest: Effort normal and breath sounds normal. No stridor. No respiratory distress. She has no wheezes. She has no rales. She exhibits no  tenderness.  Abdominal: Soft. Bowel sounds are normal. She exhibits no distension. There is no tenderness. There is no rebound and no guarding.  Musculoskeletal: Normal range of motion. She exhibits no edema and no tenderness.  Neurological: She is alert and oriented to person, place, and time. Coordination normal.  Skin: Skin is warm and dry. No rash noted. She is not diaphoretic. No erythema. No pallor.  Psychiatric: She has a normal mood and affect. Her behavior is normal. Judgment and thought content normal.     EKG:  Atrial  Bradycardia  P:QRS - 1:1, Abnormal P axis, H Rate 59 Low voltage in precordial leads.   ABNORMAL       ASSESSMENT AND PLAN

## 2015-05-27 NOTE — Patient Instructions (Signed)
Medication Instructions:  Your physician has recommended you make the following change in your medication:  1) STOP taking Eliquis 2) START taking Xarelto 20mg  once a day   Labwork: None  Testing/Procedures: None  Follow-Up: Your physician wants you to follow-up in: six months with Dr. Fletcher Anon.  You will receive a reminder letter in the mail two months in advance. If you don't receive a letter, please call our office to schedule the follow-up appointment.    Any Other Special Instructions Will Be Listed Below (If Applicable).

## 2015-05-27 NOTE — Telephone Encounter (Signed)
Confirm with [facility] eliquis has been dc and switched to xarelto  [phone]  Jerene Pitch

## 2015-05-31 ENCOUNTER — Telehealth: Payer: Self-pay | Admitting: *Deleted

## 2015-05-31 MED ORDER — APIXABAN 5 MG PO TABS: 5.0000 mg | ORAL_TABLET | Freq: Two times a day (BID) | ORAL | Status: DC

## 2015-05-31 NOTE — Telephone Encounter (Signed)
Okay to switch back?

## 2015-05-31 NOTE — Telephone Encounter (Signed)
S/w patient's spouse. Per Dr. Fletcher Anon, may switch back to Eliquis. Pt spouse verbalized understanding  States he would like prescription sent to [facility] in [place].

## 2015-05-31 NOTE — Telephone Encounter (Signed)
S/w patient's spouse who indicated he wants patient back on eliquis for reasons as stated in prior phone note. He is upset that pharmaceutical company would get $400 for 30 day supply of Xarelto. Per Dr. Tyrell Antonio 05/27/15  notes, patient was changed from Eliquis to [place] due to the fact she states she forgets to take Eliquis twice a day. Pt spouse states he now has a plan in place to ensure she takes Eliquis as scheduled and would like her to be switched back to it vs. Xarelto. Will forward to Dr. Fletcher Anon

## 2015-05-31 NOTE — Telephone Encounter (Signed)
Pt husband calling stating he would like pt to be put back  on eliquis not xarelto  Please send this into [facility]  He states he is upset that it is costing over 400 dollars, he understands that once the prior auth goes through that the price will lower on his end.  But he is upset with pharmaceutical companies that they will still be getting that amount for just 30 days.  He said he will not be giving them anything if the retail is 400 for just 30 days.  This is why he wants her to be on Eliquis  Please advise.

## 2015-06-02 ENCOUNTER — Other Ambulatory Visit: Payer: Self-pay | Admitting: Family Medicine

## 2015-06-10 NOTE — Telephone Encounter (Signed)
This encounter was created in error - please disregard.

## 2015-07-04 ENCOUNTER — Other Ambulatory Visit: Payer: Self-pay | Admitting: Family Medicine

## 2015-07-04 NOTE — Telephone Encounter (Signed)
Pt's husband called would like Dr. Sanda Klein to prescribe Aricept or Donepecil to his wife. Please call pt's husband with any issues. Pharm is [facility]. Pt's husband says the reason for this is he does not want to have to call that doctor in [place].Marland KitchenMarland KitchenThanks.

## 2015-07-04 NOTE — Telephone Encounter (Signed)
Routing to provider  

## 2015-07-04 NOTE — Telephone Encounter (Signed)
Appointment please Have them bring in any medicine bottles currently taking Also, make sure we have office notes from the neurologist recently

## 2015-07-08 ENCOUNTER — Telehealth: Payer: Self-pay | Admitting: Family Medicine

## 2015-07-08 ENCOUNTER — Other Ambulatory Visit: Payer: Self-pay | Admitting: *Deleted

## 2015-07-08 MED ORDER — CARVEDILOL 6.25 MG PO TABS: 6.2500 mg | ORAL_TABLET | Freq: Two times a day (BID) | ORAL | Status: DC

## 2015-07-08 NOTE — Telephone Encounter (Signed)
This is the second time calling pt and left 2 vm to call us back and schedule appt.

## 2015-07-10 ENCOUNTER — Telehealth: Payer: Self-pay

## 2015-07-10 NOTE — Telephone Encounter (Signed)
I spoke with patient's husband, he has left a message for [facility] Neurologist to see if they will refill her med. But we went ahead and scheduled an appt. With Dr. Sanda Klein.

## 2015-07-10 NOTE — Telephone Encounter (Signed)
Patient's husband left a message stating that [facility] will rx her Aricept. They would still like to keep the appointment scheduled with [place].

## 2015-08-02 ENCOUNTER — Ambulatory Visit: Payer: Self-pay | Admitting: Family Medicine

## 2015-09-02 ENCOUNTER — Encounter: Payer: Self-pay | Admitting: Family Medicine

## 2015-09-02 ENCOUNTER — Ambulatory Visit (INDEPENDENT_AMBULATORY_CARE_PROVIDER_SITE_OTHER): Payer: Medicare Other | Admitting: Family Medicine

## 2015-09-02 VITALS — BP 109/72 | HR 106 | Temp 97.8°F | Ht 60.3 in | Wt 150.0 lb

## 2015-09-02 DIAGNOSIS — Z8719 Personal history of other diseases of the digestive system: Secondary | ICD-10-CM

## 2015-09-02 DIAGNOSIS — M8580 Other specified disorders of bone density and structure, unspecified site: Secondary | ICD-10-CM

## 2015-09-02 DIAGNOSIS — R413 Other amnesia: Secondary | ICD-10-CM | POA: Diagnosis not present

## 2015-09-02 DIAGNOSIS — M1A9XX0 Chronic gout, unspecified, without tophus (tophi): Secondary | ICD-10-CM

## 2015-09-02 DIAGNOSIS — I480 Paroxysmal atrial fibrillation: Secondary | ICD-10-CM | POA: Diagnosis not present

## 2015-09-02 DIAGNOSIS — I10 Essential (primary) hypertension: Secondary | ICD-10-CM

## 2015-09-02 MED ORDER — DONEPEZIL HCL 10 MG PO TABS: 10.0000 mg | ORAL_TABLET | Freq: Every day | ORAL | Status: DC

## 2015-09-02 MED ORDER — ALLOPURINOL 100 MG PO TABS: 100.0000 mg | ORAL_TABLET | Freq: Every day | ORAL | Status: DC

## 2015-09-02 NOTE — Assessment & Plan Note (Signed)
Increase Aricept from 5 mg to 10 mg daily; I recommended that they NOT use the claritin, but he does not want to use nasal spray for her

## 2015-09-02 NOTE — Assessment & Plan Note (Signed)
Stop Uloric after current supply runs out; then start allopurinol; check uric acid and BMP 3 weeks later

## 2015-09-02 NOTE — Assessment & Plan Note (Signed)
On Eliquis, managed by cardiologist

## 2015-09-02 NOTE — Patient Instructions (Addendum)
Use up the Uloric that you have then stop it and start brand new allopurinol (don't take both together) Recheck uric acid level and kidney function about 3 weeks after you make that switch Start taking the stomach acid medicine every OTHER day Use the allergy medicine only when needed Increase the Aricept from 5 mg daily to 10 mg daily Come fasting for your labs when time comes Return in 2 months, but call sooner if needed Flu shots are recommended every fall She does not need any more pneumonia vaccines ever (she has received both the PCV-13 and PPSV-23)Gout Gout is an inflammatory arthritis caused by a buildup of uric acid crystals in the joints. Uric acid is a chemical that is normally present in the blood. When the level of uric acid in the blood is too high it can form crystals that deposit in your joints and tissues. This causes joint redness, soreness, and swelling (inflammation). Repeat attacks are common. Over time, uric acid crystals can form into masses (tophi) near a joint, destroying bone and causing disfigurement. Gout is treatable and often preventable. CAUSES  The disease begins with elevated levels of uric acid in the blood. Uric acid is produced by your body when it breaks down a naturally found substance called purines. Certain foods you eat, such as meats and fish, contain high amounts of purines. Causes of an elevated uric acid level include:  Being passed down from parent to child (heredity).  Diseases that cause increased uric acid production (such as obesity, psoriasis, and certain cancers).  Excessive alcohol use.  Diet, especially diets rich in meat and seafood.  Medicines, including certain cancer-fighting medicines (chemotherapy), water pills (diuretics), and aspirin.  Chronic kidney disease. The kidneys are no longer able to remove uric acid well.  Problems with metabolism. Conditions strongly associated with gout include:  Obesity.  High blood  pressure.  High cholesterol.  Diabetes. Not everyone with elevated uric acid levels gets gout. It is not understood why some people get gout and others do not. Surgery, joint injury, and eating too much of certain foods are some of the factors that can lead to gout attacks. SYMPTOMS   An attack of gout comes on quickly. It causes intense pain with redness, swelling, and warmth in a joint.  Fever can occur.  Often, only one joint is involved. Certain joints are more commonly involved:  Base of the big toe.  Knee.  Ankle.  Wrist.  Finger. Without treatment, an attack usually goes away in a few days to weeks. Between attacks, you usually will not have symptoms, which is different from many other forms of arthritis. DIAGNOSIS  Your caregiver will suspect gout based on your symptoms and exam. In some cases, tests may be recommended. The tests may include:  Blood tests.  Urine tests.  X-rays.  Joint fluid exam. This exam requires a needle to remove fluid from the joint (arthrocentesis). Using a microscope, gout is confirmed when uric acid crystals are seen in the joint fluid. TREATMENT  There are two phases to gout treatment: treating the sudden onset (acute) attack and preventing attacks (prophylaxis).  Treatment of an Acute Attack.  Medicines are used. These include anti-inflammatory medicines or steroid medicines.  An injection of steroid medicine into the affected joint is sometimes necessary.  The painful joint is rested. Movement can worsen the arthritis.  You may use warm or cold treatments on painful joints, depending which works best for you.  Treatment to Prevent Attacks.  If you suffer from frequent gout attacks, your caregiver may advise preventive medicine. These medicines are started after the acute attack subsides. These medicines either help your kidneys eliminate uric acid from your body or decrease your uric acid production. You may need to stay on these  medicines for a very long time.  The early phase of treatment with preventive medicine can be associated with an increase in acute gout attacks. For this reason, during the first few months of treatment, your caregiver may also advise you to take medicines usually used for acute gout treatment. Be sure you understand your caregiver's directions. Your caregiver may make several adjustments to your medicine dose before these medicines are effective.  Discuss dietary treatment with your caregiver or dietitian. Alcohol and drinks high in sugar and fructose and foods such as meat, poultry, and seafood can increase uric acid levels. Your caregiver or dietitian can advise you on drinks and foods that should be limited. HOME CARE INSTRUCTIONS   Do not take aspirin to relieve pain. This raises uric acid levels.  Only take over-the-counter or prescription medicines for pain, discomfort, or fever as directed by your caregiver.  Rest the joint as much as possible. When in bed, keep sheets and blankets off painful areas.  Keep the affected joint raised (elevated).  Apply warm or cold treatments to painful joints. Use of warm or cold treatments depends on which works best for you.  Use crutches if the painful joint is in your leg.  Drink enough fluids to keep your urine clear or pale yellow. This helps your body get rid of uric acid. Limit alcohol, sugary drinks, and fructose drinks.  Follow your dietary instructions. Pay careful attention to the amount of protein you eat. Your daily diet should emphasize fruits, vegetables, whole grains, and fat-free or low-fat milk products. Discuss the use of coffee, vitamin C, and cherries with your caregiver or dietitian. These may be helpful in lowering uric acid levels.  Maintain a healthy body weight. SEEK MEDICAL CARE IF:   You develop diarrhea, vomiting, or any side effects from medicines.  You do not feel better in 24 hours, or you are getting worse. SEEK  IMMEDIATE MEDICAL CARE IF:   Your joint becomes suddenly more tender, and you have chills or a fever. MAKE SURE YOU:   Understand these instructions.  Will watch your condition.  Will get help right away if you are not doing well or get worse. Document Released: 12/04/2000 Document Revised: 04/23/2014 Document Reviewed: 07/20/2012 [facility] Patient Information 2015 [place], [place]. This information is not intended to replace advice given to you by your health care provider. Make sure you discuss any questions you have with your health care provider.

## 2015-09-02 NOTE — Progress Notes (Signed)
BP 109/72 mmHg  Pulse 106  Temp(Src) 97.8 F (36.6 C)  Ht 5' 0.3" (1.532 m)  Wt 150 lb (68.04 kg)  BMI 28.99 kg/m2  SpO2 99%   Subjective:    Patient ID: Jennifer Klein, female    DOB: [birth year], 74 y.o.   MRN: [id number]  HPI: Jennifer Klein is a 74 y.o. female  Chief Complaint  Patient presents with  . 6 month follow up   She is here for follow-up of multiple things; high blood pressure; not checking at home; does not feel weak or dizzy; not eating much salt; she gets swelling if she stops the HCTZ; started by Dr. Fletcher Anon  GERD; wonders if she still needs it; no acid reflux or heartburn; no blood in stools; no abd pain  She has diarrhea; bothers her, just occasionally; from IBS  Husband would like to do something other than Uloric; no gout flares  She is enjoying getting outdoors with her roses; it has been hot outside; not long outdoors; has a schnauzer in a crate at home right now  She has allergies; would rather not use a nasal spray  Depression screen [facility] 2/9 09/02/2015  Decreased Interest 3  Down, Depressed, Hopeless 1  PHQ - 2 Score 4  Altered sleeping 1  Tired, decreased energy 1  Change in appetite 0  Feeling bad or failure about yourself  0  Trouble concentrating 3  Moving slowly or fidgety/restless 0  Suicidal thoughts 0  PHQ-9 Score 9   Relevant past medical, surgical, family and social history reviewed and updated as indicated. Interim medical history since our last visit reviewed. Allergies and medications reviewed and updated.  Review of Systems  Per HPI unless specifically indicated above     Objective:    BP 109/72 mmHg  Pulse 106  Temp(Src) 97.8 F (36.6 C)  Ht 5' 0.3" (1.532 m)  Wt 150 lb (68.04 kg)  BMI 28.99 kg/m2  SpO2 99%  Wt Readings from Last 3 Encounters:  09/02/15 150 lb (68.04 kg)  05/27/15 147 lb 12 oz (67.019 kg)  11/27/14 140 lb 8 oz (63.73 kg)    Physical Exam  Constitutional: She appears well-developed and  well-nourished.  HENT:  Nose: No rhinorrhea.  Mouth/Throat: Oropharynx is clear and moist.  Eyes: No scleral icterus.  Neck: No JVD present.  Cardiovascular: Normal rate and regular rhythm.   Pulmonary/Chest: Effort normal and breath sounds normal.  Abdominal: She exhibits no distension.  Neurological: She is alert. She displays no tremor.  Registration: apple, penny, chair Year: "what year is it?"... "I'm not used to this" Month: "Well, see my birthday is in [birth year]" Day of the week: "I don't know, I don't worry about that stuff" Recall: "three words? I don't know"  Skin: Skin is warm and dry. No pallor.  Psychiatric: She has a normal mood and affect. Her speech is normal. Cognition and memory are impaired. She exhibits abnormal recent memory.      Assessment & Plan:   Problem List Items Addressed This Visit      Cardiovascular and Mediastinum   Hypertension    Excellent control today; continue current regimen      Paroxysmal atrial fibrillation    On Eliquis, managed by cardiologist        Musculoskeletal and Integument   Osteopenia    Will stop the PPI since she is asymptomatic from a GERD standpoint and the PPI may interfere with Ca2+ absorption  Other   Gout    Stop Uloric after current supply runs out; then start allopurinol; check uric acid and BMP 3 weeks later      Memory loss - Primary    Increase Aricept from 5 mg to 10 mg daily; I recommended that they NOT use the claritin, but he does not want to use nasal spray for her       Other Visit Diagnoses    Hx of gastroesophageal reflux (GERD)        asymptomatic; may stop the PPI, but cautioned to watch for any s/s and call right away if needed       Follow up plan: Return in about 2 months (around 11/02/2015) for memory (will do full MMSE).  Medications Discontinued During This Encounter  Medication Reason  . lansoprazole (PREVACID) 30 MG capsule Duplicate  . ULORIC 80 MG TABS Discontinued by  provider  . donepezil (ARICEPT) 5 MG tablet Dose change   Meds ordered this encounter  Medications  . lansoprazole (PREVACID) 30 MG capsule    Sig: Take 30 mg by mouth every other day.  . peppermint oil liquid    Sig: by Does not apply route as needed (liqui-gel).  . Probiotic Product (PROBIOTIC ADVANCED PO)    Sig: Take by mouth.  Marland Kitchen allopurinol (ZYLOPRIM) 100 MG tablet    Sig: Take 1 tablet (100 mg total) by mouth daily. STOP Uloric    Dispense:  30 tablet    Refill:  2  . donepezil (ARICEPT) 10 MG tablet    Sig: Take 1 tablet (10 mg total) by mouth at bedtime.    Dispense:  30 tablet    Refill:  2    Pharmacist -- higher dose    An after-visit summary was printed and given to the patient at [facility].  Please see the patient instructions which may contain other information and recommendations beyond what is mentioned above in the assessment and plan.  Face-to-face time with patient was more than 25 minutes, >50% time spent counseling and coordination of care

## 2015-09-06 NOTE — Assessment & Plan Note (Signed)
Will stop the PPI since she is asymptomatic from a GERD standpoint and the PPI may interfere with Ca2+ absorption

## 2015-09-06 NOTE — Assessment & Plan Note (Signed)
Excellent control today; continue current regimen

## 2015-09-16 DIAGNOSIS — Z23 Encounter for immunization: Secondary | ICD-10-CM | POA: Diagnosis not present

## 2015-10-08 ENCOUNTER — Telehealth: Payer: Self-pay | Admitting: Family Medicine

## 2015-10-08 DIAGNOSIS — M1A9XX0 Chronic gout, unspecified, without tophus (tophi): Secondary | ICD-10-CM

## 2015-10-08 NOTE — Telephone Encounter (Signed)
Pt's husband called stated he would like a call from Dr. Sanda Klein ASAP regarding a medication and lab orders. Thanks.

## 2015-10-09 NOTE — Telephone Encounter (Signed)
Patient's husband needed her to be scheduled for a lab appt. Also asked about the PPI. Reminded him of Dr. Delight Ovens note at last appt, he said he wasn't sure about stopping it completely and wondered if she could take it maybe a few days a week.

## 2015-10-10 ENCOUNTER — Other Ambulatory Visit: Payer: Medicare Other

## 2015-10-10 DIAGNOSIS — M1A9XX0 Chronic gout, unspecified, without tophus (tophi): Secondary | ICD-10-CM

## 2015-10-11 LAB — URIC ACID: Uric Acid: 6.8 mg/dL (ref 2.5–7.1)

## 2015-10-11 LAB — BASIC METABOLIC PANEL
BUN/Creatinine Ratio: 23 (ref 11–26)
BUN: 18 mg/dL (ref 8–27)
CALCIUM: 8.8 mg/dL (ref 8.7–10.3)
CHLORIDE: 99 mmol/L (ref 97–106)
CO2: 28 mmol/L (ref 18–29)
CREATININE: 0.77 mg/dL (ref 0.57–1.00)
GFR calc Af Amer: 87 mL/min/{1.73_m2} (ref >59)
GFR, EST NON AFRICAN AMERICAN: 76 mL/min/{1.73_m2} (ref >59)
Glucose: 73 mg/dL (ref 65–99)
Potassium: 3.8 mmol/L (ref 3.5–5.2)
Sodium: 141 mmol/L (ref 136–144)

## 2015-10-14 ENCOUNTER — Encounter: Payer: Self-pay | Admitting: Family Medicine

## 2015-10-15 ENCOUNTER — Other Ambulatory Visit: Payer: Self-pay | Admitting: Family Medicine

## 2015-10-15 MED ORDER — ALLOPURINOL 100 MG PO TABS: 100.0000 mg | ORAL_TABLET | Freq: Every day | ORAL | Status: DC

## 2015-10-15 NOTE — Telephone Encounter (Signed)
Labs reviewed Rx sent

## 2015-10-15 NOTE — Telephone Encounter (Signed)
Routing to provider  

## 2015-10-15 NOTE — Telephone Encounter (Signed)
Pt needs refill for allopurinol sent to [facility]

## 2015-11-04 ENCOUNTER — Encounter: Payer: Self-pay | Admitting: Family Medicine

## 2015-11-04 ENCOUNTER — Ambulatory Visit (INDEPENDENT_AMBULATORY_CARE_PROVIDER_SITE_OTHER): Payer: Medicare Other | Admitting: Family Medicine

## 2015-11-04 VITALS — BP 104/68 | HR 61 | Temp 97.6°F | Wt 154.0 lb

## 2015-11-04 DIAGNOSIS — I480 Paroxysmal atrial fibrillation: Secondary | ICD-10-CM

## 2015-11-04 DIAGNOSIS — R413 Other amnesia: Secondary | ICD-10-CM

## 2015-11-04 DIAGNOSIS — I10 Essential (primary) hypertension: Secondary | ICD-10-CM

## 2015-11-04 DIAGNOSIS — M1A00X0 Idiopathic chronic gout, unspecified site, without tophus (tophi): Secondary | ICD-10-CM | POA: Diagnosis not present

## 2015-11-04 DIAGNOSIS — R635 Abnormal weight gain: Secondary | ICD-10-CM

## 2015-11-04 DIAGNOSIS — K2270 Barrett's esophagus without dysplasia: Secondary | ICD-10-CM

## 2015-11-04 NOTE — Progress Notes (Signed)
BP 104/68 mmHg  Pulse 61  Temp(Src) 97.6 F (36.4 C)  Wt 154 lb (69.854 kg)  SpO2 99%   Subjective:    Patient ID: Jennifer Klein, female    DOB: 10/12/1941, 74 y.o.   MRN: [id number]  HPI: Jennifer Klein is a 74 y.o. female  Chief Complaint  Patient presents with  . Hypertension  . Memory Loss   Patient is here with her husband Last visit, the uloric was stopped and the allopurinol was started No gout flares since the change in medicines She does not like gravy or [place], hardly eats those things she says; even with Thanksgiving coming up, should not be an issue She gets chicken occasionally; no organ meats  Blood pressure today reviewed; not checking BP at home; not feeling dizzy or light-headed; she does not think it's too low  Patient has dementia; trouble remembering things, recent, current events; husband declined full MMSE today (we had planned to do that)  She is having trouble taking the large calcium pills; the chewables are a small dose  She has gained a little weight; husband says she likes her ice cream  Relevant past medical, surgical, family and social history reviewed and updated as indicated She has Barretts, but husband isn't sure when she's supposed to go back to GI; last EGD was a few years ago; no abd pain  Interim medical history since our last visit reviewed. Allergies and medications reviewed and updated.  Review of Systems Per HPI unless specifically indicated above     Objective:    BP 104/68 mmHg  Pulse 61  Temp(Src) 97.6 F (36.4 C)  Wt 154 lb (69.854 kg)  SpO2 99%  Wt Readings from Last 3 Encounters:  11/04/15 154 lb (69.854 kg)  09/02/15 150 lb (68.04 kg)  05/27/15 147 lb 12 oz (67.019 kg)    Physical Exam  Constitutional: She appears well-developed and well-nourished.  HENT:  Nose: No rhinorrhea.  Mouth/Throat: Oropharynx is clear and moist.  Eyes: No scleral icterus.  Neck: No JVD present.  Cardiovascular: Normal rate and  regular rhythm.   Pulmonary/Chest: Effort normal and breath sounds normal.  Abdominal: She exhibits no distension.  Neurological: She is alert. She displays no tremor.  Skin: Skin is warm and dry. No pallor.  Psychiatric: She has a normal mood and affect. Her speech is normal. Cognition and memory are impaired. She exhibits abnormal recent memory.  Very pleasant and cooperative   Results for orders placed or performed in visit on 0000000  Basic Metabolic Panel (BMET)  Result Value Ref Range   Glucose 73 65 - 99 mg/dL   BUN 18 8 - 27 mg/dL   Creatinine, Ser 0.77 0.57 - 1.00 mg/dL   GFR calc non Af Amer 76 >59 mL/min/1.73   GFR calc Af Amer 87 >59 mL/min/1.73   BUN/Creatinine Ratio 23 11 - 26   Sodium 141 136 - 144 mmol/L   Potassium 3.8 3.5 - 5.2 mmol/L   Chloride 99 97 - 106 mmol/L   CO2 28 18 - 29 mmol/L   Calcium 8.8 8.7 - 10.3 mg/dL  Uric acid  Result Value Ref Range   Uric Acid 6.8 2.5 - 7.1 mg/dL      Assessment & Plan:   Problem List Items Addressed This Visit      Cardiovascular and Mediastinum   Hypertension - Primary    Well-controlled; no symptoms of orthostatic hypotension; continue meds      Paroxysmal atrial  fibrillation ([place])    On Xa inhibitor        Digestive   Barrett's esophagus    Encouraged husband to check with Dr. Dorothey Baseman office to see if/when he wants to see her back, when next surveillance EGD might be due; she is asymptomatic        Other   Gout    Avoid foods rich in purine; recheck uric acid and creatinine after the change from uloric to allopurinol showed good response with her kidney function, but UA not to goal; patient did not want to do any labs today; does not really want to do a lot of bloodwork; I opted therefore to leave her dose instead of increase further and make her come back for the next creatinine and UA level; she'll limit purine-rich foods, especially with Thanksgiving coming up      Memory loss    Husband declined MMSE  today; she is adequately fed, groomed, ambulatory, etc.; no danger to self; continue Aricept; will follow-up in six months, sooner if desired      Weight gain    Weight up 7 pounds in 5 months; watch diet; if symptoms persist, we can recheck TSH; last TSH was normal         Follow up plan: Return in about 6 months (around 05/03/2016) for regular follow-up.   An after-visit summary was printed and given to the patient at [facility].  Please see the patient instructions which may contain other information and recommendations beyond what is mentioned above in the assessment and plan.

## 2015-11-04 NOTE — Patient Instructions (Addendum)
Limit [place] and gravy as you are doing Just call for a lab appointment if weight creep up Return in 6 months, but sooner with any issues Check with Dr. Allen Norris to see when he wants to see you back

## 2015-11-06 DIAGNOSIS — H353130 Nonexudative age-related macular degeneration, bilateral, stage unspecified: Secondary | ICD-10-CM | POA: Diagnosis not present

## 2015-11-09 DIAGNOSIS — K2270 Barrett's esophagus without dysplasia: Secondary | ICD-10-CM | POA: Insufficient documentation

## 2015-11-09 DIAGNOSIS — R635 Abnormal weight gain: Secondary | ICD-10-CM | POA: Insufficient documentation

## 2015-11-09 NOTE — Assessment & Plan Note (Addendum)
Husband declined MMSE today; she is adequately fed, groomed, ambulatory, etc.; no danger to self; continue Aricept; will follow-up in six months, sooner if desired

## 2015-11-09 NOTE — Assessment & Plan Note (Signed)
On Xa inhibitor

## 2015-11-09 NOTE — Assessment & Plan Note (Signed)
Encouraged husband to check with Dr. Dorothey Baseman office to see if/when he wants to see her back, when next surveillance EGD might be due; she is asymptomatic

## 2015-11-09 NOTE — Assessment & Plan Note (Signed)
Weight up 7 pounds in 5 months; watch diet; if symptoms persist, we can recheck TSH; last TSH was normal

## 2015-11-09 NOTE — Assessment & Plan Note (Signed)
Well-controlled; no symptoms of orthostatic hypotension; continue meds

## 2015-11-09 NOTE — Assessment & Plan Note (Signed)
Avoid foods rich in purine; recheck uric acid and creatinine after the change from uloric to allopurinol showed good response with her kidney function, but UA not to goal; patient did not want to do any labs today; does not really want to do a lot of bloodwork; I opted therefore to leave her dose instead of increase further and make her come back for the next creatinine and UA level; she'll limit purine-rich foods, especially with Thanksgiving coming up

## 2015-11-11 DIAGNOSIS — L718 Other rosacea: Secondary | ICD-10-CM | POA: Diagnosis not present

## 2015-11-19 ENCOUNTER — Other Ambulatory Visit: Payer: Self-pay | Admitting: Family Medicine

## 2015-11-19 NOTE — Telephone Encounter (Signed)
LAST VISIT: 11/04/2015  Request for allopurinol 100mg  and donepezil 10mg .

## 2015-11-25 ENCOUNTER — Encounter: Payer: Self-pay | Admitting: Cardiovascular Disease

## 2015-11-25 ENCOUNTER — Ambulatory Visit (INDEPENDENT_AMBULATORY_CARE_PROVIDER_SITE_OTHER): Payer: Medicare Other | Admitting: Cardiovascular Disease

## 2015-11-25 VITALS — BP 126/83 | HR 98 | Ht 62.0 in | Wt 152.8 lb

## 2015-11-25 DIAGNOSIS — I480 Paroxysmal atrial fibrillation: Secondary | ICD-10-CM

## 2015-11-25 DIAGNOSIS — I10 Essential (primary) hypertension: Secondary | ICD-10-CM

## 2015-11-25 MED ORDER — RIVAROXABAN 20 MG PO TABS: 20.0000 mg | ORAL_TABLET | Freq: Every day | ORAL | Status: DC

## 2015-11-25 NOTE — Progress Notes (Signed)
Primary care physician: Dr. Jeananne Rama  HPI  This is a pleasant 74 year old female who was is here today for a followup visit regarding paroxysmal atrial fibrillation.  She has known history of hypertension and dementia.  Previous 48 hour Holter monitor in 2014 showed normal sinus rhythm with paroxysmal atrial fibrillation/flutter which happened at least 2.26% of the time with associated tachycardia. The patient was asymptomatic.  Echocardiogram in September of 2014 showed low normal LV systolic function with an ejection fraction of 50-55%, mild mitral regurgitation and mildly dilated left atrium.  She has been doing very well with no chest pain, shortness of breath or palpitations. She continues to have memory problems. She denies any bleeding complications. She is noted to be in atrial fibrillation today but she is asymptomatic.     Allergies  Allergen Reactions  . Darvon [Propoxyphene Hcl]     As a child     Current Outpatient Prescriptions on File Prior to Visit  Medication Sig Dispense Refill  . allopurinol (ZYLOPRIM) 100 MG tablet TAKE 1 TABLET BY MOUTH ONCE DAILY. STOP ULORIC. 30 tablet 3  . apixaban (ELIQUIS) 5 MG TABS tablet Take 1 tablet (5 mg total) by mouth 2 (two) times daily. 60 tablet 5  . CALCIUM PO Take by mouth daily.    . carvedilol (COREG) 6.25 MG tablet Take 1 tablet (6.25 mg total) by mouth 2 (two) times daily. 60 tablet 3  . donepezil (ARICEPT) 10 MG tablet TAKE 1 TABLET BY MOUTH AT BEDTIME. 30 tablet 6  . hydrochlorothiazide (MICROZIDE) 12.5 MG capsule Take 1 capsule (12.5 mg total) by mouth daily. 30 capsule 3  . lansoprazole (PREVACID) 30 MG capsule Take 30 mg by mouth every other day.    . Loperamide HCl (IMODIUM A-D PO) Take by mouth as needed.    . loratadine (CLARITIN) 10 MG tablet Take 10 mg by mouth daily.    . peppermint oil liquid by Does not apply route daily.     . Probiotic Product (PROBIOTIC ADVANCED PO) Take by mouth.     No current  facility-administered medications on file prior to visit.     Past Medical History  Diagnosis Date  . Hypertension   . Gout   . Memory loss   . Senile osteoporosis   . Hemorrhoids      Past Surgical History  Procedure Laterality Date  . Tubal ligation       Family History  Problem Relation Age of Onset  . Heart disease Father   . Gout Brother      Social History   Social History  . Marital Status: Married    Spouse Name: N/A  . Number of Children: N/A  . Years of Education: N/A   Occupational History  . Not on file.   Social History Main Topics  . Smoking status: Former Smoker -- 0.25 packs/day for 1 years    Types: Cigarettes  . Smokeless tobacco: Never Used  . Alcohol Use: 0.0 oz/week    0 Standard drinks or equivalent per week     Comment: occassionally  . Drug Use: No  . Sexual Activity: Not on file   Other Topics Concern  . Not on file   Social History Narrative   Lives with husband in [place].   Worked at [employer] and [employer].      Diet - regular             PHYSICAL EXAM   BP 126/83 mmHg  Pulse 98  Ht 5\' 2"  (1.575 m)  Wt 152 lb 12 oz (69.287 kg)  BMI 27.93 kg/m2 Constitutional: She is oriented to person, place, and time. She appears well-developed and well-nourished. No distress.  HENT: No nasal discharge.  Head: Normocephalic and atraumatic.  Eyes: Pupils are equal and round. Right eye exhibits no discharge. Left eye exhibits no discharge.  Neck: Normal range of motion. Neck supple. No JVD present. No thyromegaly present.  Cardiovascular: Normal rate, irregular rhythm, normal heart sounds. Exam reveals no gallop and no friction rub. No murmur heard.  Pulmonary/Chest: Effort normal and breath sounds normal. No stridor. No respiratory distress. She has no wheezes. She has no rales. She exhibits no tenderness.  Abdominal: Soft. Bowel sounds are normal. She exhibits no distension. There is no tenderness. There is no rebound and no  guarding.  Musculoskeletal: Normal range of motion. She exhibits no edema and no tenderness.  Neurological: She is alert and oriented to person, place, and time. Coordination normal.  Skin: Skin is warm and dry. No rash noted. She is not diaphoretic. No erythema. No pallor.  Psychiatric: She has a normal mood and affect. Her behavior is normal. Judgment and thought content normal.     EKG:  Coarse Atrial fibrillation  ABNORMAL       ASSESSMENT AND PLAN

## 2015-11-25 NOTE — Patient Instructions (Signed)
Medication Instructions:  Your physician has recommended you make the following change in your medication:  STOP taking eliquis START taking xarelto 20mg  once daily   Labwork: none  Testing/Procedures: none  Follow-Up: Your physician wants you to follow-up in: six months with Dr. Fletcher Anon.  You will receive a reminder letter in the mail two months in advance. If you don't receive a letter, please call our office to schedule the follow-up appointment.   Any Other Special Instructions Will Be Listed Below (If Applicable).     If you need a refill on your cardiac medications before your next appointment, please call your pharmacy.

## 2015-11-26 NOTE — Assessment & Plan Note (Signed)
BP is controlled on Carvedilol and HCTZ.

## 2015-11-26 NOTE — Assessment & Plan Note (Signed)
She is noted to be in A-fib today but she is asymptomatic and ventricular rate is controlled. Continue Carvedilol.  Her insurance will no longer cover Eliquis. Thus, I switched her to Xarelto 20 mg daily.

## 2015-12-03 ENCOUNTER — Other Ambulatory Visit: Payer: Self-pay

## 2015-12-03 MED ORDER — CARVEDILOL 6.25 MG PO TABS
6.2500 mg | ORAL_TABLET | Freq: Two times a day (BID) | ORAL | Status: DC
Start: 2015-12-03 — End: 2016-04-07

## 2015-12-03 NOTE — Telephone Encounter (Signed)
Refill sent for carvedilol 6.25 mg  

## 2015-12-23 ENCOUNTER — Other Ambulatory Visit: Payer: Self-pay | Admitting: Family Medicine

## 2015-12-23 NOTE — Telephone Encounter (Signed)
Oct 10, 2015 BMP reviewed; Rx sent

## 2016-03-30 ENCOUNTER — Other Ambulatory Visit: Payer: Self-pay

## 2016-03-30 MED ORDER — LANSOPRAZOLE 30 MG PO CPDR: 30.0000 mg | DELAYED_RELEASE_CAPSULE | ORAL | Status: DC

## 2016-03-30 NOTE — Telephone Encounter (Signed)
rx sent

## 2016-03-30 NOTE — Telephone Encounter (Signed)
Routing to provider. She is following you to [facility]. 

## 2016-03-31 ENCOUNTER — Other Ambulatory Visit: Payer: Self-pay

## 2016-03-31 NOTE — Telephone Encounter (Signed)
I just approved this yesterday; please resolve with pharmacy; thanks

## 2016-04-07 ENCOUNTER — Other Ambulatory Visit: Payer: Self-pay | Admitting: *Deleted

## 2016-04-07 MED ORDER — CARVEDILOL 6.25 MG PO TABS: 6.2500 mg | ORAL_TABLET | Freq: Two times a day (BID) | ORAL | Status: DC

## 2016-04-13 ENCOUNTER — Other Ambulatory Visit: Payer: Self-pay | Admitting: Family Medicine

## 2016-04-14 NOTE — Telephone Encounter (Signed)
Last uric acid and cr reviewed; upcoming appt May; Rx approved

## 2016-04-28 ENCOUNTER — Other Ambulatory Visit: Payer: Self-pay | Admitting: Family Medicine

## 2016-04-28 NOTE — Telephone Encounter (Signed)
Pt has appt next week; rx refilled

## 2016-05-04 ENCOUNTER — Encounter: Payer: Self-pay | Admitting: Family Medicine

## 2016-05-04 ENCOUNTER — Ambulatory Visit (INDEPENDENT_AMBULATORY_CARE_PROVIDER_SITE_OTHER): Payer: Medicare Other | Admitting: Family Medicine

## 2016-05-04 VITALS — BP 118/72 | HR 70 | Temp 98.2°F | Resp 16 | Wt 160.2 lb

## 2016-05-04 DIAGNOSIS — R635 Abnormal weight gain: Secondary | ICD-10-CM | POA: Diagnosis not present

## 2016-05-04 DIAGNOSIS — G309 Alzheimer's disease, unspecified: Secondary | ICD-10-CM

## 2016-05-04 DIAGNOSIS — Z111 Encounter for screening for respiratory tuberculosis: Secondary | ICD-10-CM | POA: Diagnosis not present

## 2016-05-04 DIAGNOSIS — I480 Paroxysmal atrial fibrillation: Secondary | ICD-10-CM | POA: Diagnosis not present

## 2016-05-04 DIAGNOSIS — R32 Unspecified urinary incontinence: Secondary | ICD-10-CM | POA: Diagnosis not present

## 2016-05-04 DIAGNOSIS — Z5181 Encounter for therapeutic drug level monitoring: Secondary | ICD-10-CM

## 2016-05-04 DIAGNOSIS — F02818 Dementia in other diseases classified elsewhere, unspecified severity, with other behavioral disturbance: Secondary | ICD-10-CM

## 2016-05-04 DIAGNOSIS — M1A00X0 Idiopathic chronic gout, unspecified site, without tophus (tophi): Secondary | ICD-10-CM | POA: Diagnosis not present

## 2016-05-04 DIAGNOSIS — F0281 Dementia in other diseases classified elsewhere with behavioral disturbance: Secondary | ICD-10-CM

## 2016-05-04 DIAGNOSIS — F0280 Dementia in other diseases classified elsewhere without behavioral disturbance: Secondary | ICD-10-CM

## 2016-05-04 HISTORY — DX: Alzheimer's disease, unspecified: G30.9

## 2016-05-04 HISTORY — DX: Dementia in other diseases classified elsewhere, unspecified severity, without behavioral disturbance, psychotic disturbance, mood disturbance, and anxiety: F02.80

## 2016-05-04 HISTORY — DX: Dementia in other diseases classified elsewhere with behavioral disturbance: F02.81

## 2016-05-04 HISTORY — DX: Dementia in other diseases classified elsewhere, unspecified severity, with other behavioral disturbance: F02.818

## 2016-05-04 MED ORDER — MEMANTINE HCL 28 X 5 MG & 21 X 10 MG PO TABS: ORAL_TABLET | ORAL | Status: DC

## 2016-05-04 NOTE — Patient Instructions (Addendum)
Let's add the Namenda, and we'll increase dose by one pill each week until she is up to 10 mg twice a day Continue the other memory medicine as you are doing Let me know how I can help

## 2016-05-04 NOTE — Assessment & Plan Note (Signed)
Rate controlled today, regular rhythm; on anticoagulation

## 2016-05-04 NOTE — Progress Notes (Signed)
BP 118/72 mmHg  Pulse 70  Temp(Src) 98.2 F (36.8 C) (Oral)  Resp 16  Wt 160 lb 3.2 oz (72.666 kg)  SpO2 96%   Subjective:    Patient ID: Jennifer Klein, female    DOB: 09-03-1941, 75 y.o.   MRN: [id number]  HPI: Jennifer Klein is a 75 y.o. female  Chief Complaint  Patient presents with  . 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 husband says GERD is much better The allopurinol was a great change says husband; no flares of gout; was on Uloric and it was $$$ On the blood thinner; no nosebleeds, no blood in urine or stool Urine leakage has stopped Weight gain, husband is a good cook  Depression screen [facility] 2/9 05/04/2016 09/02/2015  Decreased Interest 0 3  Down, Depressed, Hopeless 0 1  PHQ - 2 Score 0 4  Altered sleeping - 1  Tired, decreased energy - 1  Change in appetite - 0  Feeling bad or failure about yourself  - 0  Trouble concentrating - 3  Moving slowly or fidgety/restless - 0  Suicidal thoughts - 0  PHQ-9 Score - 9   Relevant past medical, surgical, family and social history reviewed Past Medical History  Diagnosis Date  . Hypertension   . Gout   . Memory loss   . Senile osteoporosis   . Hemorrhoids   . Alzheimer's dementia without behavioral disturbance 05/04/2016  . Hyperlipidemia LDL goal <100 05/05/2016  . B12 deficiency 05/06/2016   Past Surgical History  Procedure Laterality Date  . Tubal ligation     Family History  Problem Relation Age of Onset  . Heart disease Father   . Gout Brother    Social History  Substance Use Topics  . Smoking status:  Former Smoker -- 0.25 packs/day for 1 years    Types: Cigarettes  . Smokeless tobacco: Never Used  . Alcohol Use: 0.0 oz/week    0 Standard drinks or equivalent per week     Comment: occassionally   Interim medical history since last visit reviewed. Allergies and medications reviewed  Review of Systems Per HPI unless specifically indicated above     Objective:    BP 118/72 mmHg  Pulse 70  Temp(Src) 98.2 F (36.8 C) (Oral)  Resp 16  Wt 160 lb 3.2 oz (72.666 kg)  SpO2 96%  Wt Readings from Last 3 Encounters:  05/04/16 160 lb 3.2 oz (72.666 kg)  11/25/15 152 lb 12 oz (69.287 kg)  11/04/15 154 lb (69.854 kg)    Physical Exam  Constitutional: She appears well-developed and well-nourished.  Weight gain almost 8 pounds over last 5+ months  HENT:  Mouth/Throat: Mucous membranes are normal.  Eyes: No scleral icterus.  Cardiovascular: Normal rate and regular rhythm.   Pulmonary/Chest: Effort normal and breath sounds normal.  Abdominal: She exhibits no distension.  Neurological: She is alert. She displays no tremor.  Limited historian  Psychiatric: Her mood appears not anxious. Her speech is not rapid and/or pressured. She is not  agitated and not aggressive. Cognition and memory are impaired. She does not express impulsivity. She does not exhibit a depressed mood. She exhibits abnormal recent memory. She is attentive.      Assessment & Plan:   Problem List Items Addressed This Visit      Cardiovascular and Mediastinum   Paroxysmal atrial fibrillation (HCC)    Rate controlled today, regular rhythm; on anticoagulation      Relevant Orders   Lipid Panel w/o Chol/HDL Ratio (Completed)     Nervous and Auditory   Alzheimer's dementia without behavioral disturbance - Primary    Continue aricept; add namenda; husband may call to provide update; he is seeking assistance for her care      Relevant Medications   memantine (NAMENDA TITRATION PAK) tablet pack   Other Relevant  Orders   Vitamin B12 (Completed)   VITAMIN D 25 Hydroxy (Vit-D Deficiency, Fractures) (Completed)     Other   Gout    Check uric acid, Cr      Relevant Orders   Uric acid (Completed)   Medication monitoring encounter   Relevant Orders   CBC with Differential/Platelet (Completed)   Comprehensive metabolic panel (Completed)   Weight gain    Check thyroid function      Relevant Orders   TSH (Completed)    Other Visit Diagnoses    Screening for tuberculosis        Relevant Orders    Quantiferon tb gold assay (blood)    Urinary incontinence, unspecified incontinence type        history of    Relevant Orders    UA/M w/rflx Culture, Routine       Follow up plan: No Follow-up on file.  An after-visit summary was printed and given to the patient at [facility].  Please see the patient instructions which may contain other information and recommendations beyond what is mentioned above in the assessment and plan.  Meds ordered this encounter  Medications  . Misc Natural Products (COLON CARE PO)    Sig: Take 2 capsules by mouth daily.  . memantine (NAMENDA TITRATION PAK) tablet pack    Sig: One by mouth daily x 1 week, then one BID x 1 week, then two every AM, and one every PM x 1 week, then two BID    Dispense:  74 tablet    Refill:  0    If my math is not correct, give one month supply please (titration up)    Orders Placed This Encounter  Procedures  . Microscopic Examination  . CBC with Differential/Platelet  . TSH  . Vitamin B12  . VITAMIN D 25 Hydroxy (Vit-D Deficiency, Fractures)  . Lipid Panel w/o Chol/HDL Ratio  . Uric acid  . Comprehensive metabolic panel  . Quantiferon tb gold assay (blood)  . UA/M w/rflx Culture, Routine  . UA/M w/rflx Culture, Routine  . Quantiferon tb gold assay (blood)  . QuantiFERON In Tube

## 2016-05-04 NOTE — Assessment & Plan Note (Signed)
Check thyroid function 

## 2016-05-04 NOTE — Assessment & Plan Note (Signed)
Check uric acid, Cr

## 2016-05-05 ENCOUNTER — Other Ambulatory Visit: Payer: Self-pay | Admitting: Family Medicine

## 2016-05-05 ENCOUNTER — Encounter: Payer: Self-pay | Admitting: Family Medicine

## 2016-05-05 DIAGNOSIS — E785 Hyperlipidemia, unspecified: Secondary | ICD-10-CM | POA: Insufficient documentation

## 2016-05-05 HISTORY — DX: Hyperlipidemia, unspecified: E78.5

## 2016-05-05 MED ORDER — ALLOPURINOL 100 MG PO TABS: 200.0000 mg | ORAL_TABLET | Freq: Every day | ORAL | Status: DC

## 2016-05-06 ENCOUNTER — Other Ambulatory Visit: Payer: Self-pay | Admitting: Family Medicine

## 2016-05-06 ENCOUNTER — Telehealth: Payer: Self-pay | Admitting: Family Medicine

## 2016-05-06 ENCOUNTER — Encounter: Payer: Self-pay | Admitting: Family Medicine

## 2016-05-06 ENCOUNTER — Other Ambulatory Visit: Payer: Self-pay

## 2016-05-06 DIAGNOSIS — Z5181 Encounter for therapeutic drug level monitoring: Secondary | ICD-10-CM

## 2016-05-06 DIAGNOSIS — E785 Hyperlipidemia, unspecified: Secondary | ICD-10-CM

## 2016-05-06 DIAGNOSIS — E538 Deficiency of other specified B group vitamins: Secondary | ICD-10-CM

## 2016-05-06 HISTORY — DX: Deficiency of other specified B group vitamins: E53.8

## 2016-05-06 LAB — MICROSCOPIC EXAMINATION: Casts: NONE SEEN /lpf

## 2016-05-06 LAB — COMPREHENSIVE METABOLIC PANEL
A/G RATIO: 1.6 (ref 1.2–2.2)
ALT: 10 IU/L (ref 0–32)
AST: 14 IU/L (ref 0–40)
Albumin: 3.9 g/dL (ref 3.5–4.8)
Alkaline Phosphatase: 106 IU/L (ref 39–117)
BUN/Creatinine Ratio: 20 (ref 12–28)
BUN: 17 mg/dL (ref 8–27)
Bilirubin Total: 0.5 mg/dL (ref 0.0–1.2)
CALCIUM: 8.5 mg/dL — AB (ref 8.7–10.3)
CO2: 26 mmol/L (ref 18–29)
CREATININE: 0.84 mg/dL (ref 0.57–1.00)
Chloride: 100 mmol/L (ref 96–106)
GFR calc non Af Amer: 68 mL/min/{1.73_m2} (ref >59)
GFR, EST AFRICAN AMERICAN: 78 mL/min/{1.73_m2} (ref >59)
GLOBULIN, TOTAL: 2.5 g/dL (ref 1.5–4.5)
Glucose: 93 mg/dL (ref 65–99)
POTASSIUM: 3.8 mmol/L (ref 3.5–5.2)
Sodium: 144 mmol/L (ref 134–144)
Total Protein: 6.4 g/dL (ref 6.0–8.5)

## 2016-05-06 LAB — VITAMIN D 25 HYDROXY (VIT D DEFICIENCY, FRACTURES): Vit D, 25-Hydroxy: 60.1 ng/mL (ref 30.0–100.0)

## 2016-05-06 LAB — CBC WITH DIFFERENTIAL/PLATELET
BASOS ABS: 0 10*3/uL (ref 0.0–0.2)
Basos: 0 %
EOS (ABSOLUTE): 0.2 10*3/uL (ref 0.0–0.4)
Eos: 2 %
HEMOGLOBIN: 12.7 g/dL (ref 11.1–15.9)
Hematocrit: 38.9 % (ref 34.0–46.6)
IMMATURE GRANS (ABS): 0 10*3/uL (ref 0.0–0.1)
Immature Granulocytes: 0 %
LYMPHS: 24 %
Lymphocytes Absolute: 2 10*3/uL (ref 0.7–3.1)
MCH: 31 pg (ref 26.6–33.0)
MCHC: 32.6 g/dL (ref 31.5–35.7)
MCV: 95 fL (ref 79–97)
MONOCYTES: 8 %
Monocytes Absolute: 0.7 10*3/uL (ref 0.1–0.9)
Neutrophils Absolute: 5.7 10*3/uL (ref 1.4–7.0)
Neutrophils: 66 %
Platelets: 290 10*3/uL (ref 150–379)
RBC: 4.1 x10E6/uL (ref 3.77–5.28)
RDW: 13.5 % (ref 12.3–15.4)
WBC: 8.6 10*3/uL (ref 3.4–10.8)

## 2016-05-06 LAB — LIPID PANEL W/O CHOL/HDL RATIO
CHOLESTEROL TOTAL: 237 mg/dL — AB (ref 100–199)
HDL: 46 mg/dL (ref >39)
LDL Calculated: 152 mg/dL — ABNORMAL HIGH (ref 0–99)
TRIGLYCERIDES: 197 mg/dL — AB (ref 0–149)
VLDL Cholesterol Cal: 39 mg/dL (ref 5–40)

## 2016-05-06 LAB — QUANTIFERON IN TUBE
QFT TB AG MINUS NIL VALUE: 0.02 IU/mL
QUANTIFERON MITOGEN VALUE: 2.6 [IU]/mL
QUANTIFERON TB AG VALUE: 0.04 IU/mL
QUANTIFERON TB GOLD: NEGATIVE
Quantiferon Nil Value: 0.02 IU/mL

## 2016-05-06 LAB — UA/M W/RFLX CULTURE, ROUTINE
BILIRUBIN UA: NEGATIVE
GLUCOSE, UA: NEGATIVE
KETONES UA: NEGATIVE
LEUKOCYTES UA: NEGATIVE
NITRITE UA: NEGATIVE
PROTEIN UA: NEGATIVE
RBC UA: NEGATIVE
UUROB: 0.2 mg/dL (ref 0.2–1.0)
pH, UA: 5.5 (ref 5.0–7.5)

## 2016-05-06 LAB — URIC ACID: URIC ACID: 7.4 mg/dL — AB (ref 2.5–7.1)

## 2016-05-06 LAB — VITAMIN B12: VITAMIN B 12: 253 pg/mL (ref 211–946)

## 2016-05-06 LAB — QUANTIFERON TB GOLD ASSAY (BLOOD)

## 2016-05-06 LAB — TSH: TSH: 2.53 u[IU]/mL (ref 0.450–4.500)

## 2016-05-06 MED ORDER — ATORVASTATIN CALCIUM 20 MG PO TABS
20.0000 mg | ORAL_TABLET | Freq: Every day | ORAL | Status: DC
Start: 2016-05-06 — End: 2016-06-30

## 2016-05-06 MED ORDER — CYANOCOBALAMIN 1000 MCG/ML IJ SOLN: 1000.0000 ug | INTRAMUSCULAR | Status: AC

## 2016-05-06 NOTE — Telephone Encounter (Signed)
Please let husband know that the quantiferon gold test (for TB) was negative Thank you

## 2016-05-07 NOTE — Telephone Encounter (Signed)
Husband notified  

## 2016-05-11 DIAGNOSIS — L719 Rosacea, unspecified: Secondary | ICD-10-CM | POA: Diagnosis not present

## 2016-05-11 DIAGNOSIS — L812 Freckles: Secondary | ICD-10-CM | POA: Diagnosis not present

## 2016-05-11 DIAGNOSIS — L578 Other skin changes due to chronic exposure to nonionizing radiation: Secondary | ICD-10-CM | POA: Diagnosis not present

## 2016-05-11 DIAGNOSIS — L821 Other seborrheic keratosis: Secondary | ICD-10-CM | POA: Diagnosis not present

## 2016-05-11 DIAGNOSIS — D1800 Hemangioma unspecified site: Secondary | ICD-10-CM | POA: Diagnosis not present

## 2016-05-20 ENCOUNTER — Ambulatory Visit (INDEPENDENT_AMBULATORY_CARE_PROVIDER_SITE_OTHER): Payer: Medicare Other

## 2016-05-20 DIAGNOSIS — E538 Deficiency of other specified B group vitamins: Secondary | ICD-10-CM | POA: Diagnosis not present

## 2016-05-20 MED ORDER — CYANOCOBALAMIN 1000 MCG/ML IJ SOLN
1000.0000 ug | Freq: Once | INTRAMUSCULAR | Status: AC
  Administered 2016-05-20: 1000 ug via INTRAMUSCULAR

## 2016-05-25 ENCOUNTER — Other Ambulatory Visit: Payer: Self-pay | Admitting: Family Medicine

## 2016-05-25 NOTE — Telephone Encounter (Signed)
Please call patient's husband Let him know that I'd like to try stopping her HCTZ; her last BP was so very good The HCTZ can increase uric acid, so I think this will be a win-win situation, stopping it and may bring her uric acid down even further Keep an eye on BP (either check at home or at local pharmacy once a week for the next 2-3 weeks, and let us know if top number over 140) Thank you

## 2016-05-25 NOTE — Telephone Encounter (Signed)
Pt husband notified 

## 2016-05-27 ENCOUNTER — Ambulatory Visit (INDEPENDENT_AMBULATORY_CARE_PROVIDER_SITE_OTHER): Payer: Medicare Other

## 2016-05-27 DIAGNOSIS — E538 Deficiency of other specified B group vitamins: Secondary | ICD-10-CM | POA: Diagnosis not present

## 2016-05-27 MED ORDER — CYANOCOBALAMIN 1000 MCG/ML IJ SOLN
1000.0000 ug | Freq: Once | INTRAMUSCULAR | Status: AC
  Administered 2016-05-27: 1000 ug via INTRAMUSCULAR

## 2016-06-01 NOTE — Assessment & Plan Note (Signed)
Continue aricept; add namenda; husband may call to provide update; he is seeking assistance for her care

## 2016-06-02 ENCOUNTER — Other Ambulatory Visit: Payer: Self-pay | Admitting: Family Medicine

## 2016-06-02 NOTE — Telephone Encounter (Signed)
Left voice mail

## 2016-06-02 NOTE — Telephone Encounter (Signed)
Please see 05/25/16 note; we wanted patient's husband to stop giving her this medicine and monitor BP; thanks

## 2016-06-03 ENCOUNTER — Ambulatory Visit (INDEPENDENT_AMBULATORY_CARE_PROVIDER_SITE_OTHER): Payer: Medicare Other

## 2016-06-03 ENCOUNTER — Telehealth: Payer: Self-pay | Admitting: Family Medicine

## 2016-06-03 DIAGNOSIS — E538 Deficiency of other specified B group vitamins: Secondary | ICD-10-CM

## 2016-06-03 MED ORDER — CYANOCOBALAMIN 1000 MCG/ML IJ SOLN
1000.0000 ug | Freq: Once | INTRAMUSCULAR | Status: AC
  Administered 2016-06-03: 1000 ug via INTRAMUSCULAR

## 2016-06-03 MED ORDER — CYANOCOBALAMIN 1000 MCG/ML IJ SOLN: 1000.0000 ug | Freq: Once | INTRAMUSCULAR | Status: DC

## 2016-06-03 MED ORDER — MEMANTINE HCL 10 MG PO TABS: 10.0000 mg | ORAL_TABLET | Freq: Two times a day (BID) | ORAL | Status: DC

## 2016-06-03 NOTE — Telephone Encounter (Signed)
I tried to call him earlier; needed refills of med once starter pack done; no other needs

## 2016-06-03 NOTE — Telephone Encounter (Signed)
Husband is requesting return call. He has a few questions that he would like to ask you. There are a few that he did not wish mention to me over the phone.

## 2016-06-04 ENCOUNTER — Telehealth: Payer: Self-pay | Admitting: Cardiovascular Disease

## 2016-06-04 NOTE — Telephone Encounter (Signed)
Spoke w/ pt's husband.  He reports that pt's PCP declined to refill pt's HCTZ. Pt does not weigh daily or check BP daily. Advised him that pt is overdue for 6 mo f/u. Pt sched to see Dr. Fletcher Anon tomorrow @ 11:15.

## 2016-06-04 NOTE — Telephone Encounter (Signed)
° °   Dr. Sanda Klein dc hctz and now is having swelling    Pt c/o swelling: STAT is pt has developed SOB within 24 hours  1. How long have you been experiencing swelling?   Since dc hctz about a week ago   2. Where is the swelling located?  BLE primary Right leg   3.  Are you currently taking a "fluid pill"?  No   4.  Are you currently SOB?  No   5.  Have you traveled recently?  No sits with feet down frequently but they are up now    Bp 104/62  The other day

## 2016-06-05 ENCOUNTER — Encounter: Payer: Self-pay | Admitting: Cardiovascular Disease

## 2016-06-05 ENCOUNTER — Ambulatory Visit (INDEPENDENT_AMBULATORY_CARE_PROVIDER_SITE_OTHER): Payer: Medicare Other | Admitting: Physician Assistant

## 2016-06-05 VITALS — BP 120/68 | HR 60 | Ht 62.0 in | Wt 164.5 lb

## 2016-06-05 DIAGNOSIS — I480 Paroxysmal atrial fibrillation: Secondary | ICD-10-CM

## 2016-06-05 DIAGNOSIS — M7989 Other specified soft tissue disorders: Secondary | ICD-10-CM | POA: Diagnosis not present

## 2016-06-05 DIAGNOSIS — E669 Obesity, unspecified: Secondary | ICD-10-CM

## 2016-06-05 DIAGNOSIS — I10 Essential (primary) hypertension: Secondary | ICD-10-CM | POA: Diagnosis not present

## 2016-06-05 DIAGNOSIS — R55 Syncope and collapse: Secondary | ICD-10-CM | POA: Diagnosis not present

## 2016-06-05 DIAGNOSIS — F0390 Unspecified dementia without behavioral disturbance: Secondary | ICD-10-CM

## 2016-06-05 DIAGNOSIS — E785 Hyperlipidemia, unspecified: Secondary | ICD-10-CM

## 2016-06-05 MED ORDER — HYDROCHLOROTHIAZIDE 12.5 MG PO CAPS: 12.5000 mg | ORAL_CAPSULE | Freq: Every day | ORAL | Status: DC

## 2016-06-05 MED ORDER — CARVEDILOL 3.125 MG PO TABS: 3.1250 mg | ORAL_TABLET | Freq: Two times a day (BID) | ORAL | Status: DC

## 2016-06-05 NOTE — Patient Instructions (Signed)
Medication Instructions:  Your physician has recommended you make the following change in your medication:  START Hctz 12.5mg  once daily DECREASE coreg to 3.125mg  twice daily   Labwork: none  Testing/Procedures: none  Follow-Up: Your physician recommends that you schedule a follow-up appointment in: one month with Dr. Fletcher Anon.    Any Other Special Instructions Will Be Listed Below (If Applicable). Increase fluids Check blood pressure and heart rate 3 times a day over the weekend. Please call Monday with readings.      If you need a refill on your cardiac medications before your next appointment, please call your pharmacy.

## 2016-06-05 NOTE — Progress Notes (Signed)
Cardiology Office Note Date:  06/05/2016  Patient ID:  Jennifer, Klein Jul 27, 1941, MRN [id number] PCP:  Enid Derry, MD  Cardiologist:  Dr. Fletcher Anon, MD    Chief Complaint: 6 month follow up, notes presyncope  History of Present Illness: Jennifer Klein is a 75 y.o. female with history of PAF on Xarelto, HTN, dementia, and HLD who presents for routine 6 month follow up and notes a presyncopal episode approximately 4-5 days prior.   Previous 48-hour Holter monitor in 2014 showed NSR with PAF/flutter which happened at least 2.26% of the time with associated tachycardia. She was asymptomatic at that time. Echo in 08/2013 showed low normal LVSF with an EF of 50-55%, mild MR, and mildly dilated LA. At her last follow up on 11/25/2015 she was doing well.   Today, she notes having had a presyncopal episode approximately 4-5 days ago while standing in her kitchen making a sandwich. Symptoms were gradual in onset and patient's husband helped her to the floor. She never lost consciousness and did not hit her head. No preceding chest pain, palpitations, SOB, diaphoresis, nausea, or vomiting. Was asymptomatic post event, just mad she was on the floor. After this event the patient's daughter felt like the patient could use more fluids and they started pushing them at that time. She has not had any further episodes.   Secondly, she notes mild increae in LE edema over the past 1 week since being off HCTZ. This was discontinued by PCP given soft blood pressures. She has dealt with increased LE swelling in the past when she has been off HCTZ. With the resumption of HCTZ at that time her LE swelling resolved. Her swelling is currently improving. She is tolerating her medications without issues.     Past Medical History  Diagnosis Date  . Hypertension   . Gout   . Memory loss   . Senile osteoporosis   . Hemorrhoids   . Alzheimer's dementia without behavioral disturbance 05/04/2016  . Hyperlipidemia LDL goal  <100 05/05/2016  . B12 deficiency 05/06/2016  . PAF (paroxysmal atrial fibrillation) ([place])     a. on Xarelto; b. 48-hr Holter 2014: NSR with PAF/flutter which happened at least 2.26% of the time w/ associated tachycardia. Patient was asymptomatic; c. CHADS2VASc => 4 (HTN, age x 2, female)  . Systolic dysfunction     a. echo 08/2013: EF 50-55%, mild MR, mildly dilated LA    Past Surgical History  Procedure Laterality Date  . Tubal ligation      Current Outpatient Prescriptions  Medication Sig Dispense Refill  . allopurinol (ZYLOPRIM) 100 MG tablet Take 2 tablets (200 mg total) by mouth daily. 60 tablet 0  . atorvastatin (LIPITOR) 20 MG tablet Take 1 tablet (20 mg total) by mouth at bedtime. 30 tablet 1  . CALCIUM PO Take by mouth daily.    . carvedilol (COREG) 3.125 MG tablet Take 1 tablet (3.125 mg total) by mouth 2 (two) times daily. 60 tablet 3  . donepezil (ARICEPT) 10 MG tablet TAKE 1 TABLET BY MOUTH AT BEDTIME. 30 tablet 6  . lansoprazole (PREVACID) 30 MG capsule TAKE 1 CAPSULE BY MOUTH EVERY OTHER DAY 15 capsule 1  . Loperamide HCl (IMODIUM A-D PO) Take by mouth as needed.    . memantine (NAMENDA) 10 MG tablet Take 1 tablet (10 mg total) by mouth 2 (two) times daily. 60 tablet 5  . Misc Natural Products (COLON CARE PO) Take 2 capsules by mouth daily.    Marland Kitchen  peppermint oil liquid by Does not apply route daily.     . Probiotic Product (PROBIOTIC ADVANCED PO) Take by mouth.    . rivaroxaban (XARELTO) 20 MG TABS tablet Take 1 tablet (20 mg total) by mouth daily with supper. 30 tablet 11  . hydrochlorothiazide (MICROZIDE) 12.5 MG capsule Take 1 capsule (12.5 mg total) by mouth daily. 30 capsule 3   No current facility-administered medications for this visit.    Allergies:   Darvon   Social History:  The patient  reports that she has quit smoking. Her smoking use included Cigarettes. She has a .25 pack-year smoking history. She has never used smokeless tobacco. She reports that she  drinks alcohol. She reports that she does not use illicit drugs.   Family History:  The patient's family history includes Gout in her brother; Heart disease in her father.  ROS:   Review of Systems  Constitutional: Positive for malaise/fatigue. Negative for fever, chills, weight loss and diaphoresis.  HENT: Negative for congestion.   Eyes: Negative for discharge and redness.  Respiratory: Negative for cough, hemoptysis, sputum production, shortness of breath and wheezing.   Cardiovascular: Positive for leg swelling. Negative for chest pain, palpitations, orthopnea, claudication and PND.       Improved LE swelling at time of OV  Gastrointestinal: Negative for nausea, vomiting and abdominal pain.  Musculoskeletal: Negative for myalgias, back pain, joint pain, falls and neck pain.  Skin: Negative for rash.  Neurological: Negative for dizziness, tingling, tremors, sensory change, speech change, focal weakness, seizures, loss of consciousness, weakness and headaches.       Positive for presyncope  Endo/Heme/Allergies: Does not bruise/bleed easily.  Psychiatric/Behavioral: Negative for substance abuse. The patient is not nervous/anxious.   All other systems reviewed and are negative.    PHYSICAL EXAM:  VS:  BP 120/68 mmHg  Pulse 60  Ht 5\' 2"  (1.575 m)  Wt 164 lb 8 oz (74.617 kg)  BMI 30.08 kg/m2 BMI: Body mass index is 30.08 kg/(m^2). Well nourished, well developed, in no acute distress HEENT: normocephalic, atraumatic Neck: no JVD, carotid bruits or masses Cardiac: normal S1, S2; RRR; no murmurs, rubs, or gallops Lungs: clear to auscultation bilaterally, no wheezing, rhonchi or rales Abd: soft, nontender, no hepatomegaly, + BS MS: no deformity or atrophy Ext: trace pre-tibial edema with RLE >LLE Skin: warm and dry, no rash Neuro:  moves all extremities spontaneously, no focal abnormalities noted, follows commands Psych: euthymic mood, full affect with underlying dementia    EKG:   Was ordered and interpreted by me today. Shows NSR with sinus arrhythmia, 60 bpm, RBBB, no acute st/t changes  Recent Labs: 05/04/2016: ALT 10; BUN 17; Creatinine, Ser 0.84; Platelets 290; Potassium 3.8; Sodium 144; TSH 2.530  05/04/2016: Cholesterol, Total 237*; HDL 46; LDL Calculated 152*; Triglycerides 197*    Wt Readings from Last 3 Encounters:  06/05/16 164 lb 8 oz (74.617 kg)  05/04/16 160 lb 3.2 oz (72.666 kg)  11/25/15 152 lb 12 oz (69.287 kg)   Orthostatic Vital Signs: Lying: 110/72, 58 bpm Sitting: 92/68, 63 bpm Standing: 100/62, 65 bpm Standing x 3 min: 90/62, 81 bpm  Other studies reviewed: Additional studies/records reviewed today include: summarized above  ASSESSMENT AND PLAN:  1. Presyncope: Found to nearly be orthostatic at today's visit. Her symptoms were gradual in onset and prior to the presyncopal episode she was hardly drinking fluids making her more susceptible to dehydration and orthostasis. She has since increased to PO fluid intake significantly,  and still remains nearly orthostatic. No further events since increasing her fluids. She will continue to increase her PO fluid consumption. Offered patient and her husband repeat outpatient cardiac monitoring to evaluate for possible post-termination pause given her history of PAF. They declined this at this time. Should another episode occur they will move forward with the outpatient cardiac monitoring.   2. LE swelling: She has previously had LE swelling in the setting of not being on HCTZ. At that time when she was resumed on HCTZ her LE swelling resolved. The patient and husband report her LE swelling is improving at this time, off HCTZ, though still has some. Restart HCTZ 12.5 mg daily along with decreasing Coreg to 3.125 mg bid to allow for adequate blood pressure room. When sitting she should elevate her legs. Consider the addition of compression hose. No symptoms concerning for DVT at this time.   3. PAF: Currently  in sinus rhythm with heart rate in the low 60's to mid 50's bpm. Decrease Coreg to 3.125 mg bid to allow for adequate BP room for the resumption of HCTZ 12.5 mg daily as above for her LE swelling. Continue Xarelto 20 mg q dinner. CrCl 66.91 mL/min from labs drawn on 05/04/16. CHADS2VASc at least 4 (HTN, age x 2, sex category), giving her an estimated annual stroke risk of 4.0%.  4. HTN: Well controlled to slightly soft. Coreg decreased as above to allow for the addition of HCTZ 12.5 mg daily. Her BP will be checked over the weekend and they will call on Monday, 6/19 with readings. Further titration to be done at that time if needed.   5. Dementia: On Aricept and Namenda per PCP.   6. HLD: Lipitor 20 mg daily.   7. Obesity: Healthy diet and weight loss advised. Weight is up-trending, though she has been eating a poor diet with no physical activity.   Disposition: F/u with Dr. Fletcher Anon, MD in 1 month  Current medicines are reviewed at length with the patient today.  The patient did not have any concerns regarding medicines.  Melvern Banker PA-C 06/05/2016 12:33 PM     [address] [place], [place] [address] [phone]

## 2016-06-08 ENCOUNTER — Telehealth: Payer: Self-pay | Admitting: Cardiovascular Disease

## 2016-06-08 ENCOUNTER — Telehealth: Payer: Self-pay | Admitting: Family Medicine

## 2016-06-08 NOTE — Telephone Encounter (Signed)
Pt spouse calling stating he is calling in her BP readings  06/05/16 pm 157/78 hr 60 06/06/16 am 126/72 hr 60 06/07/16 am 133/84 hr 65 pm 140/82 hr 56 Sunday night 128/80 hr 104   Please advise

## 2016-06-08 NOTE — Telephone Encounter (Signed)
Maggie from [facility] requesting return call. States patient was prescribed B-12 but husband was confused. He told them that he normally just bring her in and have it done in office. Please call Maggie for clarification [phone]

## 2016-06-08 NOTE — Telephone Encounter (Signed)
I talked with Jennifer Klein about that order; the staff did not mean to send a prescription to the pharmacy Please call pharmacy; tell them to disregard that Rx; we'll give it here

## 2016-06-08 NOTE — Telephone Encounter (Signed)
Pharmacy and patient notified.

## 2016-06-08 NOTE — Telephone Encounter (Signed)
Left message on machine for patient to contact the office.   

## 2016-06-08 NOTE — Telephone Encounter (Signed)
She can continue HCTZ 12.5 mg daily for now as well as Coreg 3.125 mg bid. Her heart rate precludes further titration of Coreg. If her blood pressures remain in the 120 to low [id number] systolic range she can continue HCTZ at 12.5 mg daily. If she notes BP increasing to the 140 range and upward she can increase HCTZ to 25 mg daily.

## 2016-06-08 NOTE — Telephone Encounter (Signed)
Per Ryan's instructions at 6/16 OV, pt called w/BP and HR readings. Forward to PA to review and advise.

## 2016-06-08 NOTE — Telephone Encounter (Signed)
Phone call returned by spouse, [name] (on [place]). Reviewed Ryan's recommendations w/spouse who verbalized understanding and is agreeable w/plan. He had no further questions at this time.

## 2016-06-10 ENCOUNTER — Ambulatory Visit (INDEPENDENT_AMBULATORY_CARE_PROVIDER_SITE_OTHER): Payer: Medicare Other

## 2016-06-10 DIAGNOSIS — E538 Deficiency of other specified B group vitamins: Secondary | ICD-10-CM | POA: Diagnosis not present

## 2016-06-10 MED ORDER — CYANOCOBALAMIN 1000 MCG/ML IJ SOLN
1000.0000 ug | Freq: Once | INTRAMUSCULAR | Status: AC
  Administered 2016-06-10: 1000 ug via INTRAMUSCULAR

## 2016-06-17 ENCOUNTER — Telehealth: Payer: Self-pay | Admitting: Family Medicine

## 2016-06-17 NOTE — Telephone Encounter (Signed)
I spoke with patient's husband He just couldn't believe the charges They wouldn't pay for the lipid panel or the vit D

## 2016-06-17 NOTE — Telephone Encounter (Signed)
Patients husband called with concerns about wifes labs that were done. They have the results, but are needing explaination as to why some of the labs she got done were ordered. Please return call.

## 2016-06-17 NOTE — Telephone Encounter (Signed)
I tried to call from my cell phone, but phone call was not allowed

## 2016-06-30 ENCOUNTER — Other Ambulatory Visit: Payer: Self-pay | Admitting: Family Medicine

## 2016-06-30 DIAGNOSIS — Z5181 Encounter for therapeutic drug level monitoring: Secondary | ICD-10-CM

## 2016-06-30 DIAGNOSIS — M1A00X0 Idiopathic chronic gout, unspecified site, without tophus (tophi): Secondary | ICD-10-CM

## 2016-06-30 DIAGNOSIS — E785 Hyperlipidemia, unspecified: Secondary | ICD-10-CM

## 2016-06-30 NOTE — Assessment & Plan Note (Signed)
Check lipids 

## 2016-06-30 NOTE — Assessment & Plan Note (Signed)
Check sgpt and BMP on med changes

## 2016-06-30 NOTE — Assessment & Plan Note (Signed)
Recheck uric acid and creatinine

## 2016-06-30 NOTE — Telephone Encounter (Signed)
Patient is due for labs please Please ask patient's husband to bring her in for recheck She was supposed to have had her gout labs rechecked in early June She's due now for liver and cholesterol Let's get all of them at once this week, please bring her fasting; okay to do afternoon lab (she can have breakfast and just skip lunch and come around 3 or 4 pm)

## 2016-07-01 NOTE — Telephone Encounter (Signed)
Left voice mail

## 2016-07-06 ENCOUNTER — Telehealth: Payer: Self-pay | Admitting: Family Medicine

## 2016-07-06 NOTE — Telephone Encounter (Signed)
Pts husband would like for pt to be put back on Prevacid daily for indigestion. Pt also got the call to get fasting labs and they will take care of that.

## 2016-07-07 ENCOUNTER — Telehealth: Payer: Self-pay | Admitting: Family Medicine

## 2016-07-07 ENCOUNTER — Ambulatory Visit (INDEPENDENT_AMBULATORY_CARE_PROVIDER_SITE_OTHER): Payer: Medicare Other

## 2016-07-07 ENCOUNTER — Ambulatory Visit (INDEPENDENT_AMBULATORY_CARE_PROVIDER_SITE_OTHER): Payer: Medicare Other | Admitting: Cardiovascular Disease

## 2016-07-07 ENCOUNTER — Other Ambulatory Visit: Payer: Self-pay | Admitting: Family Medicine

## 2016-07-07 ENCOUNTER — Encounter: Payer: Self-pay | Admitting: Cardiovascular Disease

## 2016-07-07 VITALS — BP 120/90 | HR 100 | Ht 62.0 in | Wt 159.1 lb

## 2016-07-07 DIAGNOSIS — E538 Deficiency of other specified B group vitamins: Secondary | ICD-10-CM

## 2016-07-07 DIAGNOSIS — I480 Paroxysmal atrial fibrillation: Secondary | ICD-10-CM | POA: Diagnosis not present

## 2016-07-07 MED ORDER — LANSOPRAZOLE 30 MG PO CPDR: 30.0000 mg | DELAYED_RELEASE_CAPSULE | Freq: Every day | ORAL | Status: DC

## 2016-07-07 MED ORDER — CYANOCOBALAMIN 1000 MCG/ML IJ SOLN
1000.0000 ug | Freq: Once | INTRAMUSCULAR | Status: AC
  Administered 2016-07-07: 1000 ug via INTRAMUSCULAR

## 2016-07-07 NOTE — Patient Instructions (Signed)
Medication Instructions: Continue same medications.   Labwork: None.   Procedures/Testing: None.   Follow-Up: 6 months with Dr. Arida.   Any Additional Special Instructions Will Be Listed Below (If Applicable).     If you need a refill on your cardiac medications before your next appointment, please call your pharmacy.   

## 2016-07-07 NOTE — Telephone Encounter (Signed)
The computer is not allowing me to refill, says future encounter (?); I will close this note and open a new one to see if that will work

## 2016-07-07 NOTE — Telephone Encounter (Signed)
Patient is overdue for labs She was just here; did she get them done by any chance? If not, please contact husband about them again; thanks

## 2016-07-07 NOTE — Progress Notes (Signed)
Cardiology Office Note   Date:  07/07/2016   ID:  Jennifer Klein, DOB 01-23-1941, MRN [id number]  PCP:  Enid Derry, MD  Cardiologist:   Kathlyn Sacramento, MD   Chief Complaint  Patient presents with  . Follow-up    1 month f/u, no sx      History of Present Illness: Jennifer Klein is a 75 y.o. female who presents for  a followup visit regarding paroxysmal atrial fibrillation.  She has known history of hypertension and dementia.  Echocardiogram in September of 2014 showed low normal LV systolic function with an ejection fraction of 50-55%, mild mitral regurgitation and mildly dilated left atrium. She was seen last month by Thurmond Butts for presyncope which was felt to be due to orthostatic hypotension. Symptoms resolved with increased fluid intake. She is doing very well and denies any chest pain, shortness of breath or palpitations. Continues to suffer from significant memory decline.  She has known history of Barrett's esophagus with controlled symptoms.   Past Medical History  Diagnosis Date  . Hypertension   . Gout   . Memory loss   . Senile osteoporosis   . Hemorrhoids   . Alzheimer's dementia without behavioral disturbance 05/04/2016  . Hyperlipidemia LDL goal <100 05/05/2016  . B12 deficiency 05/06/2016  . PAF (paroxysmal atrial fibrillation) ([place])     a. on Xarelto; b. 48-hr Holter 2014: NSR with PAF/flutter which happened at least 2.26% of the time w/ associated tachycardia. Patient was asymptomatic; c. CHADS2VASc => 4 (HTN, age x 2, female)  . Systolic dysfunction     a. echo 08/2013: EF 50-55%, mild MR, mildly dilated LA    Past Surgical History  Procedure Laterality Date  . Tubal ligation       Current Outpatient Prescriptions  Medication Sig Dispense Refill  . allopurinol (ZYLOPRIM) 100 MG tablet Take 2 tablets (200 mg total) by mouth daily. 60 tablet 0  . atorvastatin (LIPITOR) 20 MG tablet TAKE 1 TABLET BY MOUTH AT BEDTIME 30 tablet 0  . CALCIUM PO Take by mouth  daily.    . carvedilol (COREG) 3.125 MG tablet Take 1.5625 mg by mouth 2 (two) times daily with a meal.    . donepezil (ARICEPT) 10 MG tablet TAKE 1 TABLET BY MOUTH AT BEDTIME. 30 tablet 11  . hydrochlorothiazide (MICROZIDE) 12.5 MG capsule Take 1 capsule (12.5 mg total) by mouth daily. 30 capsule 3  . lansoprazole (PREVACID) 30 MG capsule Take 30 mg by mouth daily at 12 noon.    . Loperamide HCl (IMODIUM A-D PO) Take by mouth as needed.    . memantine (NAMENDA) 10 MG tablet Take 1 tablet (10 mg total) by mouth 2 (two) times daily. 60 tablet 5  . Misc Natural Products (COLON CARE PO) Take 2 capsules by mouth daily.    . peppermint oil liquid by Does not apply route daily.     . Probiotic Product (PROBIOTIC ADVANCED PO) Take by mouth.    . rivaroxaban (XARELTO) 20 MG TABS tablet Take 1 tablet (20 mg total) by mouth daily with supper. 30 tablet 11   No current facility-administered medications for this visit.    Allergies:   Darvon    Social History:  The patient  reports that she has quit smoking. Her smoking use included Cigarettes. She has a .25 pack-year smoking history. She has never used smokeless tobacco. She reports that she drinks alcohol. She reports that she does not use illicit drugs.  Family History:  The patient's family history includes Gout in her brother; Heart disease in her father.    ROS:  Please see the history of present illness.   Otherwise, review of systems are positive for none.   All other systems are reviewed and negative.    PHYSICAL EXAM: VS:  BP 120/90 mmHg  Pulse 100  Ht 5\' 2"  (1.575 m)  Wt 159 lb 1.9 oz (72.176 kg)  BMI 29.10 kg/m2  SpO2 96% , BMI Body mass index is 29.1 kg/(m^2). GEN: Well nourished, well developed, in no acute distress HEENT: normal Neck: no JVD, carotid bruits, or masses Cardiac: Irregularly irregular; no murmurs, rubs, or gallops,no edema  Respiratory:  clear to auscultation bilaterally, normal work of breathing GI: soft,  nontender, nondistended, + BS MS: no deformity or atrophy Skin: warm and dry, no rash Neuro:  Strength and sensation are intact Psych: euthymic mood, full affect   EKG:  EKG is ordered today. The ekg ordered today demonstrates atrial flutter with variable AV block. Right bundle branch block.   Recent Labs: 05/04/2016: ALT 10; BUN 17; Creatinine, Ser 0.84; Platelets 290; Potassium 3.8; Sodium 144; TSH 2.530    Lipid Panel    Component Value Date/Time   CHOL 237* 05/04/2016 1423   CHOL 220* 06/03/2012 1017   TRIG 197* 05/04/2016 1423   HDL 46 05/04/2016 1423   HDL 63.10 06/03/2012 1017   CHOLHDL 3 06/03/2012 1017   VLDL 45.6* 06/03/2012 1017   LDLCALC 152* 05/04/2016 1423   LDLDIRECT 137.4 06/03/2012 1017      Wt Readings from Last 3 Encounters:  07/07/16 159 lb 1.9 oz (72.176 kg)  06/05/16 164 lb 8 oz (74.617 kg)  05/04/16 160 lb 3.2 oz (72.666 kg)        ASSESSMENT AND PLAN:  1.  Paroxysmal atrial fibrillation: The patient might be transitioning towards chronic atrial fibrillation/flutter. She is asymptomatic and ventricular rate is reasonably controlled. She is tolerating anticoagulation with Xarelto. I made no changes.  2. Dizziness due to orthostatic hypotension: Symptoms resolved with increased fluid intake.  3. Essential hypertension: Blood pressure is controlled on current medications.   Disposition:   FU with me in 6 months  Signed,  Kathlyn Sacramento, MD  07/07/2016 4:02 PM    [facility]

## 2016-07-09 NOTE — Telephone Encounter (Signed)
Husband notified  

## 2016-08-04 ENCOUNTER — Other Ambulatory Visit: Payer: Self-pay | Admitting: Family Medicine

## 2016-08-04 NOTE — Telephone Encounter (Signed)
Please call husband; she was due for labs last month; Roselyn Reef called and spoke with him on July 20th; I still don't have lab results to know if the cholesterol medicine is the right dose or if it's okay with her liver; please ask him to have these labs drawn in the next week; I'll approve 7 days of medicine to give them time to get in to get these done, but we want to be safe and make sure the medicine is effective and not causing harm Thank you

## 2016-08-07 NOTE — Telephone Encounter (Signed)
Pt husband forgot will bring her in next week

## 2016-08-11 DIAGNOSIS — E785 Hyperlipidemia, unspecified: Secondary | ICD-10-CM | POA: Diagnosis not present

## 2016-08-11 DIAGNOSIS — Z5181 Encounter for therapeutic drug level monitoring: Secondary | ICD-10-CM | POA: Diagnosis not present

## 2016-08-12 LAB — URIC ACID: URIC ACID: 7.6 mg/dL — AB (ref 2.5–7.1)

## 2016-08-12 LAB — LIPID PANEL
CHOLESTEROL TOTAL: 133 mg/dL (ref 100–199)
Chol/HDL Ratio: 3.3 ratio units (ref 0.0–4.4)
HDL: 40 mg/dL (ref >39)
LDL CALC: 63 mg/dL (ref 0–99)
TRIGLYCERIDES: 151 mg/dL — AB (ref 0–149)
VLDL Cholesterol Cal: 30 mg/dL (ref 5–40)

## 2016-08-12 LAB — ALT: ALT: 11 IU/L (ref 0–32)

## 2016-08-13 ENCOUNTER — Other Ambulatory Visit: Payer: Self-pay | Admitting: Family Medicine

## 2016-08-13 MED ORDER — ALLOPURINOL 100 MG PO TABS: 200.0000 mg | ORAL_TABLET | Freq: Every day | ORAL | 1 refills | Status: DC

## 2016-08-15 LAB — BASIC METABOLIC PANEL WITH GFR
BUN/Creatinine Ratio: 18 (ref 12–28)
BUN: 19 mg/dL (ref 8–27)
CO2: 24 mmol/L (ref 18–29)
Calcium: 8.9 mg/dL (ref 8.7–10.3)
Chloride: 99 mmol/L (ref 96–106)
Creatinine, Ser: 1.08 mg/dL — ABNORMAL HIGH (ref 0.57–1.00)
GFR calc Af Amer: 58 mL/min/{1.73_m2} — ABNORMAL LOW
GFR calc non Af Amer: 50 mL/min/{1.73_m2} — ABNORMAL LOW
Glucose: 113 mg/dL — ABNORMAL HIGH (ref 65–99)
Potassium: 3.4 mmol/L — ABNORMAL LOW (ref 3.5–5.2)
Sodium: 143 mmol/L (ref 134–144)

## 2016-08-15 LAB — SPECIMEN STATUS REPORT

## 2016-08-25 ENCOUNTER — Other Ambulatory Visit: Payer: Self-pay | Admitting: Family Medicine

## 2016-08-29 DIAGNOSIS — R600 Localized edema: Secondary | ICD-10-CM | POA: Diagnosis not present

## 2016-08-31 ENCOUNTER — Telehealth: Payer: Self-pay | Admitting: Family Medicine

## 2016-09-01 NOTE — Telephone Encounter (Signed)
First available or colleague here or back to urgent care

## 2016-09-02 NOTE — Telephone Encounter (Signed)
Pt has an appt 09/04/2016

## 2016-09-04 ENCOUNTER — Encounter: Payer: Self-pay | Admitting: Family Medicine

## 2016-09-04 ENCOUNTER — Ambulatory Visit (INDEPENDENT_AMBULATORY_CARE_PROVIDER_SITE_OTHER): Payer: Medicare Other | Admitting: Family Medicine

## 2016-09-04 VITALS — BP 122/64 | HR 94 | Temp 97.7°F | Wt 163.1 lb

## 2016-09-04 DIAGNOSIS — Z23 Encounter for immunization: Secondary | ICD-10-CM | POA: Diagnosis not present

## 2016-09-04 DIAGNOSIS — M1A00X0 Idiopathic chronic gout, unspecified site, without tophus (tophi): Secondary | ICD-10-CM | POA: Diagnosis not present

## 2016-09-04 DIAGNOSIS — R413 Other amnesia: Secondary | ICD-10-CM | POA: Diagnosis not present

## 2016-09-04 DIAGNOSIS — N183 Chronic kidney disease, stage 3 unspecified: Secondary | ICD-10-CM | POA: Insufficient documentation

## 2016-09-04 DIAGNOSIS — R600 Localized edema: Secondary | ICD-10-CM

## 2016-09-04 MED ORDER — FEBUXOSTAT 40 MG PO TABS: 40.0000 mg | ORAL_TABLET | Freq: Every day | ORAL | 0 refills | Status: DC

## 2016-09-04 MED ORDER — POTASSIUM CHLORIDE ER 10 MEQ PO TBCR: 10.0000 meq | EXTENDED_RELEASE_TABLET | Freq: Every day | ORAL | 0 refills | Status: DC | PRN

## 2016-09-04 MED ORDER — LORAZEPAM 0.5 MG PO TABS: 0.2500 mg | ORAL_TABLET | Freq: Three times a day (TID) | ORAL | 0 refills | Status: DC | PRN

## 2016-09-04 MED ORDER — FUROSEMIDE 20 MG PO TABS: 20.0000 mg | ORAL_TABLET | Freq: Every day | ORAL | 0 refills | Status: DC | PRN

## 2016-09-04 NOTE — Assessment & Plan Note (Addendum)
Refer to nephrologist; patient has had declined in GFR and increase in creatinine reading over last year; not tolerating increase in allopurinol

## 2016-09-04 NOTE — Assessment & Plan Note (Signed)
Stop allopurinol and start uloric; tart cherry juice might be helpful; refer to nephrology

## 2016-09-04 NOTE — Progress Notes (Signed)
BP 122/64   Pulse 94   Temp 97.7 F (36.5 C) (Oral)   Wt 163 lb 1.6 oz (74 kg)   SpO2 97%   BMI 29.83 kg/m    Subjective:    Patient ID: Jennifer Klein, female    DOB: 25-Apr-1941, 75 y.o.   MRN: [id number]  HPI: Jennifer Klein is a 75 y.o. female  Chief Complaint  Patient presents with  . Edema   Patient is here with her husband; she has had pitting edema of both feet; actually worse a few days ago; just swelled up; they went to the acute care and evaluated; she was treated with furosemide which brought the swelling down BP usually high at home  She sees Dr. Fletcher Anon for her a-fib and on carvedilol and Xarelto; no bleeding; doing well  Husband says that she has some anxiety and wonders if a little anxiety medicine would be appropriate; she does "little obsessive stuff", no cursing or behavioral issues otherwise; husband requests something to use PRN for anxiety; she has progressive dementia  She has gout and had not had any flares; she used to take uloric and it was changed to allopurinol because of cost; she has been switched and her creatinine has declined unfortunately  Depression screen [facility] 2/9 09/04/2016 05/04/2016 09/02/2015  Decreased Interest 0 0 3  Down, Depressed, Hopeless 0 0 1  PHQ - 2 Score 0 0 4  Altered sleeping - - 1  Tired, decreased energy - - 1  Change in appetite - - 0  Feeling bad or failure about yourself  - - 0  Trouble concentrating - - 3  Moving slowly or fidgety/restless - - 0  Suicidal thoughts - - 0  PHQ-9 Score - - 9   Relevant past medical, surgical, family and social history reviewed Past Medical History:  Diagnosis Date  . Alzheimer's dementia without behavioral disturbance 05/04/2016  . B12 deficiency 05/06/2016  . Gout   . Hemorrhoids   . Hyperlipidemia LDL goal <100 05/05/2016  . Hypertension   . Memory loss   . PAF (paroxysmal atrial fibrillation) ([place])    a. on Xarelto; b. 48-hr Holter 2014: NSR with PAF/flutter which happened at  least 2.26% of the time w/ associated tachycardia. Patient was asymptomatic; c. CHADS2VASc => 4 (HTN, age x 2, female)  . Senile osteoporosis   . Systolic dysfunction    a. echo 08/2013: EF 50-55%, mild MR, mildly dilated LA   Past Surgical History:  Procedure Laterality Date  . TUBAL LIGATION     Family History  Problem Relation Age of Onset  . Heart disease Father   . Gout Brother    Social History  Substance Use Topics  . Smoking status: Former Smoker    Packs/day: 0.25    Years: 1.00    Types: Cigarettes  . Smokeless tobacco: Never Used  . Alcohol use 0.0 oz/week     Comment: occassionally   Interim medical history since last visit reviewed. Allergies and medications reviewed  Review of Systems Per HPI unless specifically indicated above     Objective:    BP 122/64   Pulse 94   Temp 97.7 F (36.5 C) (Oral)   Wt 163 lb 1.6 oz (74 kg)   SpO2 97%   BMI 29.83 kg/m   Wt Readings from Last 3 Encounters:  09/04/16 163 lb 1.6 oz (74 kg)  07/07/16 159 lb 1.9 oz (72.2 kg)  06/05/16 164 lb 8 oz (74.6  kg)    Physical Exam  Constitutional: She appears well-developed and well-nourished.  Weight gain almost 8 pounds over last 5+ months  HENT:  Mouth/Throat: Mucous membranes are normal.  Eyes: No scleral icterus.  Neck: No thyromegaly present.  Cardiovascular: Normal rate.  An irregularly irregular rhythm present.  Pulmonary/Chest: Effort normal and breath sounds normal.  Abdominal: She exhibits no distension.  Musculoskeletal: She exhibits edema (trace lower extremity edema, 1+ pedal edema).  Neurological: She is alert. She displays no tremor.  Limited historian  Skin: No pallor.  Psychiatric: Her mood appears not anxious. Her speech is not rapid and/or pressured. She is not agitated and not aggressive. Cognition and memory are impaired. She does not express impulsivity. She does not exhibit a depressed mood. She exhibits abnormal recent memory.  Poor historian She is  attentive.   Results for orders placed or performed in visit on 05/06/16  Lipid Profile  Result Value Ref Range   Cholesterol, Total 133 100 - 199 mg/dL   Triglycerides 151 (H) 0 - 149 mg/dL   HDL 40 >39 mg/dL   VLDL Cholesterol Cal 30 5 - 40 mg/dL   LDL Calculated 63 0 - 99 mg/dL   Chol/HDL Ratio 3.3 0.0 - 4.4 ratio units  Uric acid  Result Value Ref Range   Uric Acid 7.6 (H) 2.5 - 7.1 mg/dL  ALT  Result Value Ref Range   ALT 11 0 - 32 IU/L  Basic metabolic panel  Result Value Ref Range   Glucose 113 (H) 65 - 99 mg/dL   BUN 19 8 - 27 mg/dL   Creatinine, Ser 1.08 (H) 0.57 - 1.00 mg/dL   GFR calc non Af Amer 50 (L) >59 mL/min/1.73   GFR calc Af Amer 58 (L) >59 mL/min/1.73   BUN/Creatinine Ratio 18 12 - 28   Sodium 143 134 - 144 mmol/L   Potassium 3.4 (L) 3.5 - 5.2 mmol/L   Chloride 99 96 - 106 mmol/L   CO2 24 18 - 29 mmol/L   Calcium 8.9 8.7 - 10.3 mg/dL  Specimen status report  Result Value Ref Range   specimen status report Comment       Assessment & Plan:   Problem List Items Addressed This Visit      Genitourinary   CKD (chronic kidney disease) stage 3, GFR 30-59 ml/min    Refer to nephrologist; patient has had declined in GFR and increase in creatinine reading over last year; not tolerating increase in allopurinol      Relevant Orders   Ambulatory referral to Nephrology     Other   Memory loss    Dementia with some behavioral issues at this point; husband does not wish to see neurologist; no apparent hallucinations; will use very low dose benzo sporadically for agitation; I think that will be safer alternative than an atypical antipsychotic, and we trying to keep her out of nursing home      Gout - Primary    Stop allopurinol and start uloric; tart cherry juice might be helpful; refer to nephrology      Relevant Orders   Ambulatory referral to Nephrology   Bilateral leg edema    Will have him try wrapping with ace-type bandages when swollen, elevate feet  up above hips when sitting for prolonged periods, walk and move frequently; use furosemide only very sparingly; if swelling persists, will get echo; nothing suggestive of DVT on exam       Other Visit Diagnoses  Needs flu shot       Relevant Orders   Flu vaccine HIGH DOSE PF (Fluzone High dose) (Completed)      Follow up plan: Return in about 3 months (around 12/04/2016) for follow-up.  An after-visit summary was printed and given to the patient at [place].  Please see the patient instructions which may contain other information and recommendations beyond what is mentioned above in the assessment and plan.  Meds ordered this encounter  Medications  . febuxostat (ULORIC) 40 MG tablet    Sig: Take 1 tablet (40 mg total) by mouth daily. (for gout, replaces allopurinol)    Dispense:  30 tablet    Refill:  0    STOP allopurinol  . potassium chloride (KLOR-CON 10) 10 MEQ tablet    Sig: Take 1 tablet (10 mEq total) by mouth daily as needed. On the days that she takes a furosemide (fluid pill)    Dispense:  20 tablet    Refill:  0  . furosemide (LASIX) 20 MG tablet    Sig: Take 1 tablet (20 mg total) by mouth daily as needed. For fluid    Dispense:  20 tablet    Refill:  0  . LORazepam (ATIVAN) 0.5 MG tablet    Sig: Take 0.5 tablets (0.25 mg total) by mouth every 8 (eight) hours as needed for anxiety.    Dispense:  20 tablet    Refill:  0    Orders Placed This Encounter  Procedures  . Flu vaccine HIGH DOSE PF (Fluzone High dose)  . Ambulatory referral to Nephrology

## 2016-09-04 NOTE — Patient Instructions (Signed)
If you need something for aches or pains, try to use Tylenol (acetaminophen) instead of non-steroidals (which include Aleve, ibuprofen, Advil, Motrin, and naproxen); non-steroidals can cause long-term kidney damage  STOP allopurinol START Uloric She should have labs checked 2 weeks after starting Uloric Try the ACE wrap for the swelling if needed Elevate feet and ankles up above the hips for thirty minutes from time to time, with a few calf exercises from time to time

## 2016-09-08 ENCOUNTER — Other Ambulatory Visit: Payer: Self-pay | Admitting: Family Medicine

## 2016-09-08 NOTE — Telephone Encounter (Signed)
Relevant 08/11/16 labs reviewed, rx approved

## 2016-09-09 DIAGNOSIS — R600 Localized edema: Secondary | ICD-10-CM | POA: Insufficient documentation

## 2016-09-09 NOTE — Assessment & Plan Note (Signed)
Dementia with some behavioral issues at this point; husband does not wish to see neurologist; no apparent hallucinations; will use very low dose benzo sporadically for agitation; I think that will be safer alternative than an atypical antipsychotic, and we trying to keep her out of nursing home

## 2016-09-09 NOTE — Assessment & Plan Note (Signed)
Will have him try wrapping with ace-type bandages when swollen, elevate feet up above hips when sitting for prolonged periods, walk and move frequently; use furosemide only very sparingly; if swelling persists, will get echo; nothing suggestive of DVT on exam

## 2016-09-15 ENCOUNTER — Other Ambulatory Visit: Payer: Self-pay | Admitting: Family Medicine

## 2016-10-21 ENCOUNTER — Other Ambulatory Visit: Payer: Self-pay | Admitting: Family Medicine

## 2016-10-21 NOTE — Telephone Encounter (Signed)
Pt would like a call back about medication request.

## 2016-10-21 NOTE — Telephone Encounter (Signed)
I spoke with patient's husband She has an appt with the kidney Doing better than before; no gout flares; drinking more water Explained that I would like labs; he wants to wait until they see kidney doctor and then have kidney doctor check labs If they will monitor, I'll respectfully ask them to prescribe too; he agrees

## 2016-10-21 NOTE — Telephone Encounter (Signed)
Dr. lada pt is requesting a call back regarding  Refill request on ULORIC 40mg 

## 2016-10-28 ENCOUNTER — Other Ambulatory Visit: Payer: Self-pay | Admitting: Family Medicine

## 2016-10-28 NOTE — Telephone Encounter (Signed)
She has barrett's esophagus

## 2016-11-02 DIAGNOSIS — R600 Localized edema: Secondary | ICD-10-CM | POA: Diagnosis not present

## 2016-11-02 DIAGNOSIS — I10 Essential (primary) hypertension: Secondary | ICD-10-CM | POA: Diagnosis not present

## 2016-11-02 DIAGNOSIS — N179 Acute kidney failure, unspecified: Secondary | ICD-10-CM | POA: Diagnosis not present

## 2016-11-09 DIAGNOSIS — H2513 Age-related nuclear cataract, bilateral: Secondary | ICD-10-CM | POA: Diagnosis not present

## 2016-11-09 DIAGNOSIS — H353131 Nonexudative age-related macular degeneration, bilateral, early dry stage: Secondary | ICD-10-CM | POA: Diagnosis not present

## 2016-11-17 ENCOUNTER — Other Ambulatory Visit: Payer: Self-pay | Admitting: Family Medicine

## 2016-11-17 ENCOUNTER — Other Ambulatory Visit: Payer: Self-pay | Admitting: *Deleted

## 2016-11-17 DIAGNOSIS — N179 Acute kidney failure, unspecified: Secondary | ICD-10-CM | POA: Diagnosis not present

## 2016-11-17 MED ORDER — CARVEDILOL 3.125 MG PO TABS: 3.1250 mg | ORAL_TABLET | Freq: Two times a day (BID) | ORAL | 3 refills | Status: DC

## 2016-11-18 ENCOUNTER — Other Ambulatory Visit: Payer: Self-pay

## 2016-11-18 ENCOUNTER — Other Ambulatory Visit: Payer: Self-pay | Admitting: Family Medicine

## 2016-11-18 NOTE — Telephone Encounter (Signed)
See 10/21/16 note; we're going to ask kidney doctor to prescribe and monitor this drug from now on; thank you

## 2016-11-18 NOTE — Telephone Encounter (Signed)
This document is blank, no meds requested; signing off

## 2016-11-19 DIAGNOSIS — L719 Rosacea, unspecified: Secondary | ICD-10-CM | POA: Diagnosis not present

## 2016-11-19 DIAGNOSIS — L97119 Non-pressure chronic ulcer of right thigh with unspecified severity: Secondary | ICD-10-CM | POA: Diagnosis not present

## 2016-11-19 NOTE — Telephone Encounter (Signed)
Patient's husband notified  

## 2016-11-24 ENCOUNTER — Other Ambulatory Visit: Payer: Self-pay | Admitting: Family Medicine

## 2016-11-30 ENCOUNTER — Telehealth: Payer: Self-pay | Admitting: Cardiovascular Disease

## 2016-11-30 DIAGNOSIS — I10 Essential (primary) hypertension: Secondary | ICD-10-CM | POA: Diagnosis not present

## 2016-11-30 DIAGNOSIS — R600 Localized edema: Secondary | ICD-10-CM | POA: Diagnosis not present

## 2016-11-30 DIAGNOSIS — N183 Chronic kidney disease, stage 3 (moderate): Secondary | ICD-10-CM | POA: Diagnosis not present

## 2016-11-30 DIAGNOSIS — N2581 Secondary hyperparathyroidism of renal origin: Secondary | ICD-10-CM | POA: Diagnosis not present

## 2016-11-30 NOTE — Telephone Encounter (Signed)
Patient wants to know if [name]'s carvedilol (COREG) 3.125 MG tablet strength is correct?

## 2016-11-30 NOTE — Telephone Encounter (Signed)
Confirmed coreg dosage of 3.125mg  twice daily with pt spouse, [name] (on [place]).

## 2016-12-04 ENCOUNTER — Ambulatory Visit: Payer: Medicare Other | Admitting: Family Medicine

## 2016-12-16 ENCOUNTER — Other Ambulatory Visit: Payer: Self-pay | Admitting: Family Medicine

## 2016-12-16 ENCOUNTER — Other Ambulatory Visit: Payer: Self-pay | Admitting: *Deleted

## 2016-12-16 MED ORDER — RIVAROXABAN 20 MG PO TABS: 20.0000 mg | ORAL_TABLET | Freq: Every day | ORAL | 1 refills | Status: DC

## 2016-12-22 ENCOUNTER — Other Ambulatory Visit: Payer: Self-pay | Admitting: Family Medicine

## 2016-12-23 NOTE — Telephone Encounter (Signed)
rx approved

## 2016-12-23 NOTE — Telephone Encounter (Signed)
Glitch in our system; I was not receiving electronic refill requests from Dec 13 until yesterday; addressed with IT; addressing now ------------------------------------ I was sick and out of the office in mid-December appt coming up in January [facility] web site reviewed; no other prescribers Rx approved

## 2016-12-31 DIAGNOSIS — L304 Erythema intertrigo: Secondary | ICD-10-CM | POA: Diagnosis not present

## 2016-12-31 DIAGNOSIS — L718 Other rosacea: Secondary | ICD-10-CM | POA: Diagnosis not present

## 2016-12-31 DIAGNOSIS — L98499 Non-pressure chronic ulcer of skin of other sites with unspecified severity: Secondary | ICD-10-CM | POA: Diagnosis not present

## 2017-01-19 ENCOUNTER — Ambulatory Visit (INDEPENDENT_AMBULATORY_CARE_PROVIDER_SITE_OTHER): Payer: Medicare Other | Admitting: Family Medicine

## 2017-01-19 ENCOUNTER — Encounter: Payer: Self-pay | Admitting: Family Medicine

## 2017-01-19 DIAGNOSIS — G308 Other Alzheimer's disease: Secondary | ICD-10-CM

## 2017-01-19 DIAGNOSIS — M1A00X0 Idiopathic chronic gout, unspecified site, without tophus (tophi): Secondary | ICD-10-CM | POA: Diagnosis not present

## 2017-01-19 DIAGNOSIS — M8580 Other specified disorders of bone density and structure, unspecified site: Secondary | ICD-10-CM

## 2017-01-19 DIAGNOSIS — K2270 Barrett's esophagus without dysplasia: Secondary | ICD-10-CM | POA: Diagnosis not present

## 2017-01-19 DIAGNOSIS — N183 Chronic kidney disease, stage 3 unspecified: Secondary | ICD-10-CM

## 2017-01-19 DIAGNOSIS — E538 Deficiency of other specified B group vitamins: Secondary | ICD-10-CM

## 2017-01-19 DIAGNOSIS — Z5181 Encounter for therapeutic drug level monitoring: Secondary | ICD-10-CM | POA: Diagnosis not present

## 2017-01-19 DIAGNOSIS — I480 Paroxysmal atrial fibrillation: Secondary | ICD-10-CM

## 2017-01-19 DIAGNOSIS — F0281 Dementia in other diseases classified elsewhere with behavioral disturbance: Secondary | ICD-10-CM | POA: Diagnosis not present

## 2017-01-19 DIAGNOSIS — E785 Hyperlipidemia, unspecified: Secondary | ICD-10-CM

## 2017-01-19 DIAGNOSIS — G309 Alzheimer's disease, unspecified: Principal | ICD-10-CM

## 2017-01-19 NOTE — Patient Instructions (Addendum)
[facility] [address] [place], [place] [address] Phone: [phone] ?Fax: [phone]  Check out the book The 36 Hour Day  Return on or shortly after February 12, 2017 for fasting labs

## 2017-01-19 NOTE — Progress Notes (Signed)
BP 126/62   Pulse 89   Temp 97.6 F (36.4 C) (Oral)   Resp 14   Wt 162 lb 5 oz (73.6 kg)   SpO2 97%   BMI 29.69 kg/m   Initial vitals were pulse ox in the 80s, heart rate of 131 Fingernail polish removed; pulse ox 97, heart rate 87, checked by MD  Subjective:    Patient ID: Jennifer Klein, female    DOB: 1941/06/22, 76 y.o.   MRN: [id number]  HPI: Jennifer Klein is a 76 y.o. female  Chief Complaint  Patient presents with  . Follow-up    3 mnths  . Ear Fullness    left ear    Husband is here with patient for follow-up  Seeing kidney doctor at [facility]; sees her for kidney disease and gout; uric acid 3.7 in Nov and more blood done in December; no worries patient's husband thinks with her kidney function; no recent gout flares  Atrial fibrillation; on xarelto; seeing Dr. Fletcher Anon (cardiologist) every six months; no chest pain, no SHOB; no bleeding on the Xarelto  She continues to have memory issues; very seldomly needs the benzo for agitation; they are moving to new place  Depression screen [facility] 2/9 01/19/2017 09/04/2016 05/04/2016 09/02/2015  Decreased Interest 0 0 0 3  Down, Depressed, Hopeless 0 0 0 1  PHQ - 2 Score 0 0 0 4  Altered sleeping - - - 1  Tired, decreased energy - - - 1  Change in appetite - - - 0  Feeling bad or failure about yourself  - - - 0  Trouble concentrating - - - 3  Moving slowly or fidgety/restless - - - 0  Suicidal thoughts - - - 0  PHQ-9 Score - - - 9   Relevant past medical, surgical, family and social history reviewed Past Medical History:  Diagnosis Date  . Alzheimer's dementia with behavioral disturbance 05/04/2016  . Alzheimer's dementia without behavioral disturbance 05/04/2016  . B12 deficiency 05/06/2016  . Gout   . Hemorrhoids   . Hyperlipidemia LDL goal <100 05/05/2016  . Hypertension   . Memory loss   . PAF (paroxysmal atrial fibrillation) ([place])    a. on Xarelto; b. 48-hr Holter 2014: NSR with PAF/flutter which happened at least 2.26%  of the time w/ associated tachycardia. Patient was asymptomatic; c. CHADS2VASc => 4 (HTN, age x 2, female)  . Paroxysmal atrial fibrillation ([place]) 08/07/2013  . Senile osteoporosis   . Systolic dysfunction    a. echo 08/2013: EF 50-55%, mild MR, mildly dilated LA   Past Surgical History:  Procedure Laterality Date  . TUBAL LIGATION     Family History  Problem Relation Age of Onset  . Heart disease Father   . Gout Brother    Social History  Substance Use Topics  . Smoking status: Former Smoker    Packs/day: 0.25    Years: 1.00    Types: Cigarettes  . Smokeless tobacco: Never Used  . Alcohol use 0.0 oz/week     Comment: occassionally   Interim medical history since last visit reviewed. Allergies and medications reviewed  Review of Systems Per HPI unless specifically indicated above     Objective:    BP 126/62   Pulse 89   Temp 97.6 F (36.4 C) (Oral)   Resp 14   Wt 162 lb 5 oz (73.6 kg)   SpO2 97%   BMI 29.69 kg/m   Wt Readings from Last 3 Encounters:  01/19/17 162 lb 5 oz (73.6 kg)  09/04/16 163 lb 1.6 oz (74 kg)  07/07/16 159 lb 1.9 oz (72.2 kg)    Physical Exam  Constitutional: She appears well-developed and well-nourished.  Weight loss less than 1 pound over last 4+ months  HENT:  Mouth/Throat: Mucous membranes are normal.  Eyes: No scleral icterus.  Neck: No thyromegaly present.  Cardiovascular: Normal rate.  An irregularly irregular rhythm present.  Pulmonary/Chest: Effort normal and breath sounds normal.  Abdominal: She exhibits no distension.  Musculoskeletal: She exhibits no edema (trace lower extremity and pedal edema).  Neurological: She is alert. She displays no tremor.  Limited historian; forgetful; after her fingernail polish was removed, she did not know that was done here and commented a few times about how she didn't know what happened to her nail polish  Skin: No pallor.  Psychiatric: Her mood appears not anxious. Her speech is not rapid  and/or pressured. She is not agitated and not aggressive. Cognition and memory are impaired. She does not express impulsivity. She does not exhibit a depressed mood. She exhibits abnormal recent memory.  Poor historian She is attentive.    Results for orders placed or performed in visit on 05/06/16  Lipid Profile  Result Value Ref Range   Cholesterol, Total 133 100 - 199 mg/dL   Triglycerides 151 (H) 0 - 149 mg/dL   HDL 40 >39 mg/dL   VLDL Cholesterol Cal 30 5 - 40 mg/dL   LDL Calculated 63 0 - 99 mg/dL   Chol/HDL Ratio 3.3 0.0 - 4.4 ratio units  Uric acid  Result Value Ref Range   Uric Acid 7.6 (H) 2.5 - 7.1 mg/dL  ALT  Result Value Ref Range   ALT 11 0 - 32 IU/L  Basic metabolic panel  Result Value Ref Range   Glucose 113 (H) 65 - 99 mg/dL   BUN 19 8 - 27 mg/dL   Creatinine, Ser 1.08 (H) 0.57 - 1.00 mg/dL   GFR calc non Af Amer 50 (L) >59 mL/min/1.73   GFR calc Af Amer 58 (L) >59 mL/min/1.73   BUN/Creatinine Ratio 18 12 - 28   Sodium 143 134 - 144 mmol/L   Potassium 3.4 (L) 3.5 - 5.2 mmol/L   Chloride 99 96 - 106 mmol/L   CO2 24 18 - 29 mmol/L   Calcium 8.9 8.7 - 10.3 mg/dL  Specimen status report  Result Value Ref Range   specimen status report Comment       Assessment & Plan:   Problem List Items Addressed This Visit      Cardiovascular and Mediastinum   Paroxysmal atrial fibrillation (HCC)    Aberration with pulse ox and heart rate was I believe due to the thick fingernail polish being on when vitals were taken with pulse ox; once polish removed, vitals normal; she looked completely nontoxic, and was asymtpomatic        Digestive   Barrett's esophagus    Patient was inadvertently taken off of PPI; likely because of concerns of her bones and dementia and kidney function; I do not see a recent EGD; will start her back on PPI and refer back to Dr. Allen Norris for management; this was discovered after appointment, and I tried to call husband, but their phone would not  accept a call from an unlisted number        Nervous and Auditory   Alzheimer's dementia with behavioral disturbance    Progressive; receives care from her  husband; using infrequent benzo for mild agitation; redirection; they are moving into a new place soon, which may cause disruption and temporary worsening of her condition; husband may keep me posted        Musculoskeletal and Integument   Osteopenia    Last vitamin D was adequate; last DEXA was 2013; will order      Relevant Orders   DG Bone Density     Genitourinary   CKD (chronic kidney disease) stage 3, GFR 30-59 ml/min    Managed by nephrologist; avoid NSAIDs        Other   Medication monitoring encounter    Monitor sgpt, Cr, K+      Relevant Orders   COMPLETE METABOLIC PANEL WITH GFR   CBC with Differential/Platelet   Hyperlipidemia LDL goal <100    Check fasting lipids; limit saturated fats      Relevant Orders   Lipid panel   Gout    Managed by nephrologist      B12 deficiency    Will check B12 and supplement if indicated      Relevant Orders   Vitamin B12       Follow up plan: Return in about 3 weeks (around 02/12/2017) for fasting labs only; return in 4 months to see Dr. Sanda Klein.  An after-visit summary was printed and given to the patient at [facility].  Please see the patient instructions which may contain other information and recommendations beyond what is mentioned above in the assessment and plan.  Meds ordered this encounter  Medications  . DISCONTD: ranitidine (ZANTAC) 75 MG tablet    Sig: Take 75 mg by mouth 2 (two) times daily.  . pantoprazole (PROTONIX) 40 MG tablet    Sig: Take 1 tablet (40 mg total) by mouth daily. This replaces Zantac (ranitidine)    Dispense:  30 tablet    Refill:  5    Orders Placed This Encounter  Procedures  . DG Bone Density  . Lipid panel  . Vitamin B12  . COMPLETE METABOLIC PANEL WITH GFR  . CBC with Differential/Platelet

## 2017-01-31 ENCOUNTER — Encounter: Payer: Self-pay | Admitting: Family Medicine

## 2017-01-31 ENCOUNTER — Telehealth: Payer: Self-pay | Admitting: Family Medicine

## 2017-01-31 DIAGNOSIS — K2270 Barrett's esophagus without dysplasia: Secondary | ICD-10-CM

## 2017-01-31 MED ORDER — PANTOPRAZOLE SODIUM 40 MG PO TBEC: 40.0000 mg | DELAYED_RELEASE_TABLET | Freq: Every day | ORAL | 5 refills | Status: DC

## 2017-01-31 NOTE — Assessment & Plan Note (Signed)
Managed by nephrologist 

## 2017-01-31 NOTE — Assessment & Plan Note (Signed)
Check fasting lipids; limit saturated fats 

## 2017-01-31 NOTE — Assessment & Plan Note (Signed)
Progressive; receives care from her husband; using infrequent benzo for mild agitation; redirection; they are moving into a new place soon, which may cause disruption and temporary worsening of her condition; husband may keep me posted

## 2017-01-31 NOTE — Assessment & Plan Note (Signed)
Aberration with pulse ox and heart rate was I believe due to the thick fingernail polish being on when vitals were taken with pulse ox; once polish removed, vitals normal; she looked completely nontoxic, and was asymtpomatic

## 2017-01-31 NOTE — Assessment & Plan Note (Signed)
Managed by nephrologist; avoid NSAIDs 

## 2017-01-31 NOTE — Assessment & Plan Note (Signed)
Monitor sgpt, Cr, K+ 

## 2017-01-31 NOTE — Assessment & Plan Note (Signed)
Will check B12 and supplement if indicated

## 2017-01-31 NOTE — Assessment & Plan Note (Signed)
Patient was inadvertently taken off of PPI; likely because of concerns of her bones and dementia and kidney function; I do not see a recent EGD; will start her back on PPI and refer back to Dr. Allen Norris for management; this was discovered after appointment, and I tried to call husband, but their phone would not accept a call from an unlisted number

## 2017-01-31 NOTE — Telephone Encounter (Signed)
I tried to call husband to discuss DEXA scan (he can call to order) and the PPI; could not get through (they do not accept calls from numbers that aren't identified) I will call tomorrow from the office

## 2017-01-31 NOTE — Assessment & Plan Note (Signed)
Last vitamin D was adequate; last DEXA was 2013; will order

## 2017-02-01 NOTE — Telephone Encounter (Signed)
I called and left message; will try him tomorrow, or he may call me back

## 2017-02-02 NOTE — Assessment & Plan Note (Signed)
Will refer to GI; back on PPI

## 2017-02-02 NOTE — Telephone Encounter (Signed)
I spoke with patient's husband They tried stopping Prevacid; the kidney doctor told her to take two zantac a day; since she has Barrett's he told her to take two a day; no reflux I urged him to have her get an EGD; discussed monitoring for esophageal cancer; he okayed the EGD; he'll decide when to go Husband to schedule your bone density study; the number to schedule one at either [facility] or [facility] is [phone]

## 2017-02-04 ENCOUNTER — Telehealth: Payer: Self-pay | Admitting: Family Medicine

## 2017-02-04 MED ORDER — OSELTAMIVIR PHOSPHATE 75 MG PO CAPS: 75.0000 mg | ORAL_CAPSULE | Freq: Two times a day (BID) | ORAL | 0 refills | Status: AC

## 2017-02-04 NOTE — Telephone Encounter (Signed)
Oh no, I am so sorry to hear that she may have the flu; I have already sent in the Rx for Tamiflu Vitamin C and hydration are encouraged, along with rest If she develops trouble breathing, deep cough, dehydration, etc, anything worrisome, take her to the hospital This year's flu is dangerous The sooner he can have her start the Tamiflu, the better our chances are I'm not his doctor, but he should consider contacting his doctor to get the prophylactic dose of Tamiflu if appropriate; I don't know his situation or circumstances or allergies, so just encourage him to call his doctor Thank you

## 2017-02-04 NOTE — Telephone Encounter (Signed)
Patient/husband notified.

## 2017-02-04 NOTE — Telephone Encounter (Signed)
[name] (husband) states that he think wife has the flu. I offered an appointment for this this afternoon with Dr Manuella Ghazi but he declined. Stated that he did not want to bring her out. Stated that he just need you to dx her and prescribe tamiflu. Uses [facility].

## 2017-02-12 ENCOUNTER — Ambulatory Visit: Payer: Medicare Other | Admitting: Family Medicine

## 2017-02-16 ENCOUNTER — Other Ambulatory Visit: Payer: Self-pay | Admitting: Cardiovascular Disease

## 2017-02-23 ENCOUNTER — Other Ambulatory Visit: Payer: Self-pay

## 2017-02-23 MED ORDER — CARVEDILOL 3.125 MG PO TABS: 3.1250 mg | ORAL_TABLET | Freq: Two times a day (BID) | ORAL | 3 refills | Status: DC

## 2017-03-19 ENCOUNTER — Telehealth: Payer: Self-pay | Admitting: Family Medicine

## 2017-03-19 NOTE — Telephone Encounter (Signed)
The benzo order says scheduled instead of PRN That was not intended to be scheduled I contact assisted living Will decrease lorazepam to 0.5 mg every 12 hours scheduled, as I don't think we should stop TID abruptly, and I don't think she would ask for it Faxed new order to [facility]

## 2017-03-20 ENCOUNTER — Other Ambulatory Visit: Payer: Self-pay | Admitting: Family Medicine

## 2017-03-22 ENCOUNTER — Other Ambulatory Visit: Payer: Self-pay | Admitting: Cardiovascular Disease

## 2017-03-22 ENCOUNTER — Emergency Department
Admission: EM | Admit: 2017-03-22 | Discharge: 2017-03-22 | Disposition: A | Discharge status: Home / Self Care | Payer: Medicare Other | Attending: Emergency Medicine | Admitting: Emergency Medicine

## 2017-03-22 ENCOUNTER — Encounter: Payer: Self-pay | Admitting: Emergency Medicine

## 2017-03-22 DIAGNOSIS — I129 Hypertensive chronic kidney disease with stage 1 through stage 4 chronic kidney disease, or unspecified chronic kidney disease: Secondary | ICD-10-CM | POA: Insufficient documentation

## 2017-03-22 DIAGNOSIS — Z743 Need for continuous supervision: Secondary | ICD-10-CM | POA: Diagnosis not present

## 2017-03-22 DIAGNOSIS — R6889 Other general symptoms and signs: Secondary | ICD-10-CM | POA: Diagnosis not present

## 2017-03-22 DIAGNOSIS — G309 Alzheimer's disease, unspecified: Secondary | ICD-10-CM | POA: Insufficient documentation

## 2017-03-22 DIAGNOSIS — R109 Unspecified abdominal pain: Secondary | ICD-10-CM | POA: Insufficient documentation

## 2017-03-22 DIAGNOSIS — Z0000 Encounter for general adult medical examination without abnormal findings: Secondary | ICD-10-CM

## 2017-03-22 DIAGNOSIS — R300 Dysuria: Secondary | ICD-10-CM | POA: Insufficient documentation

## 2017-03-22 DIAGNOSIS — Z87891 Personal history of nicotine dependence: Secondary | ICD-10-CM | POA: Insufficient documentation

## 2017-03-22 DIAGNOSIS — N183 Chronic kidney disease, stage 3 (moderate): Secondary | ICD-10-CM | POA: Diagnosis not present

## 2017-03-22 LAB — CBC WITH DIFFERENTIAL/PLATELET
BASOS ABS: 0 10*3/uL (ref 0–0.1)
Basophils Relative: 0 %
EOS ABS: 0.2 10*3/uL (ref 0–0.7)
EOS PCT: 2 %
HCT: 36.3 % (ref 35.0–47.0)
HEMOGLOBIN: 12.2 g/dL (ref 12.0–16.0)
LYMPHS ABS: 1.2 10*3/uL (ref 1.0–3.6)
LYMPHS PCT: 12 %
MCH: 30.2 pg (ref 26.0–34.0)
MCHC: 33.6 g/dL (ref 32.0–36.0)
MCV: 90 fL (ref 80.0–100.0)
MONO ABS: 0.9 10*3/uL (ref 0.2–0.9)
Monocytes Relative: 10 %
NEUTROS ABS: 7.5 10*3/uL — AB (ref 1.4–6.5)
Neutrophils Relative %: 76 %
Platelets: 270 10*3/uL (ref 150–440)
RBC: 4.03 MIL/uL (ref 3.80–5.20)
RDW: 14.4 % (ref 11.5–14.5)
WBC: 9.8 10*3/uL (ref 3.6–11.0)

## 2017-03-22 LAB — COMPREHENSIVE METABOLIC PANEL
ALBUMIN: 3.3 g/dL — AB (ref 3.5–5.0)
ALT: 17 U/L (ref 14–54)
AST: 24 U/L (ref 15–41)
Alkaline Phosphatase: 102 U/L (ref 38–126)
Anion gap: 5 (ref 5–15)
BILIRUBIN TOTAL: 0.6 mg/dL (ref 0.3–1.2)
BUN: 23 mg/dL — ABNORMAL HIGH (ref 6–20)
CHLORIDE: 109 mmol/L (ref 101–111)
CO2: 28 mmol/L (ref 22–32)
Calcium: 8.4 mg/dL — ABNORMAL LOW (ref 8.9–10.3)
Creatinine, Ser: 1.12 mg/dL — ABNORMAL HIGH (ref 0.44–1.00)
GFR calc Af Amer: 53 mL/min — ABNORMAL LOW (ref >60)
GFR calc non Af Amer: 46 mL/min — ABNORMAL LOW (ref >60)
GLUCOSE: 105 mg/dL — AB (ref 65–99)
Potassium: 3.4 mmol/L — ABNORMAL LOW (ref 3.5–5.1)
Sodium: 142 mmol/L (ref 135–145)
TOTAL PROTEIN: 6.1 g/dL — AB (ref 6.5–8.1)

## 2017-03-22 LAB — URINALYSIS, COMPLETE (UACMP) WITH MICROSCOPIC
BACTERIA UA: NONE SEEN
BILIRUBIN URINE: NEGATIVE
Glucose, UA: NEGATIVE mg/dL
Hgb urine dipstick: NEGATIVE
KETONES UR: NEGATIVE mg/dL
LEUKOCYTES UA: NEGATIVE
Nitrite: NEGATIVE
PH: 5 (ref 5.0–8.0)
PROTEIN: NEGATIVE mg/dL
Specific Gravity, Urine: 1.029 (ref 1.005–1.030)

## 2017-03-22 NOTE — ED Notes (Signed)
Spoke with nurse at [facility]. Nurse stated pt was complaining of abdominal pain and nurse contacted pt's doctor. Pt's doctor wrote order for pt to be sent to ER and examined for UTI. Pt has dementia and is a poor historian. No family at bedside. POA called by facility nurse Theadora Rama and they are out of town.

## 2017-03-22 NOTE — ED Provider Notes (Signed)
[facility] Emergency Department Provider Note  Time seen: 5:35 PM  I have reviewed the triage vital signs and the nursing notes.   HISTORY  Chief Complaint Abdominal Pain    HPI CURTIS URIARTE is a 76 y.o. female with a past medical history of dementia, hypertension, hyperlipidemia, paroxysmal atrial fibrillation who presents to the emergency department for possible urinary tract infection. According to EMS patient was sent from [facility] for possible urinary tract infection. They state earlier today the patient was complaining of burning with urination. Currently the patient denies any symptoms. She has significant dementia and cannot contribute much to her history. Denies any pain. She is calm and comfortable lying in bed in no distress.  Past Medical History:  Diagnosis Date  . Alzheimer's dementia with behavioral disturbance 05/04/2016  . Alzheimer's dementia without behavioral disturbance 05/04/2016  . B12 deficiency 05/06/2016  . Gout   . Hemorrhoids   . Hyperlipidemia LDL goal <100 05/05/2016  . Hypertension   . Memory loss   . PAF (paroxysmal atrial fibrillation) ([place])    a. on Xarelto; b. 48-hr Holter 2014: NSR with PAF/flutter which happened at least 2.26% of the time w/ associated tachycardia. Patient was asymptomatic; c. CHADS2VASc => 4 (HTN, age x 2, female)  . Paroxysmal atrial fibrillation ([place]) 08/07/2013  . Senile osteoporosis   . Systolic dysfunction    a. echo 08/2013: EF 50-55%, mild MR, mildly dilated LA    Patient Active Problem List   Diagnosis Date Noted  . CKD (chronic kidney disease) stage 3, GFR 30-59 ml/min 09/04/2016  . B12 deficiency 05/06/2016  . Hyperlipidemia LDL goal <100 05/05/2016  . Alzheimer's dementia with behavioral disturbance 05/04/2016  . Medication monitoring encounter 05/04/2016  . Barrett's esophagus 11/09/2015  . Paroxysmal atrial fibrillation ([place]) 08/07/2013  . Osteopenia 08/08/2012  .  Hypertension 06/03/2012  . Gout 06/03/2012    Past Surgical History:  Procedure Laterality Date  . TUBAL LIGATION      Prior to Admission medications   Medication Sig Start Date End Date Taking? Authorizing Provider  atorvastatin (LIPITOR) 20 MG tablet TAKE 1 TABLET BY MOUTH AT BEDTIME 09/08/16  Yes Arnetha Courser, MD  carvedilol (COREG) 3.125 MG tablet Take 1 tablet (3.125 mg total) by mouth 2 (two) times daily with a meal. 02/23/17  Yes Wellington Hampshire, MD  Cholecalciferol (VITAMIN D3) 2000 units TABS Take 4,000 Units by mouth daily.   Yes Historical Provider, MD  donepezil (ARICEPT) 10 MG tablet TAKE 1 TABLET BY MOUTH AT BEDTIME. Patient taking differently: TAKE 1 TABLET BY MOUTH DAILY. 06/30/16  Yes Arnetha Courser, MD  loratadine (CLARITIN) 10 MG tablet Take 10 mg by mouth daily.   Yes Historical Provider, MD  LORazepam (ATIVAN) 0.5 MG tablet Take 1 tablet (0.5 mg total) by mouth every 12 (twelve) hours as needed for anxiety. 03/19/17  Yes Arnetha Courser, MD  memantine (NAMENDA) 10 MG tablet TAKE 1 TABLET BY MOUTH TWICE DAILY 12/23/16  Yes Arnetha Courser, MD  pantoprazole (PROTONIX) 40 MG tablet Take 1 tablet (40 mg total) by mouth daily. This replaces Zantac (ranitidine) 01/31/17  Yes Arnetha Courser, MD  ULORIC 40 MG tablet TAKE 1 TABLET BY MOUTH ONCE DAILY 10/21/16  Yes Arnetha Courser, MD  XARELTO 20 MG TABS tablet TAKE 1 TABLET BY MOUTH ONCE DAILY WITH SUPPER 03/22/17  Yes Wellington Hampshire, MD    Allergies  Allergen Reactions  . Darvon [Propoxyphene  Hcl]     As a child    Family History  Problem Relation Age of Onset  . Heart disease Father   . Gout Brother     Social History Social History  Substance Use Topics  . Smoking status: Former Smoker    Packs/day: 0.25    Years: 1.00    Types: Cigarettes  . Smokeless tobacco: Never Used  . Alcohol use 0.0 oz/week     Comment: occassionally    Review of Systems Constitutional: Negative for fever. Cardiovascular: Negative for  chest pain. Gastrointestinal: Denies abdominal pain. Genitourinary: Patient denies dysuria although dysuria reported at facility, patient has dementia and is likely unreliable historian 10-point ROS otherwise negative, but limited by dementia.  ____________________________________________   PHYSICAL EXAM:  VITAL SIGNS: ED Triage Vitals  Enc Vitals Group     BP 03/22/17 1617 (!) 151/92     Pulse Rate 03/22/17 1617 (!) 108     Resp 03/22/17 1617 20     Temp 03/22/17 1617 98 F (36.7 C)     Temp Source 03/22/17 1617 Oral     SpO2 03/22/17 1617 97 %     Weight 03/22/17 1618 160 lb (72.6 kg)     Height 03/22/17 1618 5\' 6"  (1.676 m)     Head Circumference --      Peak Flow --      Pain Score --      Pain Loc --      Pain Edu? --      Excl. in [place]? --     Constitutional: Alert. Well appearing and in no distress.Pleasant. Calm and cooperative. Eyes: Normal exam ENT   Head: Normocephalic and atraumatic.   Mouth/Throat: Mucous membranes are moist. Cardiovascular: Irregular rhythm, rate around 90 bpm. Respiratory: Normal respiratory effort without tachypnea nor retractions. Breath sounds are clear Gastrointestinal: Soft and nontender. No distention Musculoskeletal: Nontender with normal range of motion in all extremities.  Neurologic:  Normal speech and language. No gross focal neurologic deficits Skin:  Skin is warm, dry and intact.  Psychiatric: Mood and affect are normal.   ____________________________________________   INITIAL IMPRESSION / ASSESSMENT AND PLAN / ED COURSE  Pertinent labs & imaging results that were available during my care of the patient were reviewed by me and considered in my medical decision making (see chart for details).  Patient presents to the emergency department with concerns of possible urinary tract infection. According to the patient's nursing home per EMS report the patient was complaining of dysuria earlier today. Patient currently denies  dysuria but has dementia and is likely an unreliable historian. Denies any abdominal pain or chest pain. Denies fever.  Patient's labs are largely within normal limits. Urinalysis is negative urine culture has been sent. Patient's blood work is largely at baseline. Patient has no complaints in the emergency department, nontender abdomen, denies any dysuria here. Given the patient's normal workup with normal exam and no complaints we will discharge back to her nursing facility with PCP follow-up.  ____________________________________________   FINAL CLINICAL IMPRESSION(S) / ED DIAGNOSES  Dysuria Normal exam   Harvest Dark, MD 03/22/17 [phone]

## 2017-03-22 NOTE — Discharge Instructions (Signed)
You have been seen in the emergency department tonight, your workup has been normal including blood work and urinalysis. A urine culture has been sent as a precaution. Please follow-up with your primary care doctor in 1-2 days for recheck/reevaluation. Return to the emergency department for any Personally concerning symptoms to yourself or staff member.

## 2017-03-22 NOTE — ED Notes (Addendum)
Catheterization performed with NA II students with the supervision of tech Otis. Peri care performed pre catheterization. Noted redness around external and internal peri area.

## 2017-03-29 DIAGNOSIS — N183 Chronic kidney disease, stage 3 (moderate): Secondary | ICD-10-CM | POA: Diagnosis not present

## 2017-03-29 DIAGNOSIS — I10 Essential (primary) hypertension: Secondary | ICD-10-CM | POA: Diagnosis not present

## 2017-03-29 DIAGNOSIS — N2581 Secondary hyperparathyroidism of renal origin: Secondary | ICD-10-CM | POA: Diagnosis not present

## 2017-03-29 DIAGNOSIS — R600 Localized edema: Secondary | ICD-10-CM | POA: Diagnosis not present

## 2017-04-06 ENCOUNTER — Encounter: Payer: Self-pay | Admitting: Cardiovascular Disease

## 2017-04-06 ENCOUNTER — Ambulatory Visit (INDEPENDENT_AMBULATORY_CARE_PROVIDER_SITE_OTHER): Payer: Medicare Other | Admitting: Cardiovascular Disease

## 2017-04-06 VITALS — BP 124/62 | HR 120 | Ht 62.0 in | Wt 157.8 lb

## 2017-04-06 DIAGNOSIS — I482 Chronic atrial fibrillation, unspecified: Secondary | ICD-10-CM

## 2017-04-06 DIAGNOSIS — I10 Essential (primary) hypertension: Secondary | ICD-10-CM

## 2017-04-06 MED ORDER — METOPROLOL TARTRATE 25 MG PO TABS
25.0000 mg | ORAL_TABLET | Freq: Two times a day (BID) | ORAL | 5 refills | Status: DC
Start: 2017-04-06 — End: 2018-01-17

## 2017-04-06 NOTE — Progress Notes (Signed)
Cardiology Office Note   Date:  04/06/2017   ID:  Jennifer Klein, DOB 04/04/1941, MRN [id number]  PCP:  Enid Derry, MD  Cardiologist:   Kathlyn Sacramento, MD   Chief Complaint  Patient presents with  . other    6 month follow up. Meds reviewed by the pt. verbally. "doing well."       History of Present Illness: Jennifer Klein is a 76 y.o. female who presents for  a followup visit regarding chronic atrial fibrillation.  She has known history of hypertension and dementia.  Echocardiogram in September of 2014 showed low normal LV systolic function with an ejection fraction of 50-55%, mild mitral regurgitation and mildly dilated left atrium. She has known history of Barrett's esophagus with controlled symptoms. Her dementia continues to worsen and she was moved recently to [facility]. She went recently to the emergency room with abdominal pain with negative workup. She is doing well and denies any chest pain, shortness of breath or palpitations. She is in atrial fibrillation with rapid ventricular response today but she denies symptoms.   Past Medical History:  Diagnosis Date  . Alzheimer's dementia with behavioral disturbance 05/04/2016  . Alzheimer's dementia without behavioral disturbance 05/04/2016  . B12 deficiency 05/06/2016  . Gout   . Hemorrhoids   . Hyperlipidemia LDL goal <100 05/05/2016  . Hypertension   . Memory loss   . PAF (paroxysmal atrial fibrillation) ([place])    a. on Xarelto; b. 48-hr Holter 2014: NSR with PAF/flutter which happened at least 2.26% of the time w/ associated tachycardia. Patient was asymptomatic; c. CHADS2VASc => 4 (HTN, age x 2, female)  . Paroxysmal atrial fibrillation ([place]) 08/07/2013  . Senile osteoporosis   . Systolic dysfunction    a. echo 08/2013: EF 50-55%, mild MR, mildly dilated LA    Past Surgical History:  Procedure Laterality Date  . TUBAL LIGATION       Current Outpatient Prescriptions  Medication Sig Dispense Refill  .  atorvastatin (LIPITOR) 20 MG tablet TAKE 1 TABLET BY MOUTH AT BEDTIME 30 tablet 5  . carvedilol (COREG) 3.125 MG tablet Take 1 tablet (3.125 mg total) by mouth 2 (two) times daily with a meal. 60 tablet 3  . Cholecalciferol (VITAMIN D3) 2000 units TABS Take 4,000 Units by mouth daily.    Marland Kitchen donepezil (ARICEPT) 10 MG tablet TAKE 1 TABLET BY MOUTH AT BEDTIME. (Patient taking differently: TAKE 1 TABLET BY MOUTH DAILY.) 30 tablet 11  . loratadine (CLARITIN) 10 MG tablet Take 10 mg by mouth daily.    Marland Kitchen LORazepam (ATIVAN) 0.5 MG tablet Take 1 tablet (0.5 mg total) by mouth every 12 (twelve) hours as needed for anxiety.    . memantine (NAMENDA) 10 MG tablet TAKE 1 TABLET BY MOUTH TWICE DAILY 60 tablet 11  . pantoprazole (PROTONIX) 40 MG tablet Take 1 tablet (40 mg total) by mouth daily. This replaces Zantac (ranitidine) 30 tablet 5  . ULORIC 40 MG tablet TAKE 1 TABLET BY MOUTH ONCE DAILY 30 tablet 0  . XARELTO 20 MG TABS tablet TAKE 1 TABLET BY MOUTH ONCE DAILY WITH SUPPER 30 tablet 1   No current facility-administered medications for this visit.     Allergies:   Darvon [propoxyphene hcl]    Social History:  The patient  reports that she has quit smoking. Her smoking use included Cigarettes. She has a 0.25 pack-year smoking history. She has never used smokeless tobacco. She reports that she drinks alcohol. She  reports that she does not use drugs.   Family History:  The patient's family history includes Gout in her brother; Heart disease in her father.    ROS:  Please see the history of present illness.   Otherwise, review of systems are positive for none.   All other systems are reviewed and negative.    PHYSICAL EXAM: VS:  BP 124/62 (BP Location: Left Arm, Patient Position: Sitting, Cuff Size: Normal)   Pulse (!) 120   Ht 5\' 2"  (1.575 m)   Wt 157 lb 12 oz (71.6 kg)   BMI 28.85 kg/m  , BMI Body mass index is 28.85 kg/m. GEN: Well nourished, well developed, in no acute distress  HEENT:  normal  Neck: no JVD, carotid bruits, or masses Cardiac: Irregularly irregular and mildly tachycardic; no murmurs, rubs, or gallops,no edema  Respiratory:  clear to auscultation bilaterally, normal work of breathing GI: soft, nontender, nondistended, + BS MS: no deformity or atrophy  Skin: warm and dry, no rash Neuro:  Strength and sensation are intact Psych: euthymic mood, full affect   EKG:  EKG is ordered today. The ekg ordered today demonstrates atrial flutter with variable AV block. Right bundle branch block. Ventricular rate is 120 bpm.   Recent Labs: 05/04/2016: TSH 2.530 03/22/2017: ALT 17; BUN 23; Creatinine, Ser 1.12; Hemoglobin 12.2; Platelets 270; Potassium 3.4; Sodium 142    Lipid Panel    Component Value Date/Time   CHOL 133 08/11/2016 0824   TRIG 151 (H) 08/11/2016 0824   HDL 40 08/11/2016 0824   CHOLHDL 3.3 08/11/2016 0824   CHOLHDL 3 06/03/2012 1017   VLDL 45.6 (H) 06/03/2012 1017   LDLCALC 63 08/11/2016 0824   LDLDIRECT 137.4 06/03/2012 1017      Wt Readings from Last 3 Encounters:  04/06/17 157 lb 12 oz (71.6 kg)  03/22/17 160 lb (72.6 kg)  01/19/17 162 lb 5 oz (73.6 kg)        ASSESSMENT AND PLAN:  1.  Chronic atrial fibrillation: The patient seems to be now in chronic atrial fibrillation/flutter.  In spite of mild tachycardia, she is asymptomatic. I elected to switch carvedilol to metoprolol for better rate control. Continue long-term anticoagulation with Xarelto. I reviewed her recent labs which showed unremarkable CBC. Renal function is slightly worse and we have to consider decreasing the dose of Xarelto if this continues to be a permanent trend.  2. Essential hypertension: Blood pressure is controlled on current medications.   Disposition:   FU with me in 6 months  Signed,  Kathlyn Sacramento, MD  04/06/2017 2:57 PM    [facility]

## 2017-04-06 NOTE — Patient Instructions (Signed)
Medication Instructions:  Your physician has recommended you make the following change in your medication:  STOP taking coreg START taking metoprolol 25mg  twice daily  Labwork: none  Testing/Procedures: none  Follow-Up: Your physician wants you to follow-up in: 6 months with Dr. Fletcher Anon. You will receive a reminder letter in the mail two months in advance. If you don't receive a letter, please call our office to schedule the follow-up appointment.   Any Other Special Instructions Will Be Listed Below (If Applicable).     If you need a refill on your cardiac medications before your next appointment, please call your pharmacy.

## 2017-04-07 ENCOUNTER — Other Ambulatory Visit: Payer: Self-pay | Admitting: Family Medicine

## 2017-04-13 ENCOUNTER — Telehealth: Payer: Self-pay | Admitting: Family Medicine

## 2017-04-13 NOTE — Telephone Encounter (Signed)
Anderson Malta from [facility] is requesting H&P please fax to [phone] ([phone]

## 2017-04-14 NOTE — Telephone Encounter (Signed)
No physical on file from last year, will fax last office visit

## 2017-04-15 ENCOUNTER — Encounter: Payer: Self-pay | Admitting: *Deleted

## 2017-04-15 ENCOUNTER — Emergency Department: Payer: Medicare Other

## 2017-04-15 ENCOUNTER — Emergency Department
Admission: EM | Admit: 2017-04-15 | Discharge: 2017-04-15 | Disposition: A | Discharge status: Skilled Nursing Facility | Payer: Medicare Other | Attending: Emergency Medicine | Admitting: Emergency Medicine

## 2017-04-15 DIAGNOSIS — E860 Dehydration: Secondary | ICD-10-CM

## 2017-04-15 DIAGNOSIS — R0600 Dyspnea, unspecified: Secondary | ICD-10-CM | POA: Diagnosis not present

## 2017-04-15 DIAGNOSIS — N289 Disorder of kidney and ureter, unspecified: Secondary | ICD-10-CM | POA: Diagnosis not present

## 2017-04-15 DIAGNOSIS — R0989 Other specified symptoms and signs involving the circulatory and respiratory systems: Secondary | ICD-10-CM | POA: Diagnosis not present

## 2017-04-15 DIAGNOSIS — R1111 Vomiting without nausea: Secondary | ICD-10-CM | POA: Diagnosis not present

## 2017-04-15 DIAGNOSIS — I129 Hypertensive chronic kidney disease with stage 1 through stage 4 chronic kidney disease, or unspecified chronic kidney disease: Secondary | ICD-10-CM | POA: Insufficient documentation

## 2017-04-15 DIAGNOSIS — G309 Alzheimer's disease, unspecified: Secondary | ICD-10-CM | POA: Diagnosis not present

## 2017-04-15 DIAGNOSIS — Z87891 Personal history of nicotine dependence: Secondary | ICD-10-CM | POA: Insufficient documentation

## 2017-04-15 DIAGNOSIS — T17308A Unspecified foreign body in larynx causing other injury, initial encounter: Secondary | ICD-10-CM

## 2017-04-15 DIAGNOSIS — R05 Cough: Secondary | ICD-10-CM | POA: Diagnosis not present

## 2017-04-15 DIAGNOSIS — N183 Chronic kidney disease, stage 3 (moderate): Secondary | ICD-10-CM | POA: Insufficient documentation

## 2017-04-15 DIAGNOSIS — Z743 Need for continuous supervision: Secondary | ICD-10-CM | POA: Diagnosis not present

## 2017-04-15 LAB — URINALYSIS, COMPLETE (UACMP) WITH MICROSCOPIC
BACTERIA UA: NONE SEEN
Bilirubin Urine: NEGATIVE
Glucose, UA: NEGATIVE mg/dL
Hgb urine dipstick: NEGATIVE
Ketones, ur: NEGATIVE mg/dL
Leukocytes, UA: NEGATIVE
NITRITE: NEGATIVE
PH: 5 (ref 5.0–8.0)
Protein, ur: 30 mg/dL — AB
SPECIFIC GRAVITY, URINE: 1.028 (ref 1.005–1.030)

## 2017-04-15 LAB — CBC WITH DIFFERENTIAL/PLATELET
BASOS ABS: 0 10*3/uL (ref 0–0.1)
BASOS PCT: 0 %
EOS PCT: 1 %
Eosinophils Absolute: 0.1 10*3/uL (ref 0–0.7)
HEMATOCRIT: 41.5 % (ref 35.0–47.0)
Hemoglobin: 13.9 g/dL (ref 12.0–16.0)
LYMPHS PCT: 9 %
Lymphs Abs: 1.2 10*3/uL (ref 1.0–3.6)
MCH: 30 pg (ref 26.0–34.0)
MCHC: 33.5 g/dL (ref 32.0–36.0)
MCV: 89.6 fL (ref 80.0–100.0)
MONO ABS: 0.9 10*3/uL (ref 0.2–0.9)
MONOS PCT: 7 %
NEUTROS ABS: 10.5 10*3/uL — AB (ref 1.4–6.5)
Neutrophils Relative %: 83 %
PLATELETS: 277 10*3/uL (ref 150–440)
RBC: 4.63 MIL/uL (ref 3.80–5.20)
RDW: 14.5 % (ref 11.5–14.5)
WBC: 12.7 10*3/uL — ABNORMAL HIGH (ref 3.6–11.0)

## 2017-04-15 LAB — COMPREHENSIVE METABOLIC PANEL
ALBUMIN: 3.6 g/dL (ref 3.5–5.0)
ALT: 17 U/L (ref 14–54)
AST: 25 U/L (ref 15–41)
Alkaline Phosphatase: 108 U/L (ref 38–126)
Anion gap: 9 (ref 5–15)
BILIRUBIN TOTAL: 0.7 mg/dL (ref 0.3–1.2)
BUN: 29 mg/dL — AB (ref 6–20)
CHLORIDE: 105 mmol/L (ref 101–111)
CO2: 23 mmol/L (ref 22–32)
Calcium: 8.8 mg/dL — ABNORMAL LOW (ref 8.9–10.3)
Creatinine, Ser: 1.87 mg/dL — ABNORMAL HIGH (ref 0.44–1.00)
GFR calc Af Amer: 29 mL/min — ABNORMAL LOW (ref >60)
GFR calc non Af Amer: 25 mL/min — ABNORMAL LOW (ref >60)
GLUCOSE: 131 mg/dL — AB (ref 65–99)
POTASSIUM: 3.6 mmol/L (ref 3.5–5.1)
Sodium: 137 mmol/L (ref 135–145)
Total Protein: 6.4 g/dL — ABNORMAL LOW (ref 6.5–8.1)

## 2017-04-15 LAB — TROPONIN I

## 2017-04-15 MED ORDER — SODIUM CHLORIDE 0.9 % IV SOLN
Freq: Once | INTRAVENOUS | Status: AC
  Administered 2017-04-15: 14:00:00 via INTRAVENOUS

## 2017-04-15 NOTE — Discharge Instructions (Addendum)
Stay hydrated.   Her Kidney function is slightly abnormal (Cr 1.8 today). Repeat BMP in a week.   See your doctor.   Return to ER if she has fever, trouble breathing, vomiting, abdominal pain, lethargy.,

## 2017-04-15 NOTE — ED Notes (Signed)
Waiting on transportation

## 2017-04-15 NOTE — ED Provider Notes (Signed)
  Physical Exam  BP (!) 142/88 (BP Location: Left Arm)   Pulse 91   Temp 97.8 F (36.6 C) (Oral)   Resp 16   Ht 5\' 2"  (1.575 m)   Wt 157 lb (71.2 kg)   SpO2 97%   BMI 28.72 kg/m   Physical Exam  ED Course  Procedures  MDM Care assumed at 3pm from Dr. [name]. Patient was sent from nursing facility for coughing, possible aspiration. Patient is 97% on RA. CXR showed atelectasis, no definite infiltrate. WBC 13. Has acute renal failure with Cr 1.8. Sign out pending UA.  5:24 PM Patient had in and out cath for UA and showed no UTI. Given 1 L NS bolus. Patient demented but has no complaints currently. Given no fever and no lobar pneumonia on xray, will hold off on abx. Recommend recheck BMP in a week in the nursing home.       Drenda Freeze, MD 04/15/17 1725

## 2017-04-15 NOTE — ED Notes (Signed)
Pt returned from xray via stretcher.

## 2017-04-15 NOTE — ED Provider Notes (Signed)
[facility] Emergency Department Provider Note       Time seen: ----------------------------------------- 11:15 AM on 04/15/2017 -----------------------------------------  L5 caveat: Review of systems and history is limited by dementia   I have reviewed the triage vital signs and the nursing notes.   HISTORY   Chief Complaint Aspiration    HPI Jennifer Klein is a 76 y.o. female who presents to the ED for possible aspiration. Patient is brought from [facility], took medications and began coughing and vomiting thereafter. She does have history of dementia and cannot really answer questions accurately.   Past Medical History:  Diagnosis Date  . Alzheimer's dementia with behavioral disturbance 05/04/2016  . Alzheimer's dementia without behavioral disturbance 05/04/2016  . B12 deficiency 05/06/2016  . Gout   . Hemorrhoids   . Hyperlipidemia LDL goal <100 05/05/2016  . Hypertension   . Memory loss   . PAF (paroxysmal atrial fibrillation) ([place])    a. on Xarelto; b. 48-hr Holter 2014: NSR with PAF/flutter which happened at least 2.26% of the time w/ associated tachycardia. Patient was asymptomatic; c. CHADS2VASc => 4 (HTN, age x 2, female)  . Paroxysmal atrial fibrillation ([place]) 08/07/2013  . Senile osteoporosis   . Systolic dysfunction    a. echo 08/2013: EF 50-55%, mild MR, mildly dilated LA    Patient Active Problem List   Diagnosis Date Noted  . CKD (chronic kidney disease) stage 3, GFR 30-59 ml/min 09/04/2016  . B12 deficiency 05/06/2016  . Hyperlipidemia LDL goal <100 05/05/2016  . Alzheimer's dementia with behavioral disturbance 05/04/2016  . Medication monitoring encounter 05/04/2016  . Barrett's esophagus 11/09/2015  . Paroxysmal atrial fibrillation ([place]) 08/07/2013  . Osteopenia 08/08/2012  . Hypertension 06/03/2012  . Gout 06/03/2012    Past Surgical History:  Procedure Laterality Date  . TUBAL LIGATION      Allergies Darvon  [propoxyphene hcl]  Social History Social History  Substance Use Topics  . Smoking status: Former Smoker    Packs/day: 0.25    Years: 1.00    Types: Cigarettes  . Smokeless tobacco: Never Used  . Alcohol use 0.0 oz/week     Comment: occassionally    Review of Systems Review of systems for the most part is unknown, she was having difficulty swallowing earlier  All systems negative/normal/unremarkable except as stated in the HPI  ____________________________________________   PHYSICAL EXAM:  VITAL SIGNS: ED Triage Vitals  Enc Vitals Group     BP 04/15/17 1053 102/72     Pulse Rate 04/15/17 1053 82     Resp 04/15/17 1053 18     Temp 04/15/17 1053 97.8 F (36.6 C)     Temp Source 04/15/17 1053 Oral     SpO2 04/15/17 1053 100 %     Weight 04/15/17 1054 157 lb (71.2 kg)     Height 04/15/17 1054 5\' 2"  (1.575 m)     Head Circumference --      Peak Flow --      Pain Score --      Pain Loc --      Pain Edu? --      Excl. in [place]? --     Constitutional: Alert But disoriented. Well appearing and in no distress. Eyes: Conjunctivae are normal. PERRL. Normal extraocular movements. ENT   Head: Normocephalic and atraumatic.   Nose: No congestion/rhinnorhea.   Mouth/Throat: Mucous membranes are moist.   Neck: No stridor. Cardiovascular: Normal rate, regular rhythm. No murmurs, rubs, or gallops. Respiratory:  Normal respiratory effort without tachypnea nor retractions. Breath sounds are clear and equal bilaterally. No wheezes/rales/rhonchi. Gastrointestinal: Soft and nontender. Normal bowel sounds Musculoskeletal: Nontender with normal range of motion in extremities. No lower extremity tenderness nor edema. Neurologic:  Normal speech and language. No gross focal neurologic deficits are appreciated.  Skin:  Skin is warm, dry and intact. No rash noted. Psychiatric: Mood and affect are normal. Speech and behavior are normal.   ____________________________________________  EKG: Interpreted by me. Atrial flutter with a rate of 88 bpm, normal QRS size, normal QT, nonspecific T wave changes  ____________________________________________  ED COURSE:  Pertinent labs & imaging results that were available during my care of the patient were reviewed by me and considered in my medical decision making (see chart for details). Patient presents for possible aspiration, we will assess with labs and imaging as indicated. Clinical Course as of Apr 16 1412  Thu Apr 15, 2017  1404 Patient was found to be dehydrated with elevated creatinine. We have given IV fluids.  [JW]    Clinical Course User Index [JW] Jennifer Newport, MD   Procedures ____________________________________________   LABS (pertinent positives/negatives)  Labs Reviewed  CBC WITH DIFFERENTIAL/PLATELET - Abnormal; Notable for the following:       Result Value   WBC 12.7 (*)    Neutro Abs 10.5 (*)    All other components within normal limits  COMPREHENSIVE METABOLIC PANEL - Abnormal; Notable for the following:    Glucose, Bld 131 (*)    BUN 29 (*)    Creatinine, Ser 1.87 (*)    Calcium 8.8 (*)    Total Protein 6.4 (*)    GFR calc non Af Amer 25 (*)    GFR calc Af Amer 29 (*)    All other components within normal limits  URINALYSIS, COMPLETE (UACMP) WITH MICROSCOPIC - Abnormal; Notable for the following:    Color, Urine YELLOW (*)    APPearance CLOUDY (*)    Protein, ur 30 (*)    Squamous Epithelial / LPF 0-5 (*)    All other components within normal limits  TROPONIN I    RADIOLOGY Images were viewed by me  Chest x-ray  ____________________________________________  FINAL ASSESSMENT AND PLAN  Choking episode, dehydration  Plan: Patient's labs and imaging were dictated above. Patient had presented for a choking episode, final disposition is pending at this time. Patient is checked out to Dr. Darl Householder for final disposition   Jennifer Klein  Jennifer Liming, MD   Note: This note was generated in part or whole with voice recognition software. Voice recognition is usually quite accurate but there are transcription errors that can and very often do occur. I apologize for any typographical errors that were not detected and corrected.     Jennifer Newport, MD 04/16/17 [phone]

## 2017-04-15 NOTE — ED Notes (Signed)
Pt went to x-ray.

## 2017-04-15 NOTE — ED Notes (Signed)
Attempted to take pt to toilet to collect urine sample. Brought stretcher to bathroom door, had a hat, pt would not urinate. Dr. [name] notified and stated to do an in and out catheter to collect urine.

## 2017-04-15 NOTE — ED Notes (Signed)
Dr. [name] went to talk to pt and pt pulled out IV.

## 2017-04-15 NOTE — ED Notes (Signed)
Pt clothes had emesis on the R side of shirt and at top of pants,removed and placed in hospital gown.

## 2017-04-15 NOTE — ED Triage Notes (Signed)
Pt is from [facility], pt took medications began coughing and vomiting after, pt has dementia, is unable to answer questions

## 2017-04-16 DIAGNOSIS — I10 Essential (primary) hypertension: Secondary | ICD-10-CM | POA: Diagnosis not present

## 2017-04-16 DIAGNOSIS — R600 Localized edema: Secondary | ICD-10-CM | POA: Diagnosis not present

## 2017-04-16 DIAGNOSIS — N179 Acute kidney failure, unspecified: Secondary | ICD-10-CM | POA: Diagnosis not present

## 2017-04-22 ENCOUNTER — Other Ambulatory Visit: Payer: Self-pay | Admitting: Family Medicine

## 2017-04-22 MED ORDER — LORAZEPAM 0.5 MG PO TABS: 0.5000 mg | ORAL_TABLET | Freq: Two times a day (BID) | ORAL | 1 refills | Status: DC | PRN

## 2017-04-22 NOTE — Telephone Encounter (Signed)
Pt is needing her Lorazepam refilled . She is at the [facility] and they had been sending the request to the wrong provider.She is totally out  Per the facility where she stays. Pharm is Science writer in [place]

## 2017-04-22 NOTE — Telephone Encounter (Signed)
rx ready; asking for them to send me a copy of MAR at the end of each month so I may review usage

## 2017-05-05 ENCOUNTER — Other Ambulatory Visit: Payer: Self-pay | Admitting: Family Medicine

## 2017-05-05 NOTE — Telephone Encounter (Signed)
Please ask nursing home to send me a copy of their MAR so I can review and adjust if needed Thank you

## 2017-05-06 NOTE — Telephone Encounter (Signed)
Called [facility] and they will fax the [facility] over to [place].

## 2017-05-10 NOTE — Telephone Encounter (Signed)
They sent me the [facility] and it has NOT been given "PRN" as ordered It has been given around the clock I'll decrease her evening dose to just half of a pill and leave the AM alone and leave scheduled Please give them new orders: 1.  Discontinue current lorazepam orders 2.  Start lorazepam 0.5 mg by mouth daily at 0800 hours and 0.25 mg by mouth daily at 2000 hours

## 2017-05-10 NOTE — Telephone Encounter (Signed)
Fax over the order. Also spoke with Anderson Malta and went over the new instructions. Also faxed over written instructions as well. The RX will be faxed over to [facility] as well.

## 2017-05-11 ENCOUNTER — Encounter: Payer: Self-pay | Admitting: Family Medicine

## 2017-05-11 ENCOUNTER — Ambulatory Visit (INDEPENDENT_AMBULATORY_CARE_PROVIDER_SITE_OTHER): Payer: Medicare Other | Admitting: Family Medicine

## 2017-05-11 DIAGNOSIS — N183 Chronic kidney disease, stage 3 unspecified: Secondary | ICD-10-CM

## 2017-05-11 DIAGNOSIS — I10 Essential (primary) hypertension: Secondary | ICD-10-CM | POA: Diagnosis not present

## 2017-05-11 DIAGNOSIS — K2270 Barrett's esophagus without dysplasia: Secondary | ICD-10-CM

## 2017-05-11 DIAGNOSIS — M1A00X0 Idiopathic chronic gout, unspecified site, without tophus (tophi): Secondary | ICD-10-CM

## 2017-05-11 DIAGNOSIS — I480 Paroxysmal atrial fibrillation: Secondary | ICD-10-CM

## 2017-05-11 DIAGNOSIS — G309 Alzheimer's disease, unspecified: Secondary | ICD-10-CM

## 2017-05-11 DIAGNOSIS — F0281 Dementia in other diseases classified elsewhere with behavioral disturbance: Secondary | ICD-10-CM

## 2017-05-11 NOTE — Progress Notes (Signed)
BP 102/64   Pulse 67   Temp 98.6 F (37 C) (Oral)   Resp 14   Wt 156 lb (70.8 kg)   SpO2 92%   BMI 28.53 kg/m    Subjective:    Patient ID: Jennifer Klein, female    DOB: [birth year], 76 y.o.   MRN: [id number]  HPI: Jennifer Klein is a 76 y.o. female  Chief Complaint  Patient presents with  . Follow-up    4 months    HPI Patient is here for follow-up She is here with her husband; she resides in an assisted living facility, memory care unit  She sees Dr. Holley Raring; on losartan 25 mg daily Not really tired; she lays down a lot BP is 102/64 today; they don't check her BP I reviewed with him the recent GFR and creatinine readings  On memory medicine from primary; hard to tell if any real decline; husband sees her declining just a little bit Appetite is good she says  She was in the hospital twice in April, April 2nd and April 26th; stomach pain the first time; vomiting and dehydrated; reviewed notes, ER record  Sleeping okay; reduced dose of benzo  Depression screen [facility] 2/9 05/11/2017 01/19/2017 09/04/2016 05/04/2016 09/02/2015  Decreased Interest 0 0 0 0 3  Down, Depressed, Hopeless 0 0 0 0 1  PHQ - 2 Score 0 0 0 0 4  Altered sleeping - - - - 1  Tired, decreased energy - - - - 1  Change in appetite - - - - 0  Feeling bad or failure about yourself  - - - - 0  Trouble concentrating - - - - 3  Moving slowly or fidgety/restless - - - - 0  Suicidal thoughts - - - - 0  PHQ-9 Score - - - - 9    Relevant past medical, surgical, family and social history reviewed Past Medical History:  Diagnosis Date  . Alzheimer's dementia with behavioral disturbance 05/04/2016  . Alzheimer's dementia without behavioral disturbance 05/04/2016  . B12 deficiency 05/06/2016  . Gout   . Hemorrhoids   . Hyperlipidemia LDL goal <100 05/05/2016  . Hypertension   . Memory loss   . PAF (paroxysmal atrial fibrillation) ([place])    a. on Xarelto; b. 48-hr Holter 2014: NSR with PAF/flutter which happened  at least 2.26% of the time w/ associated tachycardia. Patient was asymptomatic; c. CHADS2VASc => 4 (HTN, age x 2, female)  . Paroxysmal atrial fibrillation ([place]) 08/07/2013  . Senile osteoporosis   . Systolic dysfunction    a. echo 08/2013: EF 50-55%, mild MR, mildly dilated LA   Past Surgical History:  Procedure Laterality Date  . TUBAL LIGATION     Family History  Problem Relation Age of Onset  . Heart disease Father   . Gout Brother    Social History   Social History  . Marital status: Married    Spouse name: N/A  . Number of children: N/A  . Years of education: N/A   Occupational History  . Not on file.   Social History Main Topics  . Smoking status: Former Smoker    Packs/day: 0.25    Years: 1.00    Types: Cigarettes  . Smokeless tobacco: Never Used  . Alcohol use 0.0 oz/week     Comment: occassionally  . Drug use: No  . Sexual activity: Not on file   Other Topics Concern  . Not on file   Social History Narrative  Lives with husband in [place].   Worked at [employer] and [employer].      Diet - regular         Interim medical history since last visit reviewed. Allergies and medications reviewed  Review of Systems Per HPI unless specifically indicated above     Objective:    BP 102/64   Pulse 67   Temp 98.6 F (37 C) (Oral)   Resp 14   Wt 156 lb (70.8 kg)   SpO2 92%   BMI 28.53 kg/m   Wt Readings from Last 3 Encounters:  05/11/17 156 lb (70.8 kg)  04/15/17 157 lb (71.2 kg)  04/06/17 157 lb 12 oz (71.6 kg)    Physical Exam  Constitutional: She appears well-developed and well-nourished.  Weight loss less than 1 pound over last 4+ months  HENT:  Mouth/Throat: Mucous membranes are normal.  Eyes: No scleral icterus.  Neck: No thyromegaly present.  Cardiovascular: Normal rate.  An irregularly irregular rhythm present.  Pulmonary/Chest: Effort normal and breath sounds normal.  Abdominal: She exhibits no distension.  Musculoskeletal: She exhibits no  edema (trace lower extremity and pedal edema).  Neurological: She is alert. She displays no tremor.  Limited historian; forgetful  Skin: She is not diaphoretic. No pallor.  Small superficial blistered area where thong of flip flop rubs against dorsum of foot, one on each foot (husband says new shoes and they are going to switch to other shoes)  Psychiatric: Her mood appears not anxious. Her speech is not rapid and/or pressured. She is not agitated and not aggressive. Cognition and memory are impaired. She does not express impulsivity. She does not exhibit a depressed mood. She exhibits abnormal recent memory.  Poor historian She is attentive.   Results for orders placed or performed during the hospital encounter of 04/15/17  CBC with Differential/Platelet  Result Value Ref Range   WBC 12.7 (H) 3.6 - 11.0 K/uL   RBC 4.63 3.80 - 5.20 MIL/uL   Hemoglobin 13.9 12.0 - 16.0 g/dL   HCT 41.5 35.0 - 47.0 %   MCV 89.6 80.0 - 100.0 fL   MCH 30.0 26.0 - 34.0 pg   MCHC 33.5 32.0 - 36.0 g/dL   RDW 14.5 11.5 - 14.5 %   Platelets 277 150 - 440 K/uL   Neutrophils Relative % 83 %   Neutro Abs 10.5 (H) 1.4 - 6.5 K/uL   Lymphocytes Relative 9 %   Lymphs Abs 1.2 1.0 - 3.6 K/uL   Monocytes Relative 7 %   Monocytes Absolute 0.9 0.2 - 0.9 K/uL   Eosinophils Relative 1 %   Eosinophils Absolute 0.1 0 - 0.7 K/uL   Basophils Relative 0 %   Basophils Absolute 0.0 0 - 0.1 K/uL  Comprehensive metabolic panel  Result Value Ref Range   Sodium 137 135 - 145 mmol/L   Potassium 3.6 3.5 - 5.1 mmol/L   Chloride 105 101 - 111 mmol/L   CO2 23 22 - 32 mmol/L   Glucose, Bld 131 (H) 65 - 99 mg/dL   BUN 29 (H) 6 - 20 mg/dL   Creatinine, Ser 1.87 (H) 0.44 - 1.00 mg/dL   Calcium 8.8 (L) 8.9 - 10.3 mg/dL   Total Protein 6.4 (L) 6.5 - 8.1 g/dL   Albumin 3.6 3.5 - 5.0 g/dL   AST 25 15 - 41 U/L   ALT 17 14 - 54 U/L   Alkaline Phosphatase 108 38 - 126 U/L   Total Bilirubin 0.7 0.3 -  1.2 mg/dL   GFR calc non Af Amer 25  (L) >60 mL/min   GFR calc Af Amer 29 (L) >60 mL/min   Anion gap 9 5 - 15  Urinalysis, Complete w Microscopic  Result Value Ref Range   Color, Urine YELLOW (A) YELLOW   APPearance CLOUDY (A) CLEAR   Specific Gravity, Urine 1.028 1.005 - 1.030   pH 5.0 5.0 - 8.0   Glucose, UA NEGATIVE NEGATIVE mg/dL   Hgb urine dipstick NEGATIVE NEGATIVE   Bilirubin Urine NEGATIVE NEGATIVE   Ketones, ur NEGATIVE NEGATIVE mg/dL   Protein, ur 30 (A) NEGATIVE mg/dL   Nitrite NEGATIVE NEGATIVE   Leukocytes, UA NEGATIVE NEGATIVE   RBC / HPF 6-30 0 - 5 RBC/hpf   WBC, UA 0-5 0 - 5 WBC/hpf   Bacteria, UA NONE SEEN NONE SEEN   Squamous Epithelial / LPF 0-5 (A) NONE SEEN   Mucous PRESENT   Troponin I  Result Value Ref Range   Troponin I <0.03 <0.03 ng/mL      Assessment & Plan:   Problem List Items Addressed This Visit      Cardiovascular and Mediastinum   Paroxysmal atrial fibrillation (HCC)    On Xarelto      Relevant Medications   losartan (COZAAR) 25 MG tablet   Hypertension    On ARB, seeing nephrologist      Relevant Medications   losartan (COZAAR) 25 MG tablet     Digestive   Barrett's esophagus    On protonix        Nervous and Auditory   Alzheimer's dementia with behavioral disturbance    Progressive worsening; in memory care unit; supportive husband; on medicine; decrease benzo, behaviors controlled        Genitourinary   CKD (chronic kidney disease) stage 3, GFR 30-59 ml/min    Encouraged more hydration, wrote in orders for assisted living; no NSAIDs; husband to f/u with nephrologist over reduced renal function; he declined me checking labs today        Other   Gout    On uloric per nephrologist          Follow up plan: Return in about 4 months (around 09/11/2017) for twenty minute follow-up with fasting labs.  An after-visit summary was printed and given to the patient at [place].  Please see the patient instructions which may contain other information and  recommendations beyond what is mentioned above in the assessment and plan.  Meds ordered this encounter  Medications  . losartan (COZAAR) 25 MG tablet    Sig: Take 25 mg by mouth as needed.    No orders of the defined types were placed in this encounter.

## 2017-05-11 NOTE — Patient Instructions (Addendum)
I will encourage more hydration Avoid NSAIDs Follow-up with kidney doctor about decreased kidney function noted in the hospital Consider other shoes and keep a close eye on the feet Let us know right away of any sores that don't heal

## 2017-05-12 ENCOUNTER — Other Ambulatory Visit: Payer: Self-pay | Admitting: *Deleted

## 2017-05-12 MED ORDER — RIVAROXABAN 20 MG PO TABS: ORAL_TABLET | ORAL | 3 refills | Status: DC

## 2017-05-16 NOTE — Assessment & Plan Note (Signed)
On ARB, seeing nephrologist

## 2017-05-16 NOTE — Assessment & Plan Note (Signed)
On Xarelto 

## 2017-05-16 NOTE — Assessment & Plan Note (Signed)
Progressive worsening; in memory care unit; supportive husband; on medicine; decrease benzo, behaviors controlled

## 2017-05-16 NOTE — Assessment & Plan Note (Signed)
Encouraged more hydration, wrote in orders for assisted living; no NSAIDs; husband to f/u with nephrologist over reduced renal function; he declined me checking labs today

## 2017-05-16 NOTE — Assessment & Plan Note (Signed)
On protonix

## 2017-05-16 NOTE — Assessment & Plan Note (Signed)
On uloric per nephrologist

## 2017-05-24 ENCOUNTER — Telehealth: Payer: Self-pay | Admitting: Family Medicine

## 2017-05-24 NOTE — Telephone Encounter (Signed)
No Physical on file for last year. I will fax over last office note. @ [phone] to [place].

## 2017-05-24 NOTE — Telephone Encounter (Signed)
Erica from [employer] would like a call back 336 [phone]

## 2017-05-26 ENCOUNTER — Other Ambulatory Visit: Payer: Self-pay

## 2017-05-26 ENCOUNTER — Emergency Department: Payer: Medicare Other

## 2017-05-26 ENCOUNTER — Emergency Department
Admission: EM | Admit: 2017-05-26 | Discharge: 2017-05-26 | Disposition: A | Discharge status: Home / Self Care | Payer: Medicare Other | Attending: Emergency Medicine | Admitting: Emergency Medicine

## 2017-05-26 ENCOUNTER — Encounter: Payer: Self-pay | Admitting: Emergency Medicine

## 2017-05-26 DIAGNOSIS — G308 Other Alzheimer's disease: Secondary | ICD-10-CM | POA: Insufficient documentation

## 2017-05-26 DIAGNOSIS — R4182 Altered mental status, unspecified: Secondary | ICD-10-CM | POA: Diagnosis not present

## 2017-05-26 DIAGNOSIS — N183 Chronic kidney disease, stage 3 (moderate): Secondary | ICD-10-CM | POA: Insufficient documentation

## 2017-05-26 DIAGNOSIS — E860 Dehydration: Secondary | ICD-10-CM

## 2017-05-26 DIAGNOSIS — Z7901 Long term (current) use of anticoagulants: Secondary | ICD-10-CM | POA: Diagnosis not present

## 2017-05-26 DIAGNOSIS — Z79899 Other long term (current) drug therapy: Secondary | ICD-10-CM | POA: Diagnosis not present

## 2017-05-26 DIAGNOSIS — I129 Hypertensive chronic kidney disease with stage 1 through stage 4 chronic kidney disease, or unspecified chronic kidney disease: Secondary | ICD-10-CM | POA: Diagnosis not present

## 2017-05-26 DIAGNOSIS — I517 Cardiomegaly: Secondary | ICD-10-CM | POA: Diagnosis not present

## 2017-05-26 LAB — CBC WITH DIFFERENTIAL/PLATELET
BASOS ABS: 0.1 10*3/uL (ref 0–0.1)
BASOS PCT: 1 %
EOS ABS: 0.1 10*3/uL (ref 0–0.7)
EOS PCT: 1 %
HCT: 42.8 % (ref 35.0–47.0)
HEMOGLOBIN: 14.2 g/dL (ref 12.0–16.0)
Lymphocytes Relative: 11 %
Lymphs Abs: 1.3 10*3/uL (ref 1.0–3.6)
MCH: 29.6 pg (ref 26.0–34.0)
MCHC: 33.2 g/dL (ref 32.0–36.0)
MCV: 89 fL (ref 80.0–100.0)
Monocytes Absolute: 0.3 10*3/uL (ref 0.2–0.9)
Monocytes Relative: 2 %
Neutro Abs: 10.1 10*3/uL — ABNORMAL HIGH (ref 1.4–6.5)
Neutrophils Relative %: 85 %
PLATELETS: 330 10*3/uL (ref 150–440)
RBC: 4.8 MIL/uL (ref 3.80–5.20)
RDW: 14.7 % — ABNORMAL HIGH (ref 11.5–14.5)
WBC: 11.8 10*3/uL — AB (ref 3.6–11.0)

## 2017-05-26 LAB — COMPREHENSIVE METABOLIC PANEL
ALT: 14 U/L (ref 14–54)
AST: 22 U/L (ref 15–41)
Albumin: 3.7 g/dL (ref 3.5–5.0)
Alkaline Phosphatase: 113 U/L (ref 38–126)
Anion gap: 8 (ref 5–15)
BUN: 19 mg/dL (ref 6–20)
CO2: 26 mmol/L (ref 22–32)
Calcium: 9.2 mg/dL (ref 8.9–10.3)
Chloride: 106 mmol/L (ref 101–111)
Creatinine, Ser: 1.31 mg/dL — ABNORMAL HIGH (ref 0.44–1.00)
GFR calc Af Amer: 44 mL/min — ABNORMAL LOW (ref >60)
GFR calc non Af Amer: 38 mL/min — ABNORMAL LOW (ref >60)
Glucose, Bld: 187 mg/dL — ABNORMAL HIGH (ref 65–99)
Potassium: 3.9 mmol/L (ref 3.5–5.1)
Sodium: 140 mmol/L (ref 135–145)
Total Bilirubin: 1 mg/dL (ref 0.3–1.2)
Total Protein: 6.7 g/dL (ref 6.5–8.1)

## 2017-05-26 LAB — TROPONIN I: Troponin I: <0.03 ng/mL (ref <0.03)

## 2017-05-26 LAB — URINALYSIS, COMPLETE (UACMP) WITH MICROSCOPIC
BACTERIA UA: NONE SEEN
Bilirubin Urine: NEGATIVE
Glucose, UA: NEGATIVE mg/dL
HGB URINE DIPSTICK: NEGATIVE
Ketones, ur: NEGATIVE mg/dL
Leukocytes, UA: NEGATIVE
NITRITE: NEGATIVE
PROTEIN: NEGATIVE mg/dL
SPECIFIC GRAVITY, URINE: 1.021 (ref 1.005–1.030)
pH: 5 (ref 5.0–8.0)

## 2017-05-26 LAB — PROTIME-INR
INR: 1.46
PROTHROMBIN TIME: 17.9 s — AB (ref 11.4–15.2)

## 2017-05-26 MED ORDER — SODIUM CHLORIDE 0.9 % IV BOLUS (SEPSIS)
500.0000 mL | Freq: Once | INTRAVENOUS | Status: AC
  Administered 2017-05-26: 500 mL via INTRAVENOUS

## 2017-05-26 NOTE — ED Notes (Signed)
Pt husband went to car before e signature collected. Pt unable to sign due to dementia.

## 2017-05-26 NOTE — ED Provider Notes (Addendum)
[facility] Emergency Department Provider Note  ____________________________________________   I have reviewed the triage vital signs and the nursing notes.   HISTORY  Chief Complaint Altered Mental Status  Level 5 chart caveat; no further history available due to patient status.  HPI Jennifer Klein is a 76 y.o. female who presents today complaining of nothing. She has significant Alzheimer's. She states she has no complaints and she feels fine. However, she was sent in by the nursing home. According to EMS,she is sitting in a chair and was slow to respond but then responded and now is appropriate. No seizure activity was noted, no fall She has no recollection of this. Blood glucose and vital signs are reassuring for EMS and the way in. No history of trauma.     Past Medical History:  Diagnosis Date  . Alzheimer's dementia with behavioral disturbance 05/04/2016  . Alzheimer's dementia without behavioral disturbance 05/04/2016  . B12 deficiency 05/06/2016  . Gout   . Hemorrhoids   . Hyperlipidemia LDL goal <100 05/05/2016  . Hypertension   . Memory loss   . PAF (paroxysmal atrial fibrillation) ([place])    a. on Xarelto; b. 48-hr Holter 2014: NSR with PAF/flutter which happened at least 2.26% of the time w/ associated tachycardia. Patient was asymptomatic; c. CHADS2VASc => 4 (HTN, age x 2, female)  . Paroxysmal atrial fibrillation ([place]) 08/07/2013  . Senile osteoporosis   . Systolic dysfunction    a. echo 08/2013: EF 50-55%, mild MR, mildly dilated LA    Patient Active Problem List   Diagnosis Date Noted  . CKD (chronic kidney disease) stage 3, GFR 30-59 ml/min 09/04/2016  . B12 deficiency 05/06/2016  . Hyperlipidemia LDL goal <100 05/05/2016  . Alzheimer's dementia with behavioral disturbance 05/04/2016  . Medication monitoring encounter 05/04/2016  . Barrett's esophagus 11/09/2015  . Paroxysmal atrial fibrillation ([place]) 08/07/2013  . Osteopenia 08/08/2012   . Hypertension 06/03/2012  . Gout 06/03/2012    Past Surgical History:  Procedure Laterality Date  . TUBAL LIGATION      Prior to Admission medications   Medication Sig Start Date End Date Taking? Authorizing Provider  atorvastatin (LIPITOR) 20 MG tablet TAKE 1 TABLET BY MOUTH AT BEDTIME 04/09/17   Lada, Satira Anis, MD  Cholecalciferol (VITAMIN D3) 2000 units TABS Take 4,000 Units by mouth daily.    [provider]  donepezil (ARICEPT) 10 MG tablet TAKE 1 TABLET BY MOUTH AT BEDTIME. Patient taking differently: TAKE 1 TABLET BY MOUTH DAILY. 06/30/16   Arnetha Courser, MD  loratadine (CLARITIN) 10 MG tablet Take 10 mg by mouth daily.    [provider]  LORazepam (ATIVAN) 0.5 MG tablet One by mouth every morning at 0800 hours, and one-HALF of a pill by mouth every evening at 2000 hours 05/10/17   Lada, Satira Anis, MD  losartan (COZAAR) 25 MG tablet Take 25 mg by mouth as needed.    [provider]  memantine (NAMENDA) 10 MG tablet TAKE 1 TABLET BY MOUTH TWICE DAILY 12/23/16   Lada, Satira Anis, MD  metoprolol tartrate (LOPRESSOR) 25 MG tablet Take 1 tablet (25 mg total) by mouth 2 (two) times daily. 04/06/17 07/05/17  Wellington Hampshire, MD  pantoprazole (PROTONIX) 40 MG tablet Take 1 tablet (40 mg total) by mouth daily. This replaces Zantac (ranitidine) 01/31/17   Lada, Satira Anis, MD  rivaroxaban (XARELTO) 20 MG TABS tablet TAKE 1 TABLET BY MOUTH ONCE DAILY WITH SUPPER 05/12/17   Arida,  Mertie Clause, MD  ULORIC 40 MG tablet TAKE 1 TABLET BY MOUTH ONCE DAILY 10/21/16   Lada, Satira Anis, MD    Allergies Darvon [propoxyphene hcl]  Family History  Problem Relation Age of Onset  . Heart disease Father   . Gout Brother     Social History Social History  Substance Use Topics  . Smoking status: Former Smoker    Packs/day: 0.25    Years: 1.00    Types: Cigarettes  . Smokeless tobacco: Never Used  . Alcohol use 0.0 oz/week     Comment: occassionally    Review of  Systems Level 5 chart caveat; no further history available due to patient status.   ____________________________________________   PHYSICAL EXAM:  VITAL SIGNS: ED Triage Vitals  Enc Vitals Group     BP      Pulse      Resp      Temp      Temp src      SpO2      Weight      Height      Head Circumference      Peak Flow      Pain Score      Pain Loc      Pain Edu?      Excl. in [place]?     Constitutional: Alert and orientedTo name unsure of the place, I can tell me the name of her dog when she was young, at her baseline per EMS Well appearing and in no acute distress. Eyes: Conjunctivae are normal Head: Atraumatic HEENT: No congestion/rhinnorhea. Mucous membranes are moist.  Oropharynx non-erythematous Neck:   Nontender with no meningismus, no masses, no stridor Cardiovascular: Normal rate, regular rhythm. Grossly normal heart sounds.  Good peripheral circulation. Respiratory: Normal respiratory effort.  No retractions. Lungs CTAB. Abdominal: Soft and nontender. No distention. No guarding no rebound Back:  There is no focal tenderness or step off.  there is no midline tenderness there are no lesions noted. there is no CVA tenderness Musculoskeletal: No lower extremity tenderness, no upper extremity tenderness. No joint effusions, no DVT signs strong distal pulses no edema Neurologic:  Normal speech and language. No gross focal neurologic deficits are appreciated.  Skin:  Skin is warm, dry and intact. No rash noted. Psychiatric: Mood and affect are flat. Speech and behavior are normal.  ____________________________________________   LABS (all labs ordered are listed, but only abnormal results are displayed)  Labs Reviewed  CBC WITH DIFFERENTIAL/PLATELET  TROPONIN I  COMPREHENSIVE METABOLIC PANEL  URINALYSIS, COMPLETE (UACMP) WITH MICROSCOPIC  PROTIME-INR   ____________________________________________  EKG  I personally interpreted any EKGs ordered by me or  triage Atrial fibrillation, rate controlled, 79 bpm normal axis no acute ischemic changes right bundle branch block noted ____________________________________________  RADIOLOGY  I reviewed any imaging ordered by me or triage that were performed during my shift and, if possible, patient and/or family made aware of any abnormal findings. ____________________________________________   PROCEDURES  Procedure(s) performed: None  Procedures  Critical Care performed: None  ____________________________________________   INITIAL IMPRESSION / ASSESSMENT AND PLAN / ED COURSE  Pertinent labs & imaging results that were available during my care of the patient were reviewed by me and considered in my medical decision making (see chart for details).  Patient apparently was slow to respond for a while now is normally responsive. Initial exam is quite reassuring. She has no complaints at this time.  ----------------------------------------- 1:22 PM on 05/26/2017 -----------------------------------------  Husband is here, he states this is something that regularly happens to the patient she is at her baseline he feels he might be somewhat dehydrated. She herself continues to have no complaints. Workup is reassuring. Husband for not to have her admitted and I don't think that is unreasonable. There is no acute pathology noted. We will discharge her home after IV fluids. Term precautions and follow-up given and understood. Patient and family are very comfortable with this plan. Creatinine is slightly above baseline, patient has a trouble with drinking and sometimes when she gets dehydrated she gets less responsive she certainly has been very awake and alert and responsive the entire time she is here.   ____________________________________________   FINAL CLINICAL IMPRESSION(S) / ED DIAGNOSES  Final diagnoses:  None      This chart was dictated using voice recognition software.  Despite best  efforts to proofread,  errors can occur which can change meaning.      Schuyler Amor, MD 05/26/17 0802    Schuyler Amor, MD 05/26/17 [id number]    Schuyler Amor, MD 05/26/17 1322

## 2017-05-26 NOTE — ED Triage Notes (Signed)
Pt via ems from [place] [facility]; found unresponsive in chair. Sternal rub awakened her. Pt lethargic, cool, & clammy per ems. Pt has baseline dementia. States her leg hurts but she doesn't know which one. Pt oriented to self but doesn't know her birth date or where she is. Pt alert.  NAD noted.

## 2017-05-26 NOTE — ED Notes (Signed)
This RN and Tammy, ED Medic attempted to get pt up to toilet to receive urine specimen, pt ambulates well with steady gait but is too disoriented at baseline to be able to attempt to urinate on command.  Will in/out cath pt to receive specimen.

## 2017-05-26 NOTE — ED Notes (Signed)
Report back to [facility], [place]. Pt to be taken back by husband.

## 2017-05-28 ENCOUNTER — Ambulatory Visit: Payer: Medicare Other | Admitting: Family Medicine

## 2017-06-02 ENCOUNTER — Ambulatory Visit (INDEPENDENT_AMBULATORY_CARE_PROVIDER_SITE_OTHER): Payer: Medicare Other | Admitting: Family Medicine

## 2017-06-02 ENCOUNTER — Encounter: Payer: Self-pay | Admitting: Family Medicine

## 2017-06-02 VITALS — BP 102/80 | HR 64 | Temp 97.7°F | Resp 16 | Wt 153.6 lb

## 2017-06-02 DIAGNOSIS — I999 Unspecified disorder of circulatory system: Secondary | ICD-10-CM

## 2017-06-02 DIAGNOSIS — I480 Paroxysmal atrial fibrillation: Secondary | ICD-10-CM | POA: Diagnosis not present

## 2017-06-02 DIAGNOSIS — G309 Alzheimer's disease, unspecified: Secondary | ICD-10-CM

## 2017-06-02 DIAGNOSIS — I10 Essential (primary) hypertension: Secondary | ICD-10-CM | POA: Diagnosis not present

## 2017-06-02 DIAGNOSIS — F0281 Dementia in other diseases classified elsewhere with behavioral disturbance: Secondary | ICD-10-CM

## 2017-06-02 DIAGNOSIS — K2270 Barrett's esophagus without dysplasia: Secondary | ICD-10-CM | POA: Diagnosis not present

## 2017-06-02 DIAGNOSIS — E860 Dehydration: Secondary | ICD-10-CM | POA: Diagnosis not present

## 2017-06-02 MED ORDER — LORAZEPAM 0.5 MG PO TABS: 0.2500 mg | ORAL_TABLET | Freq: Two times a day (BID) | ORAL | 0 refills | Status: DC | PRN

## 2017-06-02 NOTE — Assessment & Plan Note (Signed)
Continue protonix  

## 2017-06-02 NOTE — Progress Notes (Signed)
BP 102/80   Pulse 64   Temp 97.7 F (36.5 C) (Oral)   Resp 16   Wt 153 lb 9.6 oz (69.7 kg)   SpO2 92%   BMI 26.37 kg/m    Subjective:    Patient ID: Jennifer Klein, female    DOB: April 15, 1941, 76 y.o.   MRN: [id number]  HPI: Jennifer Klein is a 76 y.o. female  Chief Complaint  Patient presents with  . Hospitalization Follow-up    syncope and dehydration   HPI She resides at memory care unit of [facility] Was less responsive and they sent her over to the ER No complaints from the patient  Per ER HPI: Jennifer Klein is a 76 y.o. female who presents today complaining of nothing. She has significant Alzheimer's. She states she has no complaints and she feels fine. However, she was sent in by the nursing home. According to EMS,she is sitting in a chair and was slow to respond but then responded and now is appropriate. No seizure activity was noted, no fall She has no recollection of this. Blood glucose and vital signs are reassuring for EMS and the way in. No history of trauma. --------------------------------------------  They are trying to get liquids up at the nursing home Drinking more fluids Urine showed SG of 1.021 Husband asked about depression; he sees her smile and she laughs sometimes  She denies being sad Recently treated for lice; they treated her at the nursing home and no problems after that  Depression screen [facility] 2/9 06/02/2017 05/11/2017 01/19/2017 09/04/2016 05/04/2016  Decreased Interest 0 0 0 0 0  Down, Depressed, Hopeless 0 0 0 0 0  PHQ - 2 Score 0 0 0 0 0  Altered sleeping - - - - -  Tired, decreased energy - - - - -  Change in appetite - - - - -  Feeling bad or failure about yourself  - - - - -  Trouble concentrating - - - - -  Moving slowly or fidgety/restless - - - - -  Suicidal thoughts - - - - -  PHQ-9 Score - - - - -   Relevant past medical, surgical, family and social history reviewed Past Medical History:  Diagnosis Date  . Alzheimer's  dementia with behavioral disturbance 05/04/2016  . Alzheimer's dementia without behavioral disturbance 05/04/2016  . B12 deficiency 05/06/2016  . Gout   . Hemorrhoids   . Hyperlipidemia LDL goal <100 05/05/2016  . Hypertension   . Memory loss   . PAF (paroxysmal atrial fibrillation) ([place])    a. on Xarelto; b. 48-hr Holter 2014: NSR with PAF/flutter which happened at least 2.26% of the time w/ associated tachycardia. Patient was asymptomatic; c. CHADS2VASc => 4 (HTN, age x 2, female)  . Paroxysmal atrial fibrillation ([place]) 08/07/2013  . Senile osteoporosis   . Systolic dysfunction    a. echo 08/2013: EF 50-55%, mild MR, mildly dilated LA   Past Surgical History:  Procedure Laterality Date  . TUBAL LIGATION     Family History  Problem Relation Age of Onset  . Heart disease Father   . Gout Brother    Social History   Social History  . Marital status: Married    Spouse name: N/A  . Number of children: N/A  . Years of education: N/A   Occupational History  . Not on file.   Social History Main Topics  . Smoking status: Former Smoker    Packs/day: 0.25  Years: 1.00    Types: Cigarettes  . Smokeless tobacco: Never Used  . Alcohol use 0.0 oz/week     Comment: occassionally  . Drug use: No  . Sexual activity: Not on file   Other Topics Concern  . Not on file   Social History Narrative   Lives with husband in [place].   Worked at [employer] and [employer].      Diet - regular        MD note: she does not live with husband any longer; she lives in a memory care unit  Interim medical history since last visit reviewed. Allergies and medications reviewed  Review of Systems Per HPI unless specifically indicated above     Objective:    BP 102/80   Pulse 64   Temp 97.7 F (36.5 C) (Oral)   Resp 16   Wt 153 lb 9.6 oz (69.7 kg)   SpO2 92%   BMI 26.37 kg/m   Wt Readings from Last 3 Encounters:  06/04/17 153 lb (69.4 kg)  06/02/17 153 lb 9.6 oz (69.7 kg)  05/26/17 156 lb  (70.8 kg)    Physical Exam  Constitutional: She appears well-developed and well-nourished.  Weight loss less than 3 pounds over last month  HENT:  Mouth/Throat: Mucous membranes are normal.  Eyes: No scleral icterus.  Neck: No thyromegaly present.  Cardiovascular: Normal rate.  An irregularly irregular rhythm present.  Pulmonary/Chest: Effort normal and breath sounds normal.  Abdominal: She exhibits no distension.  Musculoskeletal: She exhibits no edema (trace lower extremity and pedal edema).  Neurological: She is alert. She displays no tremor.  Limited historian; forgetful  Skin: She is not diaphoretic. No pallor.  Blistered areas from last visit are scabbed; new shoes; distal feet/toes are cold and purplish  Psychiatric: Her mood appears not anxious. Her speech is not rapid and/or pressured. She is not agitated and not aggressive. Cognition and memory are impaired. She does not express impulsivity. She does not exhibit a depressed mood. She exhibits abnormal recent memory.  Poor historian; asked her husband what her name was during our visit She is attentive.    Results for orders placed or performed during the hospital encounter of 05/26/17  CBC with Differential  Result Value Ref Range   WBC 11.8 (H) 3.6 - 11.0 K/uL   RBC 4.80 3.80 - 5.20 MIL/uL   Hemoglobin 14.2 12.0 - 16.0 g/dL   HCT 42.8 35.0 - 47.0 %   MCV 89.0 80.0 - 100.0 fL   MCH 29.6 26.0 - 34.0 pg   MCHC 33.2 32.0 - 36.0 g/dL   RDW 14.7 (H) 11.5 - 14.5 %   Platelets 330 150 - 440 K/uL   Neutrophils Relative % 85 %   Neutro Abs 10.1 (H) 1.4 - 6.5 K/uL   Lymphocytes Relative 11 %   Lymphs Abs 1.3 1.0 - 3.6 K/uL   Monocytes Relative 2 %   Monocytes Absolute 0.3 0.2 - 0.9 K/uL   Eosinophils Relative 1 %   Eosinophils Absolute 0.1 0 - 0.7 K/uL   Basophils Relative 1 %   Basophils Absolute 0.1 0 - 0.1 K/uL  Troponin I  Result Value Ref Range   Troponin I <0.03 <0.03 ng/mL  Comprehensive metabolic panel  Result  Value Ref Range   Sodium 140 135 - 145 mmol/L   Potassium 3.9 3.5 - 5.1 mmol/L   Chloride 106 101 - 111 mmol/L   CO2 26 22 - 32 mmol/L   Glucose,  Bld 187 (H) 65 - 99 mg/dL   BUN 19 6 - 20 mg/dL   Creatinine, Ser 1.31 (H) 0.44 - 1.00 mg/dL   Calcium 9.2 8.9 - 10.3 mg/dL   Total Protein 6.7 6.5 - 8.1 g/dL   Albumin 3.7 3.5 - 5.0 g/dL   AST 22 15 - 41 U/L   ALT 14 14 - 54 U/L   Alkaline Phosphatase 113 38 - 126 U/L   Total Bilirubin 1.0 0.3 - 1.2 mg/dL   GFR calc non Af Amer 38 (L) >60 mL/min   GFR calc Af Amer 44 (L) >60 mL/min   Anion gap 8 5 - 15  Urinalysis, Complete w Microscopic  Result Value Ref Range   Color, Urine YELLOW (A) YELLOW   APPearance CLEAR (A) CLEAR   Specific Gravity, Urine 1.021 1.005 - 1.030   pH 5.0 5.0 - 8.0   Glucose, UA NEGATIVE NEGATIVE mg/dL   Hgb urine dipstick NEGATIVE NEGATIVE   Bilirubin Urine NEGATIVE NEGATIVE   Ketones, ur NEGATIVE NEGATIVE mg/dL   Protein, ur NEGATIVE NEGATIVE mg/dL   Nitrite NEGATIVE NEGATIVE   Leukocytes, UA NEGATIVE NEGATIVE   RBC / HPF 0-5 0 - 5 RBC/hpf   WBC, UA 0-5 0 - 5 WBC/hpf   Bacteria, UA NONE SEEN NONE SEEN   Squamous Epithelial / LPF 0-5 (A) NONE SEEN  Protime-INR  Result Value Ref Range   Prothrombin Time 17.9 (H) 11.4 - 15.2 seconds   INR 1.46       Assessment & Plan:   Problem List Items Addressed This Visit      Cardiovascular and Mediastinum   Paroxysmal atrial fibrillation (HCC)    Rate-controlled; on xarelto and beta-blocker      Hypertension    Patient is on agents for renal protection and rate control; I am not comfortable adding another agent for possible Raynaud's versus PVD given how low her BP already is        Digestive   Barrett's esophagus    Continue protonix        Nervous and Auditory   Alzheimer's dementia with behavioral disturbance    Stop antihistamine; no behavioral problems, so will further decrease the lorazepam      Relevant Medications   LORazepam (ATIVAN) 0.5  MG tablet    Other Visit Diagnoses    Dehydration    -  Primary   encouraged adequate hydration at the assisted living facility; stop antihistamine   Circulation disorder of lower extremity       Relevant Orders   Ambulatory referral to Vascular Surgery       Follow up plan: No Follow-up on file.  An after-visit summary was printed and given to the patient at [facility].  Please see the patient instructions which may contain other information and recommendations beyond what is mentioned above in the assessment and plan.  Meds ordered this encounter  Medications  . LORazepam (ATIVAN) 0.5 MG tablet    Sig: Take 0.5 tablets (0.25 mg total) by mouth 2 (two) times daily as needed for anxiety. (new instructions)    Dispense:  30 tablet    Refill:  0    Orders Placed This Encounter  Procedures  . Ambulatory referral to Vascular Surgery   Medications Discontinued During This Encounter  Medication Reason  . loratadine (CLARITIN) 10 MG tablet Discontinued by provider  . LORazepam (ATIVAN) 0.5 MG tablet Dose change

## 2017-06-02 NOTE — Assessment & Plan Note (Signed)
Rate-controlled; on xarelto and beta-blocker

## 2017-06-02 NOTE — Patient Instructions (Signed)
We'll have her see a vascular doctor We'll stop the allergy medicine We'll decrease the anxiety medicine Hydrate

## 2017-06-04 ENCOUNTER — Emergency Department: Payer: Medicare Other

## 2017-06-04 ENCOUNTER — Telehealth: Payer: Self-pay | Admitting: Family Medicine

## 2017-06-04 ENCOUNTER — Emergency Department
Admission: EM | Admit: 2017-06-04 | Discharge: 2017-06-04 | Disposition: A | Discharge status: Home / Self Care | Payer: Medicare Other | Attending: Emergency Medicine | Admitting: Emergency Medicine

## 2017-06-04 DIAGNOSIS — R05 Cough: Secondary | ICD-10-CM | POA: Diagnosis not present

## 2017-06-04 DIAGNOSIS — R4182 Altered mental status, unspecified: Secondary | ICD-10-CM | POA: Insufficient documentation

## 2017-06-04 DIAGNOSIS — R55 Syncope and collapse: Secondary | ICD-10-CM | POA: Insufficient documentation

## 2017-06-04 DIAGNOSIS — I129 Hypertensive chronic kidney disease with stage 1 through stage 4 chronic kidney disease, or unspecified chronic kidney disease: Secondary | ICD-10-CM | POA: Diagnosis not present

## 2017-06-04 DIAGNOSIS — R531 Weakness: Secondary | ICD-10-CM

## 2017-06-04 DIAGNOSIS — N183 Chronic kidney disease, stage 3 (moderate): Secondary | ICD-10-CM | POA: Diagnosis not present

## 2017-06-04 DIAGNOSIS — Z87891 Personal history of nicotine dependence: Secondary | ICD-10-CM | POA: Diagnosis not present

## 2017-06-04 DIAGNOSIS — G308 Other Alzheimer's disease: Secondary | ICD-10-CM | POA: Insufficient documentation

## 2017-06-04 DIAGNOSIS — S0990XA Unspecified injury of head, initial encounter: Secondary | ICD-10-CM | POA: Diagnosis not present

## 2017-06-04 LAB — COMPREHENSIVE METABOLIC PANEL
ALBUMIN: 3.5 g/dL (ref 3.5–5.0)
ALK PHOS: 99 U/L (ref 38–126)
ALT: 14 U/L (ref 14–54)
AST: 25 U/L (ref 15–41)
Anion gap: 8 (ref 5–15)
BUN: 19 mg/dL (ref 6–20)
CALCIUM: 8.5 mg/dL — AB (ref 8.9–10.3)
CHLORIDE: 104 mmol/L (ref 101–111)
CO2: 26 mmol/L (ref 22–32)
Creatinine, Ser: 1.12 mg/dL — ABNORMAL HIGH (ref 0.44–1.00)
GFR calc Af Amer: 53 mL/min — ABNORMAL LOW (ref >60)
GFR calc non Af Amer: 46 mL/min — ABNORMAL LOW (ref >60)
GLUCOSE: 136 mg/dL — AB (ref 65–99)
Potassium: 3.6 mmol/L (ref 3.5–5.1)
SODIUM: 138 mmol/L (ref 135–145)
Total Bilirubin: 1 mg/dL (ref 0.3–1.2)
Total Protein: 6.6 g/dL (ref 6.5–8.1)

## 2017-06-04 LAB — CBC WITH DIFFERENTIAL/PLATELET
BASOS PCT: 0 %
Basophils Absolute: 0 10*3/uL (ref 0–0.1)
Eosinophils Absolute: 0.1 10*3/uL (ref 0–0.7)
Eosinophils Relative: 2 %
HCT: 40.2 % (ref 35.0–47.0)
HEMOGLOBIN: 13.9 g/dL (ref 12.0–16.0)
Lymphocytes Relative: 20 %
Lymphs Abs: 1.3 10*3/uL (ref 1.0–3.6)
MCH: 30.6 pg (ref 26.0–34.0)
MCHC: 34.6 g/dL (ref 32.0–36.0)
MCV: 88.4 fL (ref 80.0–100.0)
MONOS PCT: 9 %
Monocytes Absolute: 0.6 10*3/uL (ref 0.2–0.9)
NEUTROS PCT: 69 %
Neutro Abs: 4.6 10*3/uL (ref 1.4–6.5)
PLATELETS: 248 10*3/uL (ref 150–440)
RBC: 4.54 MIL/uL (ref 3.80–5.20)
RDW: 14.6 % — ABNORMAL HIGH (ref 11.5–14.5)
WBC: 6.6 10*3/uL (ref 3.6–11.0)

## 2017-06-04 LAB — TROPONIN I: Troponin I: <0.03 ng/mL (ref <0.03)

## 2017-06-04 NOTE — ED Triage Notes (Signed)
Pt came to Ed via EMS from [facility]. Pt fell while eating breakfast. Has been having some syncopal episodes per staff along with increased weakness. VS stable.

## 2017-06-04 NOTE — ED Notes (Signed)
Talked to spouse and [facility]. Aware of discharge instructions.

## 2017-06-04 NOTE — Telephone Encounter (Signed)
Patient contacted with health care facility

## 2017-06-04 NOTE — ED Notes (Signed)
Pt is difficult to obtain urine sample. 3 RNs attempted catheter, very small sample received. Lab says we can run urine culture with the amount. MD notified.

## 2017-06-04 NOTE — ED Notes (Signed)
[phone] [name])

## 2017-06-04 NOTE — ED Provider Notes (Signed)
[facility] Emergency Department Provider Note       Time seen: ----------------------------------------- 8:41 AM on 06/04/2017 -----------------------------------------  Level V caveat: History/ROS limited by altered mental status   I have reviewed the triage vital signs and the nursing notes.   HISTORY   Chief Complaint Fall and Weakness    HPI Jennifer Klein is a 76 y.o. female who presents to the ED for altered mental status. Patient possibly had a syncopal event as well. Nursing home staff reports abnormal behavioral for her. She has been more confused than normal. They deny any recent history, she presents to the ER coughing and confused   Past Medical History:  Diagnosis Date  . Alzheimer's dementia with behavioral disturbance 05/04/2016  . Alzheimer's dementia without behavioral disturbance 05/04/2016  . B12 deficiency 05/06/2016  . Gout   . Hemorrhoids   . Hyperlipidemia LDL goal <100 05/05/2016  . Hypertension   . Memory loss   . PAF (paroxysmal atrial fibrillation) ([place])    a. on Xarelto; b. 48-hr Holter 2014: NSR with PAF/flutter which happened at least 2.26% of the time w/ associated tachycardia. Patient was asymptomatic; c. CHADS2VASc => 4 (HTN, age x 2, female)  . Paroxysmal atrial fibrillation ([place]) 08/07/2013  . Senile osteoporosis   . Systolic dysfunction    a. echo 08/2013: EF 50-55%, mild MR, mildly dilated LA    Patient Active Problem List   Diagnosis Date Noted  . CKD (chronic kidney disease) stage 3, GFR 30-59 ml/min 09/04/2016  . B12 deficiency 05/06/2016  . Hyperlipidemia LDL goal <100 05/05/2016  . Alzheimer's dementia with behavioral disturbance 05/04/2016  . Medication monitoring encounter 05/04/2016  . Barrett's esophagus 11/09/2015  . Paroxysmal atrial fibrillation ([place]) 08/07/2013  . Osteopenia 08/08/2012  . Hypertension 06/03/2012  . Gout 06/03/2012    Past Surgical History:  Procedure Laterality Date  .  TUBAL LIGATION      Allergies Darvon [propoxyphene hcl]  Social History Social History  Substance Use Topics  . Smoking status: Former Smoker    Packs/day: 0.25    Years: 1.00    Types: Cigarettes  . Smokeless tobacco: Never Used  . Alcohol use 0.0 oz/week     Comment: occassionally    Review of Systems Unknown, possible for syncope, cough  All systems negative/normal/unremarkable except as stated in the HPI  ____________________________________________   PHYSICAL EXAM:  VITAL SIGNS: ED Triage Vitals  Enc Vitals Group     BP      Pulse      Resp      Temp      Temp src      SpO2      Weight      Height      Head Circumference      Peak Flow      Pain Score      Pain Loc      Pain Edu?      Excl. in [place]?     Constitutional: Alert But disoriented, Well appearing and in no distress. Eyes: Conjunctivae are normal. Normal extraocular movements. ENT   Head: Normocephalic and atraumatic.   Nose: No congestion/rhinnorhea.   Mouth/Throat: Mucous membranes are moist.   Neck: No stridor. Cardiovascular: Normal rate, regular rhythm. No murmurs, rubs, or gallops. Respiratory: Normal respiratory effort without tachypnea nor retractions. Scattered rhonchi are noted Gastrointestinal: Soft and nontender. Normal bowel sounds Musculoskeletal: Nontender with normal range of motion in extremities. No lower extremity tenderness nor edema.  Neurologic:  Normal speech and language. No gross focal neurologic deficits are appreciated.  Skin:  Skin is warm, dry and intact. No rash noted. Psychiatric: Mood and affect are normal.  ____________________________________________  EKG: Interpreted by me.Patient for ablation with a rate of 88 bpm, wide QRS, long QT, right bundle branch block.  ____________________________________________  ED COURSE:  Pertinent labs & imaging results that were available during my care of the patient were reviewed by me and considered in my  medical decision making (see chart for details). Patient presents for altered mental status and possible syncope, we will assess with labs and imaging as indicated.   Procedures ____________________________________________   LABS (pertinent positives/negatives)  Labs Reviewed  CBC WITH DIFFERENTIAL/PLATELET - Abnormal; Notable for the following:       Result Value   RDW 14.6 (*)    All other components within normal limits  COMPREHENSIVE METABOLIC PANEL - Abnormal; Notable for the following:    Glucose, Bld 136 (*)    Creatinine, Ser 1.12 (*)    Calcium 8.5 (*)    GFR calc non Af Amer 46 (*)    GFR calc Af Amer 53 (*)    All other components within normal limits  URINE CULTURE  TROPONIN I  URINALYSIS, COMPLETE (UACMP) WITH MICROSCOPIC    RADIOLOGY  CT head, chest x-ray  IMPRESSION: No acute abnormality noted In the head or chest  ____________________________________________  FINAL ASSESSMENT AND PLAN  Altered mental status  Plan: Patient's labs and imaging were dictated above. Patient had presented for altered mental status of uncertain etiology. This is likely secondary to dementia. Labs were reassuring as they were 10 days ago. She is stable for outpatient follow-up.   [name], MD   Note: This note was generated in part or whole with voice recognition software. Voice recognition is usually quite accurate but there are transcription errors that can and very often do occur. I apologize for any typographical errors that were not detected and corrected.     [name], MD 06/04/17 1058

## 2017-06-04 NOTE — ED Notes (Signed)
MD spoke to pts spouse, will pick pt up within the hour.

## 2017-06-04 NOTE — Telephone Encounter (Signed)
Jennifer Klein from [facility] pt has been sent to ER due to patient falling, [facility] think that patient may have blacked out at the table. The hospital is D/C her losartan and they are not sure if this is what you are wanting for them to do. This morning [facility] checked her O2 levels and it was 69. Please return there call [phone]

## 2017-06-05 LAB — URINE CULTURE

## 2017-06-07 NOTE — Assessment & Plan Note (Addendum)
Stop antihistamine; no behavioral problems, so will further decrease the lorazepam

## 2017-06-07 NOTE — Assessment & Plan Note (Signed)
Patient is on agents for renal protection and rate control; I am not comfortable adding another agent for possible Raynaud's versus PVD given how low her BP already is

## 2017-06-21 ENCOUNTER — Ambulatory Visit (INDEPENDENT_AMBULATORY_CARE_PROVIDER_SITE_OTHER): Payer: Medicare Other | Admitting: Vascular Surgery

## 2017-06-21 ENCOUNTER — Encounter (INDEPENDENT_AMBULATORY_CARE_PROVIDER_SITE_OTHER): Payer: Self-pay | Admitting: Vascular Surgery

## 2017-06-21 VITALS — BP 135/100 | HR 97 | Resp 16 | Ht 62.0 in | Wt 149.8 lb

## 2017-06-21 DIAGNOSIS — I739 Peripheral vascular disease, unspecified: Secondary | ICD-10-CM | POA: Diagnosis not present

## 2017-06-21 DIAGNOSIS — E785 Hyperlipidemia, unspecified: Secondary | ICD-10-CM

## 2017-06-21 DIAGNOSIS — I872 Venous insufficiency (chronic) (peripheral): Secondary | ICD-10-CM | POA: Diagnosis not present

## 2017-06-21 DIAGNOSIS — G309 Alzheimer's disease, unspecified: Secondary | ICD-10-CM | POA: Diagnosis not present

## 2017-06-21 DIAGNOSIS — I10 Essential (primary) hypertension: Secondary | ICD-10-CM

## 2017-06-21 DIAGNOSIS — F0281 Dementia in other diseases classified elsewhere with behavioral disturbance: Secondary | ICD-10-CM | POA: Diagnosis not present

## 2017-06-21 DIAGNOSIS — M79604 Pain in right leg: Secondary | ICD-10-CM | POA: Diagnosis not present

## 2017-06-21 DIAGNOSIS — M79605 Pain in left leg: Secondary | ICD-10-CM

## 2017-06-21 DIAGNOSIS — M7989 Other specified soft tissue disorders: Secondary | ICD-10-CM | POA: Diagnosis not present

## 2017-06-22 DIAGNOSIS — I872 Venous insufficiency (chronic) (peripheral): Secondary | ICD-10-CM | POA: Insufficient documentation

## 2017-06-22 DIAGNOSIS — M7989 Other specified soft tissue disorders: Secondary | ICD-10-CM | POA: Insufficient documentation

## 2017-06-22 DIAGNOSIS — M79605 Pain in left leg: Secondary | ICD-10-CM | POA: Insufficient documentation

## 2017-06-22 DIAGNOSIS — M79604 Pain in right leg: Secondary | ICD-10-CM | POA: Insufficient documentation

## 2017-06-22 DIAGNOSIS — I739 Peripheral vascular disease, unspecified: Secondary | ICD-10-CM | POA: Insufficient documentation

## 2017-06-22 NOTE — Progress Notes (Signed)
MRN : [id number]  Jennifer Klein is a 76 y.o. (June 20, 1941) female who presents with chief complaint of  Chief Complaint  Patient presents with  . New Evaluation    poor circulation  .  History of Present Illness:The patient's husband is the source of the history as the patient has become minimally communicative. The patient is seen for evaluation of painful lower extremities. Patient notes the pain is variable and not always associated with activity.  The pain is somewhat consistent day to day occurring on most days. The patient notes the pain also occurs with standing and routinely seems worse as the day wears on. The pain has been progressive over the past several years. The patient states these symptoms are causing  a profound negative impact on quality of life and daily activities.  The patient has advanced Alzheimer's and is become quite sedentary. She sits at a table or in chair for long periods of time.  The patient denies rest pain or dangling of an extremity off the side of the bed during the night for relief. No open wounds or sores at this time. No history of DVT or phlebitis. No prior interventions or surgeries.  There is not a history of back problems and DJD of the lumbar and sacral spine.    Current Meds  Medication Sig  . atorvastatin (LIPITOR) 20 MG tablet TAKE 1 TABLET BY MOUTH AT BEDTIME  . Cholecalciferol (VITAMIN D3) 2000 units TABS Take 4,000 Units by mouth daily.  Marland Kitchen donepezil (ARICEPT) 10 MG tablet TAKE 1 TABLET BY MOUTH AT BEDTIME. (Patient taking differently: TAKE 1 TABLET BY MOUTH DAILY.)  . LORazepam (ATIVAN) 0.5 MG tablet Take 0.5 tablets (0.25 mg total) by mouth 2 (two) times daily as needed for anxiety. (new instructions)  . memantine (NAMENDA) 10 MG tablet TAKE 1 TABLET BY MOUTH TWICE DAILY  . metoprolol tartrate (LOPRESSOR) 25 MG tablet Take 1 tablet (25 mg total) by mouth 2 (two) times daily.  . pantoprazole (PROTONIX) 40 MG tablet Take 1 tablet (40  mg total) by mouth daily. This replaces Zantac (ranitidine)  . rivaroxaban (XARELTO) 20 MG TABS tablet TAKE 1 TABLET BY MOUTH ONCE DAILY WITH SUPPER  . ULORIC 40 MG tablet TAKE 1 TABLET BY MOUTH ONCE DAILY    Past Medical History:  Diagnosis Date  . Alzheimer's dementia with behavioral disturbance 05/04/2016  . Alzheimer's dementia without behavioral disturbance 05/04/2016  . B12 deficiency 05/06/2016  . Gout   . Hemorrhoids   . Hyperlipidemia LDL goal <100 05/05/2016  . Hypertension   . Memory loss   . PAF (paroxysmal atrial fibrillation) ([place])    a. on Xarelto; b. 48-hr Holter 2014: NSR with PAF/flutter which happened at least 2.26% of the time w/ associated tachycardia. Patient was asymptomatic; c. CHADS2VASc => 4 (HTN, age x 2, female)  . Paroxysmal atrial fibrillation ([place]) 08/07/2013  . Senile osteoporosis   . Systolic dysfunction    a. echo 08/2013: EF 50-55%, mild MR, mildly dilated LA    Past Surgical History:  Procedure Laterality Date  . TUBAL LIGATION      Social History Social History  Substance Use Topics  . Smoking status: Former Smoker    Packs/day: 0.25    Years: 1.00    Types: Cigarettes  . Smokeless tobacco: Never Used  . Alcohol use 0.0 oz/week     Comment: occassionally    Family History Family History  Problem Relation Age of Onset  . Heart  disease Father   . Gout Brother   No family history of bleeding/clotting disorders, porphyria or autoimmune disease   Allergies  Allergen Reactions  . Darvon [Propoxyphene Hcl]     As a child     REVIEW OF SYSTEMS (Negative unless checked)  Constitutional: [] Weight loss  [] Fever  [] Chills Cardiac: [] Chest pain   [] Chest pressure   [] Palpitations   [] Shortness of breath when laying flat   [] Shortness of breath with exertion. Vascular:  [] Pain in legs with walking   [x] Pain in legs at rest  [] History of DVT   [] Phlebitis   [x] Swelling in legs   [] Varicose veins   [] Non-healing ulcers Pulmonary:   [] Uses  home oxygen   [] Productive cough   [] Hemoptysis   [] Wheeze  [] COPD   [] Asthma Neurologic:  [] Dizziness   [] Seizures   [] History of stroke   [] History of TIA  [] Aphasia   [] Vissual changes   [] Weakness or numbness in arm   [] Weakness or numbness in leg Musculoskeletal:   [] Joint swelling   [] Joint pain   [] Low back pain Hematologic:  [] Easy bruising  [] Easy bleeding   [] Hypercoagulable state   [] Anemic Gastrointestinal:  [] Diarrhea   [] Vomiting  [] Gastroesophageal reflux/heartburn   [] Difficulty swallowing. Genitourinary:  [] Chronic kidney disease   [] Difficult urination  [] Frequent urination   [] Blood in urine Skin:  [] Rashes   [] Ulcers  Psychological:  [x] History of dementia   []  History of major depression.  Physical Examination  Vitals:   06/21/17 1452  BP: (!) 135/100  Pulse: 97  Resp: 16  Weight: 149 lb 12.8 oz (67.9 kg)  Height: 5\' 2"  (1.575 m)   Body mass index is 27.4 kg/m. Gen: WD/WN, NAD Head: [place]/AT, No temporalis wasting.  Ear/Nose/Throat: Hearing grossly intact, nares w/o erythema or drainage, poor dentition Eyes: PER, EOMI, sclera nonicteric.  Neck: Supple, no masses.  No bruit or JVD.  Pulmonary:  Good air movement, clear to auscultation bilaterally, no use of accessory muscles.  Cardiac: RRR, normal S1, S2, no Murmurs. Vascular: Both lower extremities demonstrate mild cyanosis of the forefoot and plantar surface. The feet are cool to the touch. There is 2 second capillary refill. No open wounds or sores Vessel Right Left  Radial Palpable Palpable  PT Trace Palpable Not Palpable  DP Not Palpable Trace Palpable   Gastrointestinal: soft, non-distended. No guarding/no peritoneal signs.  Musculoskeletal: M/S 5/5 throughout.  No deformity or atrophy.  Neurologic: CN 2-12 intact. Pain and light touch intact in extremities.  Symmetrical.  Speech is fluent. Motor exam as listed above. Psychiatric: Judgment intact, Mood & affect appropriate for pt's clinical  situation. Dermatologic: Cyanosis of the feet with mild venous rashes no ulcers noted.  No changes consistent with cellulitis. Lymph : No Cervical lymphadenopathy, no lichenification or skin changes of chronic lymphedema.  CBC Lab Results  Component Value Date   WBC 6.6 06/04/2017   HGB 13.9 06/04/2017   HCT 40.2 06/04/2017   MCV 88.4 06/04/2017   PLT 248 06/04/2017    BMET    Component Value Date/Time   NA 138 06/04/2017 0841   NA 143 08/11/2016 0824   K 3.6 06/04/2017 0841   CL 104 06/04/2017 0841   CO2 26 06/04/2017 0841   GLUCOSE 136 (H) 06/04/2017 0841   BUN 19 06/04/2017 0841   BUN 19 08/11/2016 0824   CREATININE 1.12 (H) 06/04/2017 0841   CALCIUM 8.5 (L) 06/04/2017 0841   GFRNONAA 46 (L) 06/04/2017 0841   GFRAA 53 (  L) 06/04/2017 0841   Estimated Creatinine Clearance: 38 mL/min (A) (by C-G formula based on SCr of 1.12 mg/dL (H)).  COAG Lab Results  Component Value Date   INR 1.46 05/26/2017    Radiology Dg Chest 2 View  Result Date: 06/04/2017 CLINICAL DATA:  Cough EXAM: CHEST  2 VIEW COMPARISON:  05/26/2017 FINDINGS: Cardiac shadow remains enlarged. The lungs are well aerated bilaterally. No focal infiltrate or sizable effusion is seen. Degenerative changes of thoracic spine are noted. IMPRESSION: No acute abnormality noted. Electronically Signed   By: Inez Catalina M.D.   On: 06/04/2017 09:07   Dg Chest 2 View  Result Date: 05/26/2017 CLINICAL DATA:  Altered mental status EXAM: CHEST  2 VIEW COMPARISON:  04/15/2017 FINDINGS: Chronic cardiomegaly. Negative aortic and hilar contours. There is no edema, consolidation, effusion, or pneumothorax. Spondylosis. No acute osseous finding. IMPRESSION: No evidence of active disease. Cardiomegaly. Electronically Signed   By: Monte Fantasia M.D.   On: 05/26/2017 10:21   Ct Head Wo Contrast  Result Date: 06/04/2017 CLINICAL DATA:  Syncope and fall today. EXAM: CT HEAD WITHOUT CONTRAST TECHNIQUE: Contiguous axial images were  obtained from the base of the skull through the vertex without intravenous contrast. COMPARISON:  Head CT scan 05/26/2017. FINDINGS: Brain: Atrophy and chronic microvascular ischemic change are identified. No evidence of acute intracranial abnormality including hemorrhage, infarct, mass lesion, mass effect, midline shift or abnormal extra-axial fluid collection. No hydrocephalus or pneumocephalus. Vascular: Atherosclerosis noted. Skull: Intact. Sinuses/Orbits: Negative. Other: None. IMPRESSION: No acute abnormality. Atrophy and chronic microvascular ischemic change. Atherosclerosis. Electronically Signed   By: Inge Rise M.D.   On: 06/04/2017 10:48   Ct Head Wo Contrast  Result Date: 05/26/2017 CLINICAL DATA:  Altered mental status EXAM: CT HEAD WITHOUT CONTRAST TECHNIQUE: Contiguous axial images were obtained from the base of the skull through the vertex without intravenous contrast. COMPARISON:  None. FINDINGS: Brain: There is atrophy and chronic small vessel disease changes. No acute intracranial abnormality. Specifically, no hemorrhage, hydrocephalus, mass lesion, acute infarction, or significant intracranial injury. Vascular: No hyperdense vessel or unexpected calcification. Skull: No acute calvarial abnormality. Sinuses/Orbits: Visualized paranasal sinuses and mastoids clear. Orbital soft tissues unremarkable. Other: None IMPRESSION: No acute intracranial abnormality. Atrophy, chronic microvascular disease. Electronically Signed   By: Rolm Baptise M.D.   On: 05/26/2017 10:09     Assessment/Plan 1. Leg pain, bilateral  Recommend:  The patient has atypical pain symptoms for pure atherosclerotic disease. However, on physical exam there is evidence of mixed venous and arterial disease, given the diminished pulses and the edema associated with venous changes of the legs.  Noninvasive studies including ABI's and venous ultrasound of the legs will be obtained and the patient will follow up with me  to review these studies.  The patient should continue walking and begin a more formal exercise program. The patient should continue his antiplatelet therapy and aggressive treatment of the lipid abnormalities.  The patient should begin wearing graduated compression socks 15-20 mmHg strength to control edema.   - VAS [place] LOWER EXTREMITY VENOUS REFLUX; Future - VAS [place] ABI WITH/WO TBI; Future  2. PAD (peripheral artery disease) ([place]) See #1 - VAS [place] ABI WITH/WO TBI; Future  3. Chronic venous insufficiency See #1; I believe the majority of her lower extremity symptoms are secondary to her sedentary life at this point. Given the advanced nature of her dementia ambulation and exercise programs are extremely difficult as the patient has very withdrawn and will not  participate. I have recommended sensitivity foot socks which can be applied at her memory care facility in the morning and removed in the evening. In the future we may need to increase the strength but I would start with these mild softer compression and see what benefit we are achieving. - VAS [place] LOWER EXTREMITY VENOUS REFLUX; Future  4. Leg swelling See #3  5. Alzheimer's dementia with behavioral disturbance, unspecified timing of dementia onset Continue memory care.  6. Essential hypertension Continue antihypertensive medications as already ordered, these medications have been reviewed and there are no changes at this time.   7. Hyperlipidemia LDL goal <100 Continue statin as ordered and reviewed, no changes at this time     Hortencia Pilar, MD  06/22/2017 10:21 AM

## 2017-07-07 ENCOUNTER — Other Ambulatory Visit: Payer: Self-pay | Admitting: Family Medicine

## 2017-07-08 NOTE — Telephone Encounter (Signed)
Dr. Fletcher Anon approved her metoprolol on April 17th for 6 months I approved her memantine on Jan 3rd for one whole year She shouldn't need refills yet

## 2017-07-09 NOTE — Telephone Encounter (Signed)
Spoke with the Pharmacist pt has refills and do not need a refill.

## 2017-07-15 ENCOUNTER — Ambulatory Visit (INDEPENDENT_AMBULATORY_CARE_PROVIDER_SITE_OTHER): Payer: Medicare Other | Admitting: Family Medicine

## 2017-07-15 ENCOUNTER — Encounter: Payer: Self-pay | Admitting: Family Medicine

## 2017-07-15 VITALS — BP 120/80 | HR 82 | Temp 97.8°F | Resp 14 | Ht 62.0 in | Wt 148.6 lb

## 2017-07-15 DIAGNOSIS — R8290 Unspecified abnormal findings in urine: Secondary | ICD-10-CM | POA: Diagnosis not present

## 2017-07-15 DIAGNOSIS — N390 Urinary tract infection, site not specified: Secondary | ICD-10-CM | POA: Diagnosis not present

## 2017-07-15 MED ORDER — DOXYCYCLINE HYCLATE 100 MG PO TABS: 100.0000 mg | ORAL_TABLET | Freq: Two times a day (BID) | ORAL | 0 refills | Status: AC

## 2017-07-15 NOTE — Progress Notes (Addendum)
Name: Jennifer Klein   MRN: [id number]    DOB: January 09, 1941   Date:07/15/2017       Progress Note  Subjective  Chief Complaint  Chief Complaint  Patient presents with  . Urinary Tract Infection    odor    HPI  PT presents with her husband, [name], who provides most of the history due to patient's memory impairment. Per husband patient lives in memory care unit at [facility]. One day ago staff noticed foul smelling urine.  PT has trouble drinking enough water while there. Unsure if patient is continent of urine. Patient denies pain, husband is unsure if behavior changes.  When asked, patient denies pain with urination, back pain, nausea, or abdominal pain.  Patient Active Problem List   Diagnosis Date Noted  . PAD (peripheral artery disease) ([place]) 06/22/2017  . Chronic venous insufficiency 06/22/2017  . Leg swelling 06/22/2017  . Leg pain, bilateral 06/22/2017  . CKD (chronic kidney disease) stage 3, GFR 30-59 ml/min 09/04/2016  . B12 deficiency 05/06/2016  . Hyperlipidemia LDL goal <100 05/05/2016  . Alzheimer's dementia with behavioral disturbance 05/04/2016  . Medication monitoring encounter 05/04/2016  . Barrett's esophagus 11/09/2015  . Paroxysmal atrial fibrillation ([place]) 08/07/2013  . Osteopenia 08/08/2012  . Hypertension 06/03/2012  . Gout 06/03/2012    Social History  Substance Use Topics  . Smoking status: Former Smoker    Packs/day: 0.25    Years: 1.00    Types: Cigarettes  . Smokeless tobacco: Never Used  . Alcohol use 0.0 oz/week     Comment: occassionally     Current Outpatient Prescriptions:  .  atorvastatin (LIPITOR) 20 MG tablet, TAKE 1 TABLET BY MOUTH AT BEDTIME, Disp: 30 tablet, Rfl: 5 .  Cholecalciferol (VITAMIN D3) 2000 units TABS, Take 4,000 Units by mouth daily., Disp: , Rfl:  .  donepezil (ARICEPT) 10 MG tablet, TAKE 1 TABLET BY MOUTH AT BEDTIME. (Patient taking differently: TAKE 1 TABLET BY MOUTH DAILY.), Disp: 30 tablet, Rfl: 11 .  LORazepam  (ATIVAN) 0.5 MG tablet, Take 0.5 tablets (0.25 mg total) by mouth 2 (two) times daily as needed for anxiety. (new instructions), Disp: 30 tablet, Rfl: 0 .  memantine (NAMENDA) 10 MG tablet, TAKE 1 TABLET BY MOUTH TWICE DAILY, Disp: 60 tablet, Rfl: 11 .  pantoprazole (PROTONIX) 40 MG tablet, Take 1 tablet (40 mg total) by mouth daily. This replaces Zantac (ranitidine), Disp: 30 tablet, Rfl: 5 .  rivaroxaban (XARELTO) 20 MG TABS tablet, TAKE 1 TABLET BY MOUTH ONCE DAILY WITH SUPPER, Disp: 30 tablet, Rfl: 3 .  ULORIC 40 MG tablet, TAKE 1 TABLET BY MOUTH ONCE DAILY, Disp: 30 tablet, Rfl: 0 .  metoprolol tartrate (LOPRESSOR) 25 MG tablet, Take 1 tablet (25 mg total) by mouth 2 (two) times daily., Disp: 60 tablet, Rfl: 5  Allergies  Allergen Reactions  . Darvon [Propoxyphene Hcl]     As a child    ROS  Limited ROS due to patient's memory impairment.  See HPI for details.  Objective  Vitals:   07/15/17 1027  BP: 120/80  Pulse: 82  Resp: 14  Temp: 97.8 F (36.6 C)  TempSrc: Oral  SpO2: 94%  Weight: 148 lb 9.6 oz (67.4 kg)  Height: '5\' 2"'$  (1.575 m)   Body mass index is 27.18 kg/m.  Nursing Note and Vital Signs reviewed.  Physical Exam  Constitutional: Patient appears well-developed and well-nourished. No distress.  HEENT: head atraumatic, normocephalic Cardiovascular: Normal rate, regular rhythm, S1/S2 present.  No murmur or rub heard. No BLE edema. Pulmonary/Chest: Effort normal and breath sounds clear. No respiratory distress or retractions. Abdominal: Soft and non-tender, bowel sounds present x4 quadrants.  No CVA Tenderness GU: Visual inspection shows no obvious rash; no foul smell and no current incontinence. Psychiatric: Patient has a baseline mood and flat affect. behavior is cooperative and calm. Judgment and thought content baseline.  Recent Results (from the past 2160 hour(s))  CBC with Differential     Status: Abnormal   Collection Time: 05/26/17  9:47 AM  Result  Value Ref Range   WBC 11.8 (H) 3.6 - 11.0 K/uL   RBC 4.80 3.80 - 5.20 MIL/uL   Hemoglobin 14.2 12.0 - 16.0 g/dL   HCT 42.8 35.0 - 47.0 %   MCV 89.0 80.0 - 100.0 fL   MCH 29.6 26.0 - 34.0 pg   MCHC 33.2 32.0 - 36.0 g/dL   RDW 14.7 (H) 11.5 - 14.5 %   Platelets 330 150 - 440 K/uL   Neutrophils Relative % 85 %   Neutro Abs 10.1 (H) 1.4 - 6.5 K/uL   Lymphocytes Relative 11 %   Lymphs Abs 1.3 1.0 - 3.6 K/uL   Monocytes Relative 2 %   Monocytes Absolute 0.3 0.2 - 0.9 K/uL   Eosinophils Relative 1 %   Eosinophils Absolute 0.1 0 - 0.7 K/uL   Basophils Relative 1 %   Basophils Absolute 0.1 0 - 0.1 K/uL  Troponin I     Status: None   Collection Time: 05/26/17  9:47 AM  Result Value Ref Range   Troponin I <0.03 <0.03 ng/mL  Comprehensive metabolic panel     Status: Abnormal   Collection Time: 05/26/17  9:47 AM  Result Value Ref Range   Sodium 140 135 - 145 mmol/L   Potassium 3.9 3.5 - 5.1 mmol/L   Chloride 106 101 - 111 mmol/L   CO2 26 22 - 32 mmol/L   Glucose, Bld 187 (H) 65 - 99 mg/dL   BUN 19 6 - 20 mg/dL   Creatinine, Ser 1.31 (H) 0.44 - 1.00 mg/dL   Calcium 9.2 8.9 - 10.3 mg/dL   Total Protein 6.7 6.5 - 8.1 g/dL   Albumin 3.7 3.5 - 5.0 g/dL   AST 22 15 - 41 U/L   ALT 14 14 - 54 U/L   Alkaline Phosphatase 113 38 - 126 U/L   Total Bilirubin 1.0 0.3 - 1.2 mg/dL   GFR calc non Af Amer 38 (L) >60 mL/min   GFR calc Af Amer 44 (L) >60 mL/min    Comment: (NOTE) The eGFR has been calculated using the CKD EPI equation. This calculation has not been validated in all clinical situations. eGFR's persistently <60 mL/min signify possible Chronic Kidney Disease.    Anion gap 8 5 - 15  Urinalysis, Complete w Microscopic     Status: Abnormal   Collection Time: 05/26/17  9:47 AM  Result Value Ref Range   Color, Urine YELLOW (A) YELLOW   APPearance CLEAR (A) CLEAR   Specific Gravity, Urine 1.021 1.005 - 1.030   pH 5.0 5.0 - 8.0   Glucose, UA NEGATIVE NEGATIVE mg/dL   Hgb urine  dipstick NEGATIVE NEGATIVE   Bilirubin Urine NEGATIVE NEGATIVE   Ketones, ur NEGATIVE NEGATIVE mg/dL   Protein, ur NEGATIVE NEGATIVE mg/dL   Nitrite NEGATIVE NEGATIVE   Leukocytes, UA NEGATIVE NEGATIVE   RBC / HPF 0-5 0 - 5 RBC/hpf   WBC, UA 0-5 0 -  5 WBC/hpf   Bacteria, UA NONE SEEN NONE SEEN   Squamous Epithelial / LPF 0-5 (A) NONE SEEN  Protime-INR     Status: Abnormal   Collection Time: 05/26/17  9:47 AM  Result Value Ref Range   Prothrombin Time 17.9 (H) 11.4 - 15.2 seconds   INR 1.46   CBC with Differential     Status: Abnormal   Collection Time: 06/04/17  8:41 AM  Result Value Ref Range   WBC 6.6 3.6 - 11.0 K/uL   RBC 4.54 3.80 - 5.20 MIL/uL   Hemoglobin 13.9 12.0 - 16.0 g/dL   HCT 40.2 35.0 - 47.0 %   MCV 88.4 80.0 - 100.0 fL   MCH 30.6 26.0 - 34.0 pg   MCHC 34.6 32.0 - 36.0 g/dL   RDW 14.6 (H) 11.5 - 14.5 %   Platelets 248 150 - 440 K/uL   Neutrophils Relative % 69 %   Neutro Abs 4.6 1.4 - 6.5 K/uL   Lymphocytes Relative 20 %   Lymphs Abs 1.3 1.0 - 3.6 K/uL   Monocytes Relative 9 %   Monocytes Absolute 0.6 0.2 - 0.9 K/uL   Eosinophils Relative 2 %   Eosinophils Absolute 0.1 0 - 0.7 K/uL   Basophils Relative 0 %   Basophils Absolute 0.0 0 - 0.1 K/uL  Comprehensive metabolic panel     Status: Abnormal   Collection Time: 06/04/17  8:41 AM  Result Value Ref Range   Sodium 138 135 - 145 mmol/L   Potassium 3.6 3.5 - 5.1 mmol/L   Chloride 104 101 - 111 mmol/L   CO2 26 22 - 32 mmol/L   Glucose, Bld 136 (H) 65 - 99 mg/dL   BUN 19 6 - 20 mg/dL   Creatinine, Ser 1.12 (H) 0.44 - 1.00 mg/dL   Calcium 8.5 (L) 8.9 - 10.3 mg/dL   Total Protein 6.6 6.5 - 8.1 g/dL   Albumin 3.5 3.5 - 5.0 g/dL   AST 25 15 - 41 U/L   ALT 14 14 - 54 U/L   Alkaline Phosphatase 99 38 - 126 U/L   Total Bilirubin 1.0 0.3 - 1.2 mg/dL   GFR calc non Af Amer 46 (L) >60 mL/min   GFR calc Af Amer 53 (L) >60 mL/min    Comment: (NOTE) The eGFR has been calculated using the CKD EPI equation. This  calculation has not been validated in all clinical situations. eGFR's persistently <60 mL/min signify possible Chronic Kidney Disease.    Anion gap 8 5 - 15  Troponin I     Status: None   Collection Time: 06/04/17  8:41 AM  Result Value Ref Range   Troponin I <0.03 <0.03 ng/mL  Urine culture     Status: Abnormal   Collection Time: 06/04/17  8:41 AM  Result Value Ref Range   Specimen Description URINE, CLEAN CATCH    Special Requests NONE    Culture MULTIPLE SPECIES PRESENT, SUGGEST RECOLLECTION (A)    Report Status 06/05/2017 FINAL      Assessment & Plan  1. Urinary tract infection without hematuria, site unspecified - doxycycline (VIBRA-TABS) 100 MG tablet; Take 1 tablet (100 mg total) by mouth 2 (two) times daily.  Dispense: 14 tablet; Refill: 0 - Urine Culture - Urinalysis - Orders relayed to [facility], urine collection device and urine collection cup provided for patient's husband to bring back when possible.  - We will proactively treat with Abx due to patient's decreased communication ability and  report of foul-smelling urine.   -Red flags and when to present for emergency care or RTC including fever >101.6F, new/worsening/un-resolving symptoms, behavior changes, complaints of pain, reviewed with patient at time of visit. Follow up and care instructions discussed and provided in AVS.  I have reviewed this encounter including the documentation in this note and/or discussed this patient with the Johney Maine, FNP, NP-C. I am certifying that I agree with the content of this note as supervising physician.  Steele Sizer, MD [facility] 07/15/2017, 12:08 PM

## 2017-07-15 NOTE — Patient Instructions (Addendum)
Please take Doxycyline 100mg  Twice a day for 7 days.  Please return urine sample to [place] as soon as possible for testing.  Urinary Tract Infection, Adult A urinary tract infection (UTI) is an infection of any part of the urinary tract. The urinary tract includes the:  Kidneys.  Ureters.  Bladder.  Urethra.  These organs make, store, and get rid of pee (urine) in the body. Follow these instructions at home:  Take over-the-counter and prescription medicines only as told by your doctor.  If you were prescribed an antibiotic medicine, take it as told by your doctor. Do not stop taking the antibiotic even if you start to feel better.  Avoid the following drinks: ? Alcohol. ? Caffeine. ? Tea. ? Carbonated drinks.  Drink enough fluid to keep your pee clear or pale yellow.  Keep all follow-up visits as told by your doctor. This is important.  Make sure to: ? Empty your bladder often and completely. Do not to hold pee for long periods of time. ? Empty your bladder before and after sex. ? Wipe from front to back after a bowel movement if you are female. Use each tissue one time when you wipe. Contact a doctor if:  You have back pain.  You have a fever.  You feel sick to your stomach (nauseous).  You throw up (vomit).  Your symptoms do not get better after 3 days.  Your symptoms go away and then come back. Get help right away if:  You have very bad back pain.  You have very bad lower belly (abdominal) pain.  You are throwing up and cannot keep down any medicines or water. This information is not intended to replace advice given to you by your health care provider. Make sure you discuss any questions you have with your health care provider. Document Released: 05/25/2008 Document Revised: 05/14/2016 Document Reviewed: 10/28/2015 Elsevier Interactive Patient Education  [employer].

## 2017-07-16 LAB — URINE CULTURE

## 2017-07-16 LAB — URINALYSIS
BILIRUBIN URINE: NEGATIVE
GLUCOSE, UA: NEGATIVE
HGB URINE DIPSTICK: NEGATIVE
KETONES UR: NEGATIVE
Leukocytes, UA: NEGATIVE
NITRITE: NEGATIVE
PH: 5.5 (ref 5.0–8.0)
Protein, ur: NEGATIVE
Specific Gravity, Urine: 1.021 (ref 1.001–1.035)

## 2017-07-21 ENCOUNTER — Other Ambulatory Visit: Payer: Self-pay | Admitting: Family Medicine

## 2017-07-21 NOTE — Telephone Encounter (Signed)
Please have them get refills of Xarelto from Dr. Fletcher Anon; thank you

## 2017-07-23 ENCOUNTER — Other Ambulatory Visit: Payer: Self-pay | Admitting: Family Medicine

## 2017-07-26 ENCOUNTER — Other Ambulatory Visit: Payer: Self-pay

## 2017-07-26 ENCOUNTER — Other Ambulatory Visit: Payer: Self-pay | Admitting: Family Medicine

## 2017-07-26 MED ORDER — VITAMIN D 1000 UNITS PO TABS: 1000.0000 [IU] | ORAL_TABLET | Freq: Every day | ORAL | 5 refills | Status: DC

## 2017-07-26 MED ORDER — LORAZEPAM 0.5 MG PO TABS: 0.2500 mg | ORAL_TABLET | Freq: Two times a day (BID) | ORAL | 2 refills | Status: DC | PRN

## 2017-07-26 MED ORDER — PANTOPRAZOLE SODIUM 40 MG PO TBEC: 40.0000 mg | DELAYED_RELEASE_TABLET | Freq: Every day | ORAL | 5 refills | Status: DC

## 2017-07-26 NOTE — Telephone Encounter (Signed)
Ativan RX has been faxed over to  [facility] # [phone].

## 2017-07-26 NOTE — Progress Notes (Signed)
Low dose benzo to [facility]

## 2017-07-26 NOTE — Telephone Encounter (Signed)
Discontinue current vitamin D orders Start vitamin D3 1,000 iu one by mouth daily Fax to assisted living home

## 2017-07-26 NOTE — Telephone Encounter (Signed)
barretts esophagus; Rx for PPI approved

## 2017-07-30 ENCOUNTER — Telehealth: Payer: Self-pay

## 2017-07-30 NOTE — Telephone Encounter (Signed)
[facility] called wanted to report a unwitnessed fall on patient.  She denies any pain and there is no bruising or any other syptoms

## 2017-07-30 NOTE — Telephone Encounter (Signed)
Thank you :)

## 2017-08-04 ENCOUNTER — Other Ambulatory Visit: Payer: Self-pay | Admitting: Family Medicine

## 2017-08-04 MED ORDER — RIVAROXABAN 15 MG PO TABS: 15.0000 mg | ORAL_TABLET | Freq: Every day | ORAL | 5 refills | Status: DC

## 2017-08-04 NOTE — Telephone Encounter (Signed)
Patient's GFR reviewed; since it under 50, I'm going to have nursing home STOP her current Xarelto 20 mg START new Xarelto 15 mg one by mouth daily with supper Thank you

## 2017-08-11 ENCOUNTER — Other Ambulatory Visit: Payer: Self-pay | Admitting: Family Medicine

## 2017-08-11 NOTE — Telephone Encounter (Signed)
See previous note Patient should be taking just 1,000 iu of vitamin D3 once a day now 68 any old orders for the 2,000 iu strength Thank you

## 2017-08-12 NOTE — Telephone Encounter (Signed)
Pharmacy notified.

## 2017-08-13 ENCOUNTER — Other Ambulatory Visit: Payer: Self-pay

## 2017-08-13 MED ORDER — RIVAROXABAN 15 MG PO TABS: 15.0000 mg | ORAL_TABLET | Freq: Every day | ORAL | 5 refills | Status: DC

## 2017-08-13 MED ORDER — VITAMIN D 1000 UNITS PO TABS: 1000.0000 [IU] | ORAL_TABLET | Freq: Every day | ORAL | 5 refills | Status: DC

## 2017-08-13 NOTE — Telephone Encounter (Signed)
I've printed these out and you can fax them to pharmacy and group home if needed Thank you

## 2017-08-13 NOTE — Telephone Encounter (Signed)
[facility] They need new rx printed and faxed to them and for orders of dose change and they will send to pharmacy.

## 2017-08-29 ENCOUNTER — Emergency Department: Payer: Medicare Other

## 2017-08-29 ENCOUNTER — Encounter: Payer: Self-pay | Admitting: *Deleted

## 2017-08-29 ENCOUNTER — Emergency Department
Admission: EM | Admit: 2017-08-29 | Discharge: 2017-08-29 | Disposition: A | Discharge status: Home / Self Care | Payer: Medicare Other | Attending: Emergency Medicine | Admitting: Emergency Medicine

## 2017-08-29 DIAGNOSIS — W19XXXA Unspecified fall, initial encounter: Secondary | ICD-10-CM | POA: Diagnosis not present

## 2017-08-29 DIAGNOSIS — S0990XA Unspecified injury of head, initial encounter: Secondary | ICD-10-CM | POA: Diagnosis not present

## 2017-08-29 DIAGNOSIS — I5020 Unspecified systolic (congestive) heart failure: Secondary | ICD-10-CM | POA: Diagnosis not present

## 2017-08-29 DIAGNOSIS — N183 Chronic kidney disease, stage 3 (moderate): Secondary | ICD-10-CM | POA: Diagnosis not present

## 2017-08-29 DIAGNOSIS — R296 Repeated falls: Secondary | ICD-10-CM | POA: Diagnosis not present

## 2017-08-29 DIAGNOSIS — Z79899 Other long term (current) drug therapy: Secondary | ICD-10-CM | POA: Diagnosis not present

## 2017-08-29 DIAGNOSIS — I130 Hypertensive heart and chronic kidney disease with heart failure and stage 1 through stage 4 chronic kidney disease, or unspecified chronic kidney disease: Secondary | ICD-10-CM | POA: Insufficient documentation

## 2017-08-29 DIAGNOSIS — Z043 Encounter for examination and observation following other accident: Secondary | ICD-10-CM | POA: Diagnosis not present

## 2017-08-29 DIAGNOSIS — Z87891 Personal history of nicotine dependence: Secondary | ICD-10-CM | POA: Insufficient documentation

## 2017-08-29 DIAGNOSIS — G309 Alzheimer's disease, unspecified: Secondary | ICD-10-CM | POA: Insufficient documentation

## 2017-08-29 DIAGNOSIS — F0281 Dementia in other diseases classified elsewhere with behavioral disturbance: Secondary | ICD-10-CM | POA: Diagnosis not present

## 2017-08-29 DIAGNOSIS — Z7901 Long term (current) use of anticoagulants: Secondary | ICD-10-CM | POA: Diagnosis not present

## 2017-08-29 DIAGNOSIS — F0390 Unspecified dementia without behavioral disturbance: Secondary | ICD-10-CM | POA: Diagnosis not present

## 2017-08-29 DIAGNOSIS — R4182 Altered mental status, unspecified: Secondary | ICD-10-CM | POA: Diagnosis not present

## 2017-08-29 NOTE — ED Provider Notes (Signed)
[facility] Emergency Department Provider Note  ____________________________________________   I have reviewed the triage vital signs and the nursing notes.   HISTORY  Chief Complaint Fall    HPI Jennifer Klein is a 76 y.o. female  who suffers from dementia, she is at her baseline according to family at bedside. She had a non-witnessed fall. She doesn't remember falling. She was not unconscious to the best that we can determine. There was no evidence of injury or seizure. However she is on blood thinners and she was brought in for that reason. Patient does have a history of frequent falls. CLevel 5 chart caveat; no further history available due to patient status.    Past Medical History:  Diagnosis Date  . Alzheimer's dementia with behavioral disturbance 05/04/2016  . Alzheimer's dementia without behavioral disturbance 05/04/2016  . B12 deficiency 05/06/2016  . Gout   . Hemorrhoids   . Hyperlipidemia LDL goal <100 05/05/2016  . Hypertension   . Memory loss   . PAF (paroxysmal atrial fibrillation) ([place])    a. on Xarelto; b. 48-hr Holter 2014: NSR with PAF/flutter which happened at least 2.26% of the time w/ associated tachycardia. Patient was asymptomatic; c. CHADS2VASc => 4 (HTN, age x 2, female)  . Paroxysmal atrial fibrillation ([place]) 08/07/2013  . Senile osteoporosis   . Systolic dysfunction    a. echo 08/2013: EF 50-55%, mild MR, mildly dilated LA    Patient Active Problem List   Diagnosis Date Noted  . PAD (peripheral artery disease) ([place]) 06/22/2017  . Chronic venous insufficiency 06/22/2017  . Leg swelling 06/22/2017  . Leg pain, bilateral 06/22/2017  . CKD (chronic kidney disease) stage 3, GFR 30-59 ml/min 09/04/2016  . B12 deficiency 05/06/2016  . Hyperlipidemia LDL goal <100 05/05/2016  . Alzheimer's dementia with behavioral disturbance 05/04/2016  . Medication monitoring encounter 05/04/2016  . Barrett's esophagus 11/09/2015  . Paroxysmal  atrial fibrillation ([place]) 08/07/2013  . Osteopenia 08/08/2012  . Hypertension 06/03/2012  . Gout 06/03/2012    Past Surgical History:  Procedure Laterality Date  . TUBAL LIGATION      Prior to Admission medications   Medication Sig Start Date End Date Taking? Authorizing Provider  atorvastatin (LIPITOR) 20 MG tablet TAKE 1 TABLET BY MOUTH AT BEDTIME 04/09/17   Lada, Satira Anis, MD  cholecalciferol (VITAMIN D) 1000 units tablet Take 1 tablet (1,000 Units total) by mouth daily. 08/13/17   Lada, Satira Anis, MD  donepezil (ARICEPT) 10 MG tablet TAKE 1 TABLET BY MOUTH BEDTIME 08/04/17   Lada, Satira Anis, MD  LORazepam (ATIVAN) 0.5 MG tablet Take 0.5 tablets (0.25 mg total) by mouth 2 (two) times daily as needed for anxiety. (new instructions) 07/26/17   Lada, Satira Anis, MD  memantine (NAMENDA) 10 MG tablet TAKE 1 TABLET BY MOUTH TWICE DAILY 08/11/17   Arnetha Courser, MD  metoprolol tartrate (LOPRESSOR) 25 MG tablet Take 1 tablet (25 mg total) by mouth 2 (two) times daily. 04/06/17 07/05/17  Wellington Hampshire, MD  pantoprazole (PROTONIX) 40 MG tablet Take 1 tablet (40 mg total) by mouth daily. 07/26/17   Arnetha Courser, MD  Rivaroxaban (XARELTO) 15 MG TABS tablet Take 1 tablet (15 mg total) by mouth daily with supper. 08/13/17   Arnetha Courser, MD  ULORIC 40 MG tablet TAKE 1 TABLET BY MOUTH DAILY 07/21/17   Lada, Satira Anis, MD    Allergies Darvon [propoxyphene hcl]  Family History  Problem Relation Age of Onset  .  Heart disease Father   . Gout Brother     Social History Social History  Substance Use Topics  . Smoking status: Former Smoker    Packs/day: 0.25    Years: 1.00    Types: Cigarettes  . Smokeless tobacco: Never Used  . Alcohol use 0.0 oz/week     Comment: occassionally    Review of Systems Level 5 chart caveat; no further history available due to patient status.    ____________________________________________   PHYSICAL EXAM:  VITAL SIGNS: ED Triage Vitals  Enc Vitals  Group     BP 08/29/17 1511 122/81     Pulse Rate 08/29/17 1511 82     Resp 08/29/17 1511 18     Temp 08/29/17 1511 (!) 97.5 F (36.4 C)     Temp Source 08/29/17 1511 Oral     SpO2 08/29/17 1510 96 %     Weight 08/29/17 1512 150 lb (68 kg)     Height 08/29/17 1512 5\' 1"  (1.549 m)     Head Circumference --      Peak Flow --      Pain Score --      Pain Loc --      Pain Edu? --      Excl. in [place]? --     Constitutional: Alert and orientedto name and name of her family member, demented pleasantly in no acute distress.  Eyes: Conjunctivae are normal Head: Atraumatic HEENT: No congestion/rhinnorhea. Mucous membranes are moist.  Oropharynx non-erythematous Neck:   Nontender with no meningismus, no masses, no stridor Cardiovascular: Normal rate, regular rhythm. Grossly normal heart sounds.  Good peripheral circulation. Respiratory: Normal respiratory effort.  No retractions. Lungs CTAB. Abdominal: Soft and nontender. No distention. No guarding no rebound Back:  There is no focal tenderness or step off.  there is no midline tenderness there are no lesions noted. there is no CVA tenderness Musculoskeletal: No lower extremity tenderness, no upper extremity tenderness. No joint effusions, no DVT signs strong distal pulses no edema Neurologic:  Normal speech and language. No gross focal neurologic deficits are appreciated.  Skin:  Skin is warm, dry and intact. No rash noted. Psychiatric: Mood and affect are normal. Speech and behavior are normal.  ____________________________________________   LABS (all labs ordered are listed, but only abnormal results are displayed)  Labs Reviewed - No data to display ____________________________________________  EKG  I personally interpreted any EKGs ordered by me or triage  ____________________________________________  RADIOLOGY  I reviewed any imaging ordered by me or triage that were performed during my shift and, if possible, patient and/or  family made aware of any abnormal findings. ____________________________________________   PROCEDURES  Procedure(s) performed: None  Procedures  Critical Care performed: None  ____________________________________________   INITIAL IMPRESSION / ASSESSMENT AND PLAN / ED COURSE  Pertinent labs & imaging results that were available during my care of the patient were reviewed by me and considered in my medical decision making (see chart for details).  patient here with no evidence of injury, however, given that she is on blood thinners we will scan her.    ____________________________________________   FINAL CLINICAL IMPRESSION(S) / ED DIAGNOSES  Final diagnoses:  None      This chart was dictated using voice recognition software.  Despite best efforts to proofread,  errors can occur which can change meaning.      Schuyler Amor, MD 08/29/17 1534

## 2017-08-29 NOTE — ED Triage Notes (Signed)
Per EMS report, patient had an unwitnessed fall at [facility] today. No injuries or deformity noted. Patient was ambulatory at the scene. Patient has dementia, denies pain at this time. Painad is 0.

## 2017-09-06 ENCOUNTER — Ambulatory Visit (INDEPENDENT_AMBULATORY_CARE_PROVIDER_SITE_OTHER): Payer: Medicare Other

## 2017-09-06 ENCOUNTER — Encounter (INDEPENDENT_AMBULATORY_CARE_PROVIDER_SITE_OTHER): Payer: Self-pay

## 2017-09-06 ENCOUNTER — Ambulatory Visit (INDEPENDENT_AMBULATORY_CARE_PROVIDER_SITE_OTHER): Payer: Medicare Other | Admitting: Vascular Surgery

## 2017-09-06 ENCOUNTER — Encounter (INDEPENDENT_AMBULATORY_CARE_PROVIDER_SITE_OTHER): Payer: Self-pay | Admitting: Vascular Surgery

## 2017-09-06 VITALS — BP 126/90 | HR 89 | Resp 16 | Wt 143.4 lb

## 2017-09-06 DIAGNOSIS — I480 Paroxysmal atrial fibrillation: Secondary | ICD-10-CM

## 2017-09-06 DIAGNOSIS — E785 Hyperlipidemia, unspecified: Secondary | ICD-10-CM | POA: Diagnosis not present

## 2017-09-06 DIAGNOSIS — I999 Unspecified disorder of circulatory system: Secondary | ICD-10-CM

## 2017-09-06 DIAGNOSIS — I872 Venous insufficiency (chronic) (peripheral): Secondary | ICD-10-CM | POA: Diagnosis not present

## 2017-09-06 DIAGNOSIS — M79604 Pain in right leg: Secondary | ICD-10-CM | POA: Diagnosis not present

## 2017-09-06 DIAGNOSIS — M79605 Pain in left leg: Secondary | ICD-10-CM | POA: Diagnosis not present

## 2017-09-06 DIAGNOSIS — I739 Peripheral vascular disease, unspecified: Secondary | ICD-10-CM | POA: Diagnosis not present

## 2017-09-06 NOTE — Progress Notes (Signed)
MRN : [id number]  Jennifer Klein is a 76 y.o. (10/20/1941) female who presents with chief complaint of  Chief Complaint  Patient presents with  . Follow-up    pt conv abi,ble ven reflux  .  History of Present Illness: The patient returns to the office for followup evaluation regarding leg swelling.  The swelling has improved quite a bit and the pain associated with swelling has decreased substantially. There have not been any interval development of a ulcerations or wounds.  Since the previous visit the patient has been wearing graduated compression stockings and has noted little significant improvement in the lymphedema. The patient has been using compression routinely morning until night.  The patient also states elevation during the day and exercise is being done too.     Current Meds  Medication Sig  . atorvastatin (LIPITOR) 20 MG tablet TAKE 1 TABLET BY MOUTH AT BEDTIME  . cholecalciferol (VITAMIN D) 1000 units tablet Take 1 tablet (1,000 Units total) by mouth daily.  Marland Kitchen donepezil (ARICEPT) 10 MG tablet TAKE 1 TABLET BY MOUTH BEDTIME  . LORazepam (ATIVAN) 0.5 MG tablet Take 0.5 tablets (0.25 mg total) by mouth 2 (two) times daily as needed for anxiety. (new instructions)  . memantine (NAMENDA) 10 MG tablet TAKE 1 TABLET BY MOUTH TWICE DAILY  . metoprolol tartrate (LOPRESSOR) 25 MG tablet   . pantoprazole (PROTONIX) 40 MG tablet Take 1 tablet (40 mg total) by mouth daily.  . Rivaroxaban (XARELTO) 15 MG TABS tablet Take 1 tablet (15 mg total) by mouth daily with supper.  Marland Kitchen ULORIC 40 MG tablet TAKE 1 TABLET BY MOUTH DAILY    Past Medical History:  Diagnosis Date  . Alzheimer's dementia with behavioral disturbance 05/04/2016  . Alzheimer's dementia without behavioral disturbance 05/04/2016  . B12 deficiency 05/06/2016  . Gout   . Hemorrhoids   . Hyperlipidemia LDL goal <100 05/05/2016  . Hypertension   . Memory loss   . PAF (paroxysmal atrial fibrillation) ([place])    a. on  Xarelto; b. 48-hr Holter 2014: NSR with PAF/flutter which happened at least 2.26% of the time w/ associated tachycardia. Patient was asymptomatic; c. CHADS2VASc => 4 (HTN, age x 2, female)  . Paroxysmal atrial fibrillation ([place]) 08/07/2013  . Senile osteoporosis   . Systolic dysfunction    a. echo 08/2013: EF 50-55%, mild MR, mildly dilated LA    Past Surgical History:  Procedure Laterality Date  . TUBAL LIGATION      Social History Social History  Substance Use Topics  . Smoking status: Former Smoker    Packs/day: 0.25    Years: 1.00    Types: Cigarettes  . Smokeless tobacco: Never Used  . Alcohol use 0.0 oz/week     Comment: occassionally    Family History Family History  Problem Relation Age of Onset  . Heart disease Father   . Gout Brother     Allergies  Allergen Reactions  . Darvon [Propoxyphene Hcl]     As a child     REVIEW OF SYSTEMS (Negative unless checked)  Constitutional: [] Weight loss  [] Fever  [] Chills Cardiac: [] Chest pain   [] Chest pressure   [] Palpitations   [] Shortness of breath when laying flat   [] Shortness of breath with exertion. Vascular:  [x] Pain in legs with walking   [x] Pain in legs at rest  [] History of DVT   [] Phlebitis   [x] Swelling in legs   [] Varicose veins   [] Non-healing ulcers Pulmonary:   [] Uses home  oxygen   [] Productive cough   [] Hemoptysis   [] Wheeze  [] COPD   [] Asthma Neurologic:  [] Dizziness   [] Seizures   [] History of stroke   [] History of TIA  [] Aphasia   [] Vissual changes   [] Weakness or numbness in arm   [] Weakness or numbness in leg Musculoskeletal:   [] Joint swelling   [x] Joint pain   [x] Low back pain Hematologic:  [] Easy bruising  [] Easy bleeding   [] Hypercoagulable state   [] Anemic Gastrointestinal:  [] Diarrhea   [] Vomiting  [] Gastroesophageal reflux/heartburn   [] Difficulty swallowing. Genitourinary:  [] Chronic kidney disease   [] Difficult urination  [] Frequent urination   [] Blood in urine Skin:  [] Rashes   [] Ulcers    Psychological:  [] History of anxiety   []  History of major depression.  Physical Examination  Vitals:   09/06/17 1341  BP: 126/90  Pulse: 89  Resp: 16  Weight: 143 lb 6.4 oz (65 kg)   Body mass index is 27.1 kg/m. Gen: WD/WN, NAD Head: [place]/AT, No temporalis wasting.  Ear/Nose/Throat: Hearing grossly intact, nares w/o erythema or drainage Eyes: PER, EOMI, sclera nonicteric.  Neck: Supple, no large masses.   Pulmonary:  Good air movement, no audible wheezing bilaterally, no use of accessory muscles.  Cardiac: RRR, no JVD Vascular: scattered varicosities present bilaterally.  Mild to moderate venous stasis changes to the legs bilaterally.  2+ soft pitting edema Vessel Right Left  Radial Palpable Palpable  PT Trace Palpable Trace Palpable  DP Trace Palpable Trace Palpable  Gastrointestinal: Non-distended. No guarding/no peritoneal signs.  Musculoskeletal: M/S 5/5 throughout.  No deformity or atrophy.  Neurologic: CN 2-12 intact. Symmetrical.  Speech is fluent. Motor exam as listed above. Psychiatric: Judgment intact, Mood & affect appropriate for pt's clinical situation. Dermatologic: No rashes or ulcers noted.  No changes consistent with cellulitis. Lymph : No lichenification or skin changes of chronic lymphedema.  CBC Lab Results  Component Value Date   WBC 6.6 06/04/2017   HGB 13.9 06/04/2017   HCT 40.2 06/04/2017   MCV 88.4 06/04/2017   PLT 248 06/04/2017    BMET    Component Value Date/Time   NA 138 06/04/2017 0841   NA 143 08/11/2016 0824   K 3.6 06/04/2017 0841   CL 104 06/04/2017 0841   CO2 26 06/04/2017 0841   GLUCOSE 136 (H) 06/04/2017 0841   BUN 19 06/04/2017 0841   BUN 19 08/11/2016 0824   CREATININE 1.12 (H) 06/04/2017 0841   CALCIUM 8.5 (L) 06/04/2017 0841   GFRNONAA 46 (L) 06/04/2017 0841   GFRAA 53 (L) 06/04/2017 0841   CrCl cannot be calculated (Patient's most recent lab result is older than the maximum 21 days allowed.).  COAG Lab Results   Component Value Date   INR 1.46 05/26/2017    Radiology Ct Head Wo Contrast  Result Date: 08/29/2017 CLINICAL DATA:  Head trauma.  On anticoagulation.  Fall. EXAM: CT HEAD WITHOUT CONTRAST TECHNIQUE: Contiguous axial images were obtained from the base of the skull through the vertex without intravenous contrast. COMPARISON:  CT 06/04/2017 FINDINGS: Brain: Moderate atrophy unchanged. Chronic microvascular ischemic change in the white matter left greater than right also unchanged. Negative for acute infarct.  Negative for hemorrhage or mass. Vascular: Negative for hyperdense vessel Skull: Negative for fracture Sinuses/Orbits: Negative Other: None IMPRESSION: Atrophy and chronic ischemic changes in the white matter. No acute abnormality. Electronically Signed   By: Franchot Gallo M.D.   On: 08/29/2017 16:28    Assessment/Plan 1. Chronic venous insufficiency Recommend:  The patient is complaining of venous insufficiency.    I have had a long discussion with the patient regarding  Venous disease and why they cause symptoms.  Patient will begin wearing graduated compression stockings on a daily basis, beginning first thing in the morning and removing them in the evening. The patient is instructed specifically not to sleep in the stockings.    The patient  will also begin using over-the-counter analgesics such as Motrin 600 mg po TID to help control the symptoms as needed.    In addition, behavioral modification including elevation during the day will be initiated, utilizing a recliner was recommended.  The patient is also instructed to continue exercising such as walking 4-5 times per week.  At this time the patient wishes to continue conservative therapy and is not interested in more invasive treatments such as laser ablation and sclerotherapy.  The Patient will follow up PRN if the symptoms worsen.  2. Paroxysmal atrial fibrillation (HCC) Continue antiarrhythmia medications as already  ordered, these medications have been reviewed and there are no changes at this time.  Continue anticoagulation as ordered by Cardiology Service   3. Hyperlipidemia LDL goal <100 Continue statin as ordered and reviewed, no changes at this time   4. Leg pain, bilateral Recommend:  I do not find evidence of Vascular pathology that would explain the patient's symptoms  The patient has atypical pain symptoms for vascular disease  Noninvasive studies including venous ultrasound of the legs do not identify vascular problems  The patient should continue walking and begin a more formal exercise program. The patient should continue his antiplatelet therapy and aggressive treatment of the lipid abnormalities. The patient should begin wearing graduated compression socks 15-20 mmHg strength to control her mild edema.  Patient will follow-up with me on a PRN basis  Further work-up of her lower extremity pain is deferred to the primary service       Hortencia Pilar, MD  09/06/2017 7:42 PM

## 2017-09-10 ENCOUNTER — Encounter: Payer: Self-pay | Admitting: Family Medicine

## 2017-09-10 ENCOUNTER — Ambulatory Visit (INDEPENDENT_AMBULATORY_CARE_PROVIDER_SITE_OTHER): Payer: Medicare Other | Admitting: Family Medicine

## 2017-09-10 VITALS — BP 122/86 | HR 100 | Temp 97.9°F | Resp 16 | Ht 61.0 in | Wt 148.9 lb

## 2017-09-10 DIAGNOSIS — Z23 Encounter for immunization: Secondary | ICD-10-CM | POA: Diagnosis not present

## 2017-09-10 DIAGNOSIS — K2270 Barrett's esophagus without dysplasia: Secondary | ICD-10-CM | POA: Diagnosis not present

## 2017-09-10 DIAGNOSIS — W19XXXA Unspecified fall, initial encounter: Secondary | ICD-10-CM | POA: Insufficient documentation

## 2017-09-10 DIAGNOSIS — N183 Chronic kidney disease, stage 3 unspecified: Secondary | ICD-10-CM

## 2017-09-10 DIAGNOSIS — I999 Unspecified disorder of circulatory system: Secondary | ICD-10-CM | POA: Diagnosis not present

## 2017-09-10 DIAGNOSIS — I480 Paroxysmal atrial fibrillation: Secondary | ICD-10-CM

## 2017-09-10 DIAGNOSIS — F0280 Dementia in other diseases classified elsewhere without behavioral disturbance: Secondary | ICD-10-CM

## 2017-09-10 DIAGNOSIS — G309 Alzheimer's disease, unspecified: Secondary | ICD-10-CM | POA: Diagnosis not present

## 2017-09-10 DIAGNOSIS — W19XXXD Unspecified fall, subsequent encounter: Secondary | ICD-10-CM | POA: Diagnosis not present

## 2017-09-10 MED ORDER — LORAZEPAM 0.5 MG PO TABS: 0.2500 mg | ORAL_TABLET | Freq: Every day | ORAL | 1 refills | Status: DC

## 2017-09-10 NOTE — Assessment & Plan Note (Signed)
Patient goes to see nephrologist next week

## 2017-09-10 NOTE — Progress Notes (Addendum)
BP 122/86 (BP Location: Left Arm, Patient Position: Sitting, Cuff Size: Large)   Pulse 100   Temp 97.9 F (36.6 C) (Oral)   Resp 16   Ht 5\' 1"  (1.549 m)   Wt 148 lb 14.4 oz (67.5 kg)   SpO2 97%   BMI 28.13 kg/m    Subjective:    Patient ID: Jennifer Klein, female    DOB: 13-May-1941, 76 y.o.   MRN: [id number]  HPI: Jennifer Klein is a 76 y.o. female  Chief Complaint  Patient presents with  . Medication Refill    HPI Patient is here from [place] with her husband She has Alzheimer's dementia and is in a home; she is unable to provide much reliable history She had an unwitnessed fall on sept 9th, was taken to the ER ER note, head CT reviewed Head CT report was reviewed by me with husband today, showed atrophy, chronic ischemic changes, but no bleed Husband says that she is at her baseline, perhaps even a little better than a month ago Appetite is good Sleeps plenty he says She walks plenty up and down the halls of the residence where she stays Down to the 15 mg xarelto now Some bruises, no bleeding from mouth or nose; no comments about hematuria or BRPRP No recent gout flares On protonix for Barrett's Still sees nephrologist, sees him next week Got a good report from the vein doctor; wearing socks now and doing well  Depression screen [facility] 2/9 09/10/2017 06/02/2017 05/11/2017 01/19/2017 09/04/2016  Decreased Interest 0 0 0 0 0  Down, Depressed, Hopeless 0 0 0 0 0  PHQ - 2 Score 0 0 0 0 0  Altered sleeping - - - - -  Tired, decreased energy - - - - -  Change in appetite - - - - -  Feeling bad or failure about yourself  - - - - -  Trouble concentrating - - - - -  Moving slowly or fidgety/restless - - - - -  Suicidal thoughts - - - - -  PHQ-9 Score - - - - -    Relevant past medical, surgical, family and social history reviewed Past Medical History:  Diagnosis Date  . Alzheimer's dementia with behavioral disturbance 05/04/2016  . Alzheimer's dementia without  behavioral disturbance 05/04/2016  . B12 deficiency 05/06/2016  . Gout   . Hemorrhoids   . Hyperlipidemia LDL goal <100 05/05/2016  . Hypertension   . Memory loss   . PAF (paroxysmal atrial fibrillation) ([place])    a. on Xarelto; b. 48-hr Holter 2014: NSR with PAF/flutter which happened at least 2.26% of the time w/ associated tachycardia. Patient was asymptomatic; c. CHADS2VASc => 4 (HTN, age x 2, female)  . Paroxysmal atrial fibrillation ([place]) 08/07/2013  . Senile osteoporosis   . Systolic dysfunction    a. echo 08/2013: EF 50-55%, mild MR, mildly dilated LA   Past Surgical History:  Procedure Laterality Date  . TUBAL LIGATION     Family History  Problem Relation Age of Onset  . Heart disease Father   . Gout Brother    Social History   Social History  . Marital status: Married    Spouse name: N/A  . Number of children: N/A  . Years of education: N/A   Occupational History  . Not on file.   Social History Main Topics  . Smoking status: Former Smoker    Packs/day: 0.25    Years: 1.00  Types: Cigarettes  . Smokeless tobacco: Never Used  . Alcohol use No  . Drug use: No  . Sexual activity: Not on file   Other Topics Concern  . Not on file   Social History Narrative   Lives with husband in [place].   Worked at [employer] and [employer].      Diet - regular          Interim medical history since last visit reviewed. Allergies and medications reviewed  Review of Systems Per HPI unless specifically indicated above     Objective:    BP 122/86 (BP Location: Left Arm, Patient Position: Sitting, Cuff Size: Large)   Pulse 100   Temp 97.9 F (36.6 C) (Oral)   Resp 16   Ht 5\' 1"  (1.549 m)   Wt 148 lb 14.4 oz (67.5 kg)   SpO2 97%   BMI 28.13 kg/m   Wt Readings from Last 3 Encounters:  09/10/17 148 lb 14.4 oz (67.5 kg)  09/06/17 143 lb 6.4 oz (65 kg)  08/29/17 150 lb (68 kg)    Physical Exam  Constitutional: She appears well-developed and well-nourished.  HENT:    Mouth/Throat: Mucous membranes are normal.  Eyes: EOM are normal. No scleral icterus.  Cardiovascular: Normal rate.  An irregularly irregular rhythm present.  Pulmonary/Chest: Effort normal and breath sounds normal.  Neurological:  Gait is a little slow, rather deliberate; did not need any assistance  Psychiatric: Her behavior is normal. Her mood appears not anxious. Her affect is blunt. Her affect is not inappropriate. Cognition and memory are impaired. She does not express inappropriate judgment. She does not exhibit a depressed mood. She exhibits abnormal recent memory.   Results for orders placed or performed in visit on 07/15/17  Urine Culture  Result Value Ref Range   Organism ID, Bacteria      Three or more organisms present,each greater than 10,000 CFU/mL.These organisms,commonly found on external and internal genitalia,are considered to be colonizers.No further testing performed.   Urinalysis  Result Value Ref Range   Color, Urine DARK YELLOW YELLOW   APPearance CLEAR CLEAR   Specific Gravity, Urine 1.021 1.001 - 1.035   pH 5.5 5.0 - 8.0   Glucose, UA NEGATIVE NEGATIVE   Bilirubin Urine NEGATIVE NEGATIVE   Ketones, ur NEGATIVE NEGATIVE   Hgb urine dipstick NEGATIVE NEGATIVE   Protein, ur NEGATIVE NEGATIVE   Nitrite NEGATIVE NEGATIVE   Leukocytes, UA NEGATIVE NEGATIVE      Assessment & Plan:   Problem List Items Addressed This Visit      Cardiovascular and Mediastinum   Paroxysmal atrial fibrillation (HCC)    Sounds to be in a-fib today, rate controlled; on xarelto, lower dose now due to CrCl        Digestive   Barrett's esophagus    Continue PPI        Nervous and Auditory   Alzheimer's dementia without behavioral disturbance - Primary    No behavioral issues; will stop the morning lorazepam, which may decrease somnolence; husband agrees; new order written; continue aricept and namenda      Relevant Medications   LORazepam (ATIVAN) 0.5 MG tablet      Genitourinary   CKD (chronic kidney disease) stage 3, GFR 30-59 ml/min    Patient goes to see nephrologist next week        Other   Fall    Glad patient was checked out in the ER during my absence; head CT reviewed; report shared  with husband; ambulation observed; since falls are unwitnessed without injury, wonder if she is sitting down instead of actually falling       Other Visit Diagnoses    Needs flu shot       Relevant Orders   Flu vaccine HIGH DOSE PF (Fluzone High dose) (Completed)       Follow up plan: Return in about 6 months (around 03/10/2018) for twenty minute follow-up with fasting labs.  An after-visit summary was printed and given to the patient at [facility].  Please see the patient instructions which may contain other information and recommendations beyond what is mentioned above in the assessment and plan.  Meds ordered this encounter  Medications  . LORazepam (ATIVAN) 0.5 MG tablet    Sig: Take 0.5 tablets (0.25 mg total) by mouth at bedtime. (new instructions)    Dispense:  15 tablet    Refill:  1    Discontinue current lorazepam orders; use this instead ONLY    Orders Placed This Encounter  Procedures  . Flu vaccine HIGH DOSE PF (Fluzone High dose)

## 2017-09-10 NOTE — Assessment & Plan Note (Signed)
Continue PPI ?

## 2017-09-10 NOTE — Assessment & Plan Note (Signed)
No behavioral issues; will stop the morning lorazepam, which may decrease somnolence; husband agrees; new order written; continue aricept and namenda

## 2017-09-10 NOTE — Patient Instructions (Signed)
You can get your next flu shot in the Fall of 2019

## 2017-09-10 NOTE — Assessment & Plan Note (Signed)
Sounds to be in a-fib today, rate controlled; on xarelto, lower dose now due to CrCl

## 2017-09-10 NOTE — Assessment & Plan Note (Signed)
Glad patient was checked out in the ER during my absence; head CT reviewed; report shared with husband; ambulation observed; since falls are unwitnessed without injury, wonder if she is sitting down instead of actually falling

## 2017-09-24 ENCOUNTER — Telehealth: Payer: Self-pay

## 2017-09-24 ENCOUNTER — Emergency Department
Admission: EM | Admit: 2017-09-24 | Discharge: 2017-09-24 | Disposition: A | Discharge status: Home / Self Care | Payer: Medicare Other | Attending: Emergency Medicine | Admitting: Emergency Medicine

## 2017-09-24 ENCOUNTER — Encounter: Payer: Self-pay | Admitting: Emergency Medicine

## 2017-09-24 ENCOUNTER — Emergency Department: Payer: Medicare Other

## 2017-09-24 DIAGNOSIS — G309 Alzheimer's disease, unspecified: Secondary | ICD-10-CM | POA: Insufficient documentation

## 2017-09-24 DIAGNOSIS — I129 Hypertensive chronic kidney disease with stage 1 through stage 4 chronic kidney disease, or unspecified chronic kidney disease: Secondary | ICD-10-CM | POA: Insufficient documentation

## 2017-09-24 DIAGNOSIS — Z87891 Personal history of nicotine dependence: Secondary | ICD-10-CM | POA: Insufficient documentation

## 2017-09-24 DIAGNOSIS — Z7901 Long term (current) use of anticoagulants: Secondary | ICD-10-CM | POA: Diagnosis not present

## 2017-09-24 DIAGNOSIS — M6281 Muscle weakness (generalized): Secondary | ICD-10-CM | POA: Diagnosis not present

## 2017-09-24 DIAGNOSIS — W1830XA Fall on same level, unspecified, initial encounter: Secondary | ICD-10-CM | POA: Diagnosis not present

## 2017-09-24 DIAGNOSIS — N183 Chronic kidney disease, stage 3 (moderate): Secondary | ICD-10-CM | POA: Diagnosis not present

## 2017-09-24 DIAGNOSIS — M542 Cervicalgia: Secondary | ICD-10-CM | POA: Insufficient documentation

## 2017-09-24 DIAGNOSIS — Z79899 Other long term (current) drug therapy: Secondary | ICD-10-CM | POA: Diagnosis not present

## 2017-09-24 DIAGNOSIS — W19XXXA Unspecified fall, initial encounter: Secondary | ICD-10-CM

## 2017-09-24 DIAGNOSIS — R4182 Altered mental status, unspecified: Secondary | ICD-10-CM | POA: Diagnosis not present

## 2017-09-24 DIAGNOSIS — S199XXA Unspecified injury of neck, initial encounter: Secondary | ICD-10-CM | POA: Diagnosis not present

## 2017-09-24 DIAGNOSIS — F0390 Unspecified dementia without behavioral disturbance: Secondary | ICD-10-CM | POA: Diagnosis not present

## 2017-09-24 DIAGNOSIS — R296 Repeated falls: Secondary | ICD-10-CM | POA: Diagnosis not present

## 2017-09-24 LAB — CBC WITH DIFFERENTIAL/PLATELET
BASOS ABS: 0 10*3/uL (ref 0–0.1)
BASOS PCT: 1 %
EOS ABS: 0.2 10*3/uL (ref 0–0.7)
EOS PCT: 3 %
HCT: 35.6 % (ref 35.0–47.0)
Hemoglobin: 12 g/dL (ref 12.0–16.0)
LYMPHS PCT: 13 %
Lymphs Abs: 1 10*3/uL (ref 1.0–3.6)
MCH: 30.6 pg (ref 26.0–34.0)
MCHC: 33.8 g/dL (ref 32.0–36.0)
MCV: 90.7 fL (ref 80.0–100.0)
Monocytes Absolute: 0.6 10*3/uL (ref 0.2–0.9)
Monocytes Relative: 9 %
Neutro Abs: 5.4 10*3/uL (ref 1.4–6.5)
Neutrophils Relative %: 74 %
PLATELETS: 235 10*3/uL (ref 150–440)
RBC: 3.92 MIL/uL (ref 3.80–5.20)
RDW: 14.5 % (ref 11.5–14.5)
WBC: 7.3 10*3/uL (ref 3.6–11.0)

## 2017-09-24 LAB — URINALYSIS, COMPLETE (UACMP) WITH MICROSCOPIC
Bacteria, UA: NONE SEEN
Bilirubin Urine: NEGATIVE
GLUCOSE, UA: NEGATIVE mg/dL
HGB URINE DIPSTICK: NEGATIVE
Ketones, ur: NEGATIVE mg/dL
LEUKOCYTES UA: NEGATIVE
NITRITE: NEGATIVE
PROTEIN: NEGATIVE mg/dL
Specific Gravity, Urine: 1.003 — ABNORMAL LOW (ref 1.005–1.030)
pH: 6 (ref 5.0–8.0)

## 2017-09-24 LAB — TROPONIN I: Troponin I: <0.03 ng/mL (ref <0.03)

## 2017-09-24 LAB — BASIC METABOLIC PANEL
Anion gap: 8 (ref 5–15)
BUN: 19 mg/dL (ref 6–20)
CALCIUM: 8.5 mg/dL — AB (ref 8.9–10.3)
CO2: 26 mmol/L (ref 22–32)
Chloride: 106 mmol/L (ref 101–111)
Creatinine, Ser: 1.15 mg/dL — ABNORMAL HIGH (ref 0.44–1.00)
GFR calc Af Amer: 52 mL/min — ABNORMAL LOW (ref >60)
GFR, EST NON AFRICAN AMERICAN: 45 mL/min — AB (ref >60)
GLUCOSE: 90 mg/dL (ref 65–99)
POTASSIUM: 3.6 mmol/L (ref 3.5–5.1)
SODIUM: 140 mmol/L (ref 135–145)

## 2017-09-24 NOTE — ED Notes (Signed)
Assisted RN with labs, EKG, in and out cath and moved pt up in bed. Pt covered and knee area raised. Pt on bed monitor.

## 2017-09-24 NOTE — ED Notes (Signed)
Pt has shoes on. Yellow armband applied. Fall alarm on pt

## 2017-09-24 NOTE — Telephone Encounter (Signed)
Glad she is getting checked out

## 2017-09-24 NOTE — ED Notes (Signed)
Spoke with [employer] at [employer]. Pt at baseline mental status. Did ambulate after fall.  Husband wanted checked out in case hit head.

## 2017-09-24 NOTE — Telephone Encounter (Signed)
[facility] called they have sent patient to ER, she fell back and hit head.  They we have patient checked out

## 2017-09-24 NOTE — ED Notes (Signed)
Patient transported to CT 

## 2017-09-24 NOTE — Discharge Instructions (Signed)
You were evaluated after an unwitnessed fall, no serious injury suspected. Return to the emergency department immediately for any worsening condition including uncontrolled pain, altered mental status, or any other symptoms concerning to you.

## 2017-09-24 NOTE — ED Notes (Signed)
Called ems for transport  1600

## 2017-09-24 NOTE — ED Provider Notes (Addendum)
[facility] Emergency Department Provider Note ____________________________________________   I have reviewed the triage vital signs and the triage nursing note.  HISTORY  Chief Complaint Fall   Historian Patient history limited by poor historian, dementia. Level 5 caveat nursing home report I spoke with the husband by phone.  HPI Jennifer Klein is a 76 y.o. female presents from nursing home after being heard a thump in her bathroom, unwitnessed fall. Nursing staff had apparently told the husband by phone that she may have had a bump on the back of her head. Patient herself is telling me she has a little bit of sore neck.  Denies chest pain or trouble breathing. Denies abdominal pain. Denies back pain. Denies extremity pains or any evidence of trauma or deformity.  I spoke with her husband by phone who feels like he's been trying to keep her well-hydrated but is interested in checking laboratory studies and urinalysis are she is here.    Past Medical History:  Diagnosis Date  . Alzheimer's dementia with behavioral disturbance 05/04/2016  . Alzheimer's dementia without behavioral disturbance 05/04/2016  . B12 deficiency 05/06/2016  . Gout   . Hemorrhoids   . Hyperlipidemia LDL goal <100 05/05/2016  . Hypertension   . Memory loss   . PAF (paroxysmal atrial fibrillation) ([place])    a. on Xarelto; b. 48-hr Holter 2014: NSR with PAF/flutter which happened at least 2.26% of the time w/ associated tachycardia. Patient was asymptomatic; c. CHADS2VASc => 4 (HTN, age x 2, female)  . Paroxysmal atrial fibrillation ([place]) 08/07/2013  . Senile osteoporosis   . Systolic dysfunction    a. echo 08/2013: EF 50-55%, mild MR, mildly dilated LA    Patient Active Problem List   Diagnosis Date Noted  . Fall 09/10/2017  . PAD (peripheral artery disease) ([place]) 06/22/2017  . Chronic venous insufficiency 06/22/2017  . CKD (chronic kidney disease) stage 3, GFR 30-59 ml/min ([facility])  09/04/2016  . B12 deficiency 05/06/2016  . Hyperlipidemia LDL goal <100 05/05/2016  . Alzheimer's dementia without behavioral disturbance 05/04/2016  . Medication monitoring encounter 05/04/2016  . Barrett's esophagus 11/09/2015  . Paroxysmal atrial fibrillation ([place]) 08/07/2013  . Osteopenia 08/08/2012  . Hypertension 06/03/2012  . Gout 06/03/2012    Past Surgical History:  Procedure Laterality Date  . TUBAL LIGATION      Prior to Admission medications   Medication Sig Start Date End Date Taking? Authorizing Provider  atorvastatin (LIPITOR) 20 MG tablet TAKE 1 TABLET BY MOUTH AT BEDTIME 04/09/17  Yes Lada, Satira Anis, MD  cholecalciferol (VITAMIN D) 1000 units tablet Take 1 tablet (1,000 Units total) by mouth daily. 08/13/17  Yes Lada, Satira Anis, MD  donepezil (ARICEPT) 10 MG tablet TAKE 1 TABLET BY MOUTH BEDTIME 08/04/17  Yes Lada, Satira Anis, MD  LORazepam (ATIVAN) 0.5 MG tablet Take 0.5 tablets (0.25 mg total) by mouth at bedtime. (new instructions) Patient taking differently: Take 0.5 mg by mouth 2 (two) times daily. (new instructions) 09/10/17  Yes Lada, Satira Anis, MD  memantine (NAMENDA) 10 MG tablet TAKE 1 TABLET BY MOUTH TWICE DAILY 08/11/17  Yes Lada, Satira Anis, MD  metoprolol tartrate (LOPRESSOR) 25 MG tablet Take 1 tablet (25 mg total) by mouth 2 (two) times daily. 04/06/17 09/24/17 Yes Wellington Hampshire, MD  pantoprazole (PROTONIX) 40 MG tablet Take 1 tablet (40 mg total) by mouth daily. 07/26/17  Yes Lada, Satira Anis, MD  Rivaroxaban (XARELTO) 15 MG TABS tablet Take 1 tablet (15 mg  total) by mouth daily with supper. 08/13/17  Yes Lada, Satira Anis, MD  ULORIC 40 MG tablet TAKE 1 TABLET BY MOUTH DAILY 07/21/17  Yes Lada, Satira Anis, MD    Allergies  Allergen Reactions  . Darvon [Propoxyphene Hcl]     As a child    Family History  Problem Relation Age of Onset  . Heart disease Father   . Gout Brother     Social History Social History  Substance Use Topics  . Smoking status:  Former Smoker    Packs/day: 0.25    Years: 1.00    Types: Cigarettes  . Smokeless tobacco: Never Used  . Alcohol use No    Review of Systems Level 5 caveat: Patient with dementia. Per the husband no reported recent illnesses or fevers. Falls are common for her. Patient herself denied headache, chest pain, back pain, extremity pain or shortness of breath. She does report some mild neck pain. ____________________________________________   PHYSICAL EXAM:  VITAL SIGNS: ED Triage Vitals  Enc Vitals Group     BP 09/24/17 1333 (!) 151/77     Pulse Rate 09/24/17 1333 68     Resp 09/24/17 1333 16     Temp 09/24/17 1333 98.3 F (36.8 C)     Temp Source 09/24/17 1333 Oral     SpO2 09/24/17 1333 98 %     Weight 09/24/17 1332 148 lb (67.1 kg)     Height 09/24/17 1332 5\' 1"  (1.549 m)     Head Circumference --      Peak Flow --      Pain Score --      Pain Loc --      Pain Edu? --      Excl. in [place]? --      Constitutional: Alert and Cooperative, but disoriented. Well appearing and in no distress. HEENT   Head: Normocephalic and atraumatic.      Eyes: Conjunctivae are normal. Pupils equal and round.       Ears:         Nose: No congestion/rhinnorhea.   Mouth/Throat: Mucous membranes are moist.   Neck: No stridor.  mild neck painwithout C-spine step-off. Cardiovascular/Chest: Normal rate, regular rhythm.  No murmurs, rubs, or gallops. Respiratory: Normal respiratory effort without tachypnea nor retractions. Breath sounds are clear and equal bilaterally. No wheezes/rales/rhonchi. Gastrointestinal: Soft. No distention, no guarding, no rebound. Nontender.    Genitourinary/rectal:Deferred Musculoskeletal: Nontender with normal range of motion in all extremities. No joint effusions.  No lower extremity tenderness.  No edema. Neurologic:  no slurred speech. No facial droop. Patient is a poor historian. No gross or focal neurologic deficits are appreciated. Skin:  Skin is warm, dry  and intact. No rash noted. Psychiatric: Mood and affect are normal. Speech and behavior are normal. Patient exhibits appropriate insight and judgment.   ____________________________________________  LABS (pertinent positives/negatives) I, Lisa Roca, MD the attending physician have reviewed the labs noted below.  Labs Reviewed  BASIC METABOLIC PANEL - Abnormal; Notable for the following:       Result Value   Creatinine, Ser 1.15 (*)    Calcium 8.5 (*)    GFR calc non Af Amer 45 (*)    GFR calc Af Amer 52 (*)    All other components within normal limits  URINALYSIS, COMPLETE (UACMP) WITH MICROSCOPIC - Abnormal; Notable for the following:    Color, Urine YELLOW (*)    APPearance CLOUDY (*)    Specific Gravity, Urine  1.003 (*)    Squamous Epithelial / LPF TOO NUMEROUS TO COUNT (*)    All other components within normal limits  CBC WITH DIFFERENTIAL/PLATELET  TROPONIN I    ____________________________________________    EKG I, Lisa Roca, MD, the attending physician have personally viewed and interpreted all ECGs.  71 bpm. Normal sinus rhythm.right bundle branch block. Normal axis. Nonspecific ST and T-wave. ____________________________________________  RADIOLOGY All Xrays were viewed by me.  Imaging interpreted by Radiologist, and I, Lisa Roca, MD the attending physician have reviewed the radiologist interpretation noted below.  CT head and cervical spine without contrast:  IMPRESSION: 1. No CT evidence for acute intracranial abnormality. Atrophy and small vessel ischemic changes of the white matter. 2. Trace anterolisthesis of C4 on C5, likely degenerative. No acute fracture is seen __________________________________________  PROCEDURES  Procedure(s) performed: None  Critical Care performed: None  ____________________________________________  No current facility-administered medications on file prior to encounter.    Current Outpatient Prescriptions on  File Prior to Encounter  Medication Sig Dispense Refill  . atorvastatin (LIPITOR) 20 MG tablet TAKE 1 TABLET BY MOUTH AT BEDTIME 30 tablet 5  . cholecalciferol (VITAMIN D) 1000 units tablet Take 1 tablet (1,000 Units total) by mouth daily. 30 tablet 5  . donepezil (ARICEPT) 10 MG tablet TAKE 1 TABLET BY MOUTH BEDTIME 30 tablet 11  . LORazepam (ATIVAN) 0.5 MG tablet Take 0.5 tablets (0.25 mg total) by mouth at bedtime. (new instructions) (Patient taking differently: Take 0.5 mg by mouth 2 (two) times daily. (new instructions)) 15 tablet 1  . memantine (NAMENDA) 10 MG tablet TAKE 1 TABLET BY MOUTH TWICE DAILY 60 tablet 11  . metoprolol tartrate (LOPRESSOR) 25 MG tablet Take 1 tablet (25 mg total) by mouth 2 (two) times daily. 60 tablet 5  . pantoprazole (PROTONIX) 40 MG tablet Take 1 tablet (40 mg total) by mouth daily. 30 tablet 5  . Rivaroxaban (XARELTO) 15 MG TABS tablet Take 1 tablet (15 mg total) by mouth daily with supper. 30 tablet 5  . ULORIC 40 MG tablet TAKE 1 TABLET BY MOUTH DAILY 30 tablet 2    ____________________________________________  ED COURSE / ASSESSMENT AND PLAN  Pertinent labs & imaging results that were available during my care of the patient were reviewed by me and considered in my medical decision making (see chart for details).    patient is well-appearing Anddoes have significant dementia limiting her own history. He also thinks she is complaining about is a little bit of neck pain which seems to be chronic, but I am going to CT to her head and her neck given unwitnessed fall.  I spoke with her husband by phone who states that she does have chronic falls, but he would like her evaluated for any medical conditions that could make it more likely for her to fall today. I will send blood and urine sample.  Trauma scans are reassuring for no serious injury.  DIFFERENTIAL DIAGNOSIS: including but not limited to traumatic injury to the head, neck, trunk, extremities,  reasons for falling including urinary tract infection, dehydration, renal failure, electrolyte disturbance. CONSULTATIONS:  none  Patient / Family / Caregiver informed of clinical course, medical decision-making process, and agree with plan.   I discussed return precautions, follow-up instructions, and discharge instructions with patient and/or family.  Discharge Instructions :  You were evaluated after an unwitnessed fall, no serious injury suspected. Return to the emergency department immediately for any worsening condition including uncontrolled pain, altered  mental status, or any other symptoms concerning to you.  ___________________________________________   FINAL CLINICAL IMPRESSION(S) / ED DIAGNOSES   Final diagnoses:  Fall, initial encounter              Note: This dictation was prepared with Diplomatic Services operational officer dictation. Any transcriptional errors that result from this process are unintentional    Lisa Roca, MD 09/24/17 [place], MD 09/24/17 1535

## 2017-09-24 NOTE — ED Triage Notes (Signed)
Pt from [facility]. Unwitnessed fall. History frequent falls. Staff heard pt fall in bathroom but did not see fall. Pt denies any pain, family wanted her checked out. Dementia pt.

## 2017-09-24 NOTE — ED Notes (Signed)
In and out cath done with pam NAII. Sterile technique maintained.

## 2017-09-24 NOTE — ED Notes (Signed)
Cancelled EMS, as family will transport   [phone]

## 2017-10-05 ENCOUNTER — Other Ambulatory Visit: Payer: Self-pay | Admitting: Family Medicine

## 2017-10-06 DIAGNOSIS — N183 Chronic kidney disease, stage 3 (moderate): Secondary | ICD-10-CM | POA: Diagnosis not present

## 2017-10-06 DIAGNOSIS — I10 Essential (primary) hypertension: Secondary | ICD-10-CM | POA: Diagnosis not present

## 2017-10-06 DIAGNOSIS — N2581 Secondary hyperparathyroidism of renal origin: Secondary | ICD-10-CM | POA: Diagnosis not present

## 2017-10-06 DIAGNOSIS — R600 Localized edema: Secondary | ICD-10-CM | POA: Diagnosis not present

## 2017-10-09 ENCOUNTER — Other Ambulatory Visit: Payer: Self-pay | Admitting: Family Medicine

## 2017-10-11 ENCOUNTER — Telehealth: Payer: Self-pay | Admitting: Family Medicine

## 2017-10-11 NOTE — Telephone Encounter (Signed)
[facility], they are still giving patient lorazepam 0.5 1/2 bid.  I called they did find order with you signature but it was never updated? Also they state they do not use [facility] and that's why it was not ever updated in the [facility].  The Director can be reached at [phone]

## 2017-10-11 NOTE — Telephone Encounter (Signed)
New orders to fax to [facility] and patient's residential facility:  1.  Discontinue any current orders for lorazepam (Ativan)  2.  Given 0.25 mg of lorazepam by mouth at bedtime for two weeks (14 nights), then STOP  ____________________________________ Jennifer Klein, M.D.  /       Date  Current Outpatient Prescriptions:  .  atorvastatin (LIPITOR) 20 MG tablet, TAKE 1 TABLET BY MOUTH AT BEDTIME, Disp: 30 tablet, Rfl: 5 .  cholecalciferol (VITAMIN D) 1000 units tablet, Take 1 tablet (1,000 Units total) by mouth daily., Disp: 30 tablet, Rfl: 5 .  donepezil (ARICEPT) 10 MG tablet, TAKE 1 TABLET BY MOUTH BEDTIME, Disp: 30 tablet, Rfl: 11 .  LORazepam (ATIVAN) 0.5 MG tablet, Take 0.25 mg by mouth at bedtime. For two weeks, then STOP *Discontinue all other current or prior lorazepam orders*, Disp: , Rfl:  .  memantine (NAMENDA) 10 MG tablet, TAKE 1 TABLET BY MOUTH TWICE DAILY, Disp: 60 tablet, Rfl: 11 .  metoprolol tartrate (LOPRESSOR) 25 MG tablet, Take 1 tablet (25 mg total) by mouth 2 (two) times daily., Disp: 60 tablet, Rfl: 5 .  pantoprazole (PROTONIX) 40 MG tablet, Take 1 tablet (40 mg total) by mouth daily., Disp: 30 tablet, Rfl: 5 .  Rivaroxaban (XARELTO) 15 MG TABS tablet, Take 1 tablet (15 mg total) by mouth daily with supper., Disp: 30 tablet, Rfl: 5 .  ULORIC 40 MG tablet, TAKE 1 TABLET BY MOUTH ONCE DAILY FOR GOUT, Disp: 30 tablet, Rfl: 2

## 2017-10-11 NOTE — Telephone Encounter (Signed)
Lorazepam Rx requested a full month early I prescribed a two month supply of this controlled substance on 09/10/2017 She should not be out yet If patient has still been receiving the lorazepam BID, I would like to speak to the nursing manager or facility director please  Meds ordered this encounter  Medications  . LORazepam (ATIVAN) 0.5 MG tablet    Sig: Take 0.5 tablets (0.25 mg total) by mouth at bedtime. (new instructions)    Dispense:  15 tablet    Refill:  1    Discontinue current lorazepam orders; use this instead ONLY

## 2017-10-11 NOTE — Telephone Encounter (Signed)
I gather from above that they received my order and did not update the [facility] or own it to have it changed with a doctor's order I called to speak to the director, Vicki Mallet to ensure this doesn't happen again and see what steps can be taken for my future orders and to get new orders taken care of correctly I left a message on Ms. Boone's identified voicemail asking for her to contact me to make sure orders going forward are followed ----------------------------------------------------- I reviewed the [place] web site and the last prescription filled was on 08/24/2017 for 30 lorazepam That Rx was written on 07/26/2017 The Rx that was written by me on 09/10/2017 does NOT show up in the [employer] site data base It appears that this prescription has not been filled according to the [employer],  Please contact [facility] and verify this with them; find out what happened to the Rx written on 09/10/17

## 2017-10-11 NOTE — Telephone Encounter (Signed)
Ok so I called back they are supposed to be sending the documentation form from her last visit.  Jennifer Klein I guess a med tech stated that the patient does use [facility].  But others use a pharmacy through them and it automatically changes their MAR, but for her they have to send to get it updated when new med is filled and I guess someone forgot to send and update.

## 2017-10-11 NOTE — Telephone Encounter (Signed)
I am writing new orders for the lorazepam There should NOT be a need for any additional lorazepam pills, as [facility] says they dispensed pills on 10/09/17 (Saturday) I'll close note out and await call from director, but I'm going to just wean her off of this medicine completely

## 2017-10-12 NOTE — Telephone Encounter (Signed)
Faxed to [facility]

## 2017-10-14 ENCOUNTER — Telehealth: Payer: Self-pay

## 2017-10-14 NOTE — Telephone Encounter (Signed)
This encounter was created in error - please disregard.

## 2017-10-14 NOTE — Telephone Encounter (Signed)
Ms. Jennifer Klein called Dr. Sanda Klein back about medications.

## 2017-10-15 ENCOUNTER — Encounter: Payer: Self-pay | Admitting: *Deleted

## 2017-10-15 ENCOUNTER — Ambulatory Visit: Payer: Medicare Other | Admitting: Family Medicine

## 2017-10-15 NOTE — Telephone Encounter (Signed)
I returned her call Left message; she can return my call if desired (I have already decided to taper patient off of the medicine, so her individual use of benzos will soon be a moot point)

## 2017-10-15 NOTE — Telephone Encounter (Signed)
This encounter was created in error - please disregard.

## 2017-10-15 NOTE — Telephone Encounter (Signed)
Call to Ms. Jennifer Klein- left message- to verify that she received message from Dr Sanda Klein- call back if she did not or has questions.

## 2017-10-16 ENCOUNTER — Emergency Department
Admission: EM | Admit: 2017-10-16 | Discharge: 2017-10-16 | Disposition: A | Discharge status: Home / Self Care | Payer: Medicare Other | Attending: Emergency Medicine | Admitting: Emergency Medicine

## 2017-10-16 ENCOUNTER — Emergency Department: Payer: Medicare Other

## 2017-10-16 ENCOUNTER — Encounter: Payer: Self-pay | Admitting: Emergency Medicine

## 2017-10-16 DIAGNOSIS — I129 Hypertensive chronic kidney disease with stage 1 through stage 4 chronic kidney disease, or unspecified chronic kidney disease: Secondary | ICD-10-CM | POA: Diagnosis not present

## 2017-10-16 DIAGNOSIS — G309 Alzheimer's disease, unspecified: Secondary | ICD-10-CM | POA: Diagnosis not present

## 2017-10-16 DIAGNOSIS — H1131 Conjunctival hemorrhage, right eye: Secondary | ICD-10-CM | POA: Insufficient documentation

## 2017-10-16 DIAGNOSIS — Z7901 Long term (current) use of anticoagulants: Secondary | ICD-10-CM | POA: Diagnosis not present

## 2017-10-16 DIAGNOSIS — N183 Chronic kidney disease, stage 3 (moderate): Secondary | ICD-10-CM | POA: Diagnosis not present

## 2017-10-16 DIAGNOSIS — R4182 Altered mental status, unspecified: Secondary | ICD-10-CM | POA: Diagnosis not present

## 2017-10-16 DIAGNOSIS — F0280 Dementia in other diseases classified elsewhere without behavioral disturbance: Secondary | ICD-10-CM | POA: Insufficient documentation

## 2017-10-16 DIAGNOSIS — Z79899 Other long term (current) drug therapy: Secondary | ICD-10-CM | POA: Insufficient documentation

## 2017-10-16 DIAGNOSIS — H5711 Ocular pain, right eye: Secondary | ICD-10-CM | POA: Diagnosis not present

## 2017-10-16 DIAGNOSIS — R4020 Unspecified coma: Secondary | ICD-10-CM | POA: Diagnosis not present

## 2017-10-16 DIAGNOSIS — Z743 Need for continuous supervision: Secondary | ICD-10-CM | POA: Diagnosis not present

## 2017-10-16 DIAGNOSIS — Z87891 Personal history of nicotine dependence: Secondary | ICD-10-CM | POA: Diagnosis not present

## 2017-10-16 LAB — PROTIME-INR
INR: 1.22
Prothrombin Time: 15.3 seconds — ABNORMAL HIGH (ref 11.4–15.2)

## 2017-10-16 LAB — CBC WITH DIFFERENTIAL/PLATELET
BASOS PCT: 1 %
Basophils Absolute: 0 10*3/uL (ref 0–0.1)
Eosinophils Absolute: 0.2 10*3/uL (ref 0–0.7)
Eosinophils Relative: 3 %
HEMATOCRIT: 34.4 % — AB (ref 35.0–47.0)
HEMOGLOBIN: 11.6 g/dL — AB (ref 12.0–16.0)
LYMPHS ABS: 1.2 10*3/uL (ref 1.0–3.6)
LYMPHS PCT: 16 %
MCH: 30.5 pg (ref 26.0–34.0)
MCHC: 33.8 g/dL (ref 32.0–36.0)
MCV: 90.2 fL (ref 80.0–100.0)
MONO ABS: 0.6 10*3/uL (ref 0.2–0.9)
MONOS PCT: 9 %
NEUTROS ABS: 5.3 10*3/uL (ref 1.4–6.5)
NEUTROS PCT: 71 %
Platelets: 238 10*3/uL (ref 150–440)
RBC: 3.81 MIL/uL (ref 3.80–5.20)
RDW: 14.1 % (ref 11.5–14.5)
WBC: 7.3 10*3/uL (ref 3.6–11.0)

## 2017-10-16 NOTE — ED Provider Notes (Signed)
Insert H&P  [facility] Emergency Department Provider Note  ____________________________________________   I have reviewed the triage vital signs and the nursing notes.   HISTORY  Chief Complaint Eye Problem    HPI Jennifer Klein is a 76 y.o. female  vision here with a history of Alzheimer's disease and poor memory a history of falls in the past, on Xarelto, presents today with a painless sub-conjunctival hematoma on the right.  Denies any visual changes or trauma.  She is at her baseline according to EMS.  According to notes from nursing home, patient has had asymptomatic subconjunctival hematoma for a few days.    Past Medical History:  Diagnosis Date  . Alzheimer's dementia with behavioral disturbance 05/04/2016  . Alzheimer's dementia without behavioral disturbance 05/04/2016  . B12 deficiency 05/06/2016  . Gout   . Hemorrhoids   . Hyperlipidemia LDL goal <100 05/05/2016  . Hypertension   . Memory loss   . PAF (paroxysmal atrial fibrillation) ([place])    a. on Xarelto; b. 48-hr Holter 2014: NSR with PAF/flutter which happened at least 2.26% of the time w/ associated tachycardia. Patient was asymptomatic; c. CHADS2VASc => 4 (HTN, age x 2, female)  . Paroxysmal atrial fibrillation ([place]) 08/07/2013  . Senile osteoporosis   . Systolic dysfunction    a. echo 08/2013: EF 50-55%, mild MR, mildly dilated LA    Patient Active Problem List   Diagnosis Date Noted  . Fall 09/10/2017  . PAD (peripheral artery disease) ([place]) 06/22/2017  . Chronic venous insufficiency 06/22/2017  . CKD (chronic kidney disease) stage 3, GFR 30-59 ml/min (HCC) 09/04/2016  . B12 deficiency 05/06/2016  . Hyperlipidemia LDL goal <100 05/05/2016  . Alzheimer's dementia without behavioral disturbance 05/04/2016  . Medication monitoring encounter 05/04/2016  . Barrett's esophagus 11/09/2015  . Paroxysmal atrial fibrillation ([place]) 08/07/2013  . Osteopenia 08/08/2012  . Hypertension  06/03/2012  . Gout 06/03/2012    Past Surgical History:  Procedure Laterality Date  . TUBAL LIGATION      Prior to Admission medications   Medication Sig Start Date End Date Taking? Authorizing Provider  atorvastatin (LIPITOR) 20 MG tablet TAKE 1 TABLET BY MOUTH AT BEDTIME 04/09/17   Lada, Satira Anis, MD  cholecalciferol (VITAMIN D) 1000 units tablet Take 1 tablet (1,000 Units total) by mouth daily. 08/13/17   Lada, Satira Anis, MD  donepezil (ARICEPT) 10 MG tablet TAKE 1 TABLET BY MOUTH BEDTIME 08/04/17   Lada, Satira Anis, MD  LORazepam (ATIVAN) 0.5 MG tablet Take 0.25 mg by mouth at bedtime. For two weeks, then STOP *Discontinue all other current or prior lorazepam orders*    [provider]  memantine (NAMENDA) 10 MG tablet TAKE 1 TABLET BY MOUTH TWICE DAILY 08/11/17   Lada, Satira Anis, MD  metoprolol tartrate (LOPRESSOR) 25 MG tablet Take 1 tablet (25 mg total) by mouth 2 (two) times daily. 04/06/17 09/24/17  Wellington Hampshire, MD  pantoprazole (PROTONIX) 40 MG tablet Take 1 tablet (40 mg total) by mouth daily. 07/26/17   Arnetha Courser, MD  Rivaroxaban (XARELTO) 15 MG TABS tablet Take 1 tablet (15 mg total) by mouth daily with supper. 08/13/17   Lada, Satira Anis, MD  ULORIC 40 MG tablet TAKE 1 TABLET BY MOUTH ONCE DAILY FOR GOUT 10/05/17   Lada, Satira Anis, MD    Allergies Darvon [propoxyphene hcl]  Family History  Problem Relation Age of Onset  . Heart disease Father   . Gout Brother  Social History Social History  Substance Use Topics  . Smoking status: Former Smoker    Packs/day: 0.25    Years: 1.00    Types: Cigarettes  . Smokeless tobacco: Never Used  . Alcohol use No    Review of Systems Constitutional: No fever/chills Eyes: No visual changes. ENT: No sore throat. No stiff neck no neck pain Cardiovascular: Denies chest pain. Respiratory: Denies shortness of breath. Gastrointestinal:   no vomiting.  No diarrhea.  No constipation. Genitourinary: Negative for  dysuria. Musculoskeletal: Negative lower extremity swelling Skin: Negative for rash. Neurological: Negative for severe headaches, focal weakness or numbness.   ____________________________________________   PHYSICAL EXAM:  VITAL SIGNS: ED Triage Vitals [10/16/17 1554]  Enc Vitals Group     BP 134/78     Pulse Rate 83     Resp 18     Temp 98.6 F (37 C)     Temp Source Oral     SpO2 99 %     Weight 148 lb (67.1 kg)     Height 5\' 1"  (1.549 m)     Head Circumference      Peak Flow      Pain Score      Pain Loc      Pain Edu?      Excl. in [place]?     Constitutional: Alert and oriented to name and place unsure of the date pleasantly demented. Well appearing and in no acute distress. Eyes: There is a some conjunctival hematoma on the right, pupil however is round and reactive there is no evidence of hyphema, she has no pain with ranging the eye there is no evidence of trauma to the eye itself, there is no tenderness to palpation around the eye or in the temporal region Head: Atraumatic evidence of bruising or recent fall HEENT: No congestion/rhinnorhea. Mucous membranes are moist.  Oropharynx non-erythematous Neck:   Nontender with no meningismus, no masses, no stridor Cardiovascular: Normal rate, regular rhythm. Grossly normal heart sounds.  Good peripheral circulation. Respiratory: Normal respiratory effort.  No retractions. Lungs CTAB. Abdominal: Soft and nontender. No distention. No guarding no rebound Back:  There is no focal tenderness or step off.  there is no midline tenderness there are no lesions noted. there is no CVA tenderness Musculoskeletal: No lower extremity tenderness, no upper extremity tenderness. No joint effusions, no DVT signs strong distal pulses no edema Neurologic:  Normal speech and language. No gross focal neurologic deficits are appreciated.  Skin:  Skin is warm, dry and intact. No rash noted. Psychiatric: Mood and affect are normal. Speech and behavior  are normal.  ____________________________________________   LABS (all labs ordered are listed, but only abnormal results are displayed)  Labs Reviewed  CBC WITH DIFFERENTIAL/PLATELET - Abnormal; Notable for the following:       Result Value   Hemoglobin 11.6 (*)    HCT 34.4 (*)    All other components within normal limits  PROTIME-INR    Pertinent labs  results that were available during my care of the patient were reviewed by me and considered in my medical decision making (see chart for details). ____________________________________________  EKG  I personally interpreted any EKGs ordered by me or triage  ____________________________________________  RADIOLOGY  Pertinent labs & imaging results that were available during my care of the patient were reviewed by me and considered in my medical decision making (see chart for details). If possible, patient and/or family made aware of any abnormal findings. ____________________________________________  PROCEDURES  Procedure(s) performed: None  Procedures  Critical Care performed: None  ____________________________________________   INITIAL IMPRESSION / ASSESSMENT AND PLAN / ED COURSE  Pertinent labs & imaging results that were available during my care of the patient were reviewed by me and considered in my medical decision making (see chart for details).  Here with a asymptomatic son conjunctival hematoma from a nursing home.  She does have a history of unremarkable active falls obtain a CT scan of her head as a precaution as this can be a result of an recollected trauma and she is at risk for bleeding and given her dementia is very hard to get a baseline for her, we will check and make sure she does not have thrombus cytopenia or other reason to have increased bleeding at that is all reassuring I think she can safely go home with outpatient ophthalmologic follow-up for a asymptomatic sub-conjunctival hematoma without  evidence of hyphema or trauma or ocular pathology    ____________________________________________   FINAL CLINICAL IMPRESSION(S) / ED DIAGNOSES  Final diagnoses:  None      This chart was dictated using voice recognition software.  Despite best efforts to proofread,  errors can occur which can change meaning.      Schuyler Amor, MD 10/16/17 [phone]

## 2017-10-16 NOTE — ED Notes (Signed)
Pt in ct. Pt is pleasantly confused - unsure of why she is here, even though told twice now why she is here. Denies pain.

## 2017-10-16 NOTE — ED Triage Notes (Signed)
Pt arrived via EMS from [facility] for right eye redness for a few days. Per EMS son was not concerned a few days ago but staff report is worsening today.  Son was not present, but told staff to send pt if they felt it was worse. Pt has hx of dementia

## 2017-10-18 DIAGNOSIS — H1131 Conjunctival hemorrhage, right eye: Secondary | ICD-10-CM | POA: Diagnosis not present

## 2017-10-27 ENCOUNTER — Other Ambulatory Visit: Payer: Self-pay | Admitting: Family Medicine

## 2017-10-27 NOTE — Telephone Encounter (Signed)
Last sgpt reviewed; Rx approved 

## 2017-10-28 ENCOUNTER — Telehealth: Payer: Self-pay | Admitting: Family Medicine

## 2017-10-28 NOTE — Telephone Encounter (Addendum)
Copied from [place]. Topic: Quick Communication - See Telephone Encounter >> Oct 28, 2017 10:38 AM Ivar Drape wrote: CRM for notification. See Telephone encounter for:  10/28/17.  Jennifer from [facility] in the [place]. needs the PCP to call her about the patient's increase agitation and combativeness. She can be reached at [phone].

## 2017-10-29 ENCOUNTER — Encounter: Payer: Self-pay | Admitting: Family Medicine

## 2017-10-29 MED ORDER — BUSPIRONE HCL 5 MG PO TABS: 5.0000 mg | ORAL_TABLET | Freq: Two times a day (BID) | ORAL | 5 refills | Status: DC

## 2017-10-29 NOTE — Telephone Encounter (Signed)
I called to discuss her behavior Was on hold twice ----------------------------------------- Please call and let them know that I'll start buspirone twice a day orally for anxiety I do not want to restart a benzodiazepine there If symptoms not controlled, appointment here please Have them do a urinalysis TODAY for dip, micro, and culture to r/o bladder infection; dx behavior change Thank you

## 2017-10-29 NOTE — Telephone Encounter (Signed)
Called and orders faxed to [phone]

## 2017-11-04 ENCOUNTER — Other Ambulatory Visit: Payer: Self-pay | Admitting: Family Medicine

## 2017-11-04 NOTE — Telephone Encounter (Signed)
Called patient's  Facility, that stated she is no longer taking Benzo.

## 2017-11-04 NOTE — Telephone Encounter (Signed)
Patient should NOT be on a benzo at this time I am denying the refill request See previous orders and phone calls Please contact group home / facility to make sure they followed my orders If they did not, back to me so I can call the patient's husband please

## 2017-12-16 ENCOUNTER — Other Ambulatory Visit: Payer: Self-pay | Admitting: Family Medicine

## 2017-12-27 ENCOUNTER — Other Ambulatory Visit: Payer: Self-pay | Admitting: Family Medicine

## 2017-12-27 NOTE — Telephone Encounter (Signed)
Barrett's esophagus Rx approved

## 2018-01-12 ENCOUNTER — Ambulatory Visit (INDEPENDENT_AMBULATORY_CARE_PROVIDER_SITE_OTHER): Payer: Medicare Other | Admitting: Family Medicine

## 2018-01-12 ENCOUNTER — Emergency Department: Payer: Medicare Other

## 2018-01-12 ENCOUNTER — Emergency Department
Admission: EM | Admit: 2018-01-12 | Discharge: 2018-01-12 | Disposition: A | Discharge status: Home / Self Care | Payer: Medicare Other | Attending: Emergency Medicine | Admitting: Emergency Medicine

## 2018-01-12 ENCOUNTER — Other Ambulatory Visit: Payer: Self-pay

## 2018-01-12 ENCOUNTER — Encounter: Payer: Self-pay | Admitting: Family Medicine

## 2018-01-12 ENCOUNTER — Telehealth: Payer: Self-pay

## 2018-01-12 ENCOUNTER — Telehealth: Payer: Self-pay | Admitting: Family Medicine

## 2018-01-12 VITALS — BP 120/70 | HR 95 | Temp 98.1°F | Resp 14 | Ht 61.0 in | Wt 163.1 lb

## 2018-01-12 DIAGNOSIS — F0280 Dementia in other diseases classified elsewhere without behavioral disturbance: Secondary | ICD-10-CM | POA: Insufficient documentation

## 2018-01-12 DIAGNOSIS — N183 Chronic kidney disease, stage 3 (moderate): Secondary | ICD-10-CM | POA: Diagnosis not present

## 2018-01-12 DIAGNOSIS — G309 Alzheimer's disease, unspecified: Secondary | ICD-10-CM | POA: Diagnosis not present

## 2018-01-12 DIAGNOSIS — Z79899 Other long term (current) drug therapy: Secondary | ICD-10-CM | POA: Insufficient documentation

## 2018-01-12 DIAGNOSIS — I10 Essential (primary) hypertension: Secondary | ICD-10-CM

## 2018-01-12 DIAGNOSIS — Z7901 Long term (current) use of anticoagulants: Secondary | ICD-10-CM | POA: Insufficient documentation

## 2018-01-12 DIAGNOSIS — R4182 Altered mental status, unspecified: Secondary | ICD-10-CM | POA: Diagnosis not present

## 2018-01-12 DIAGNOSIS — I129 Hypertensive chronic kidney disease with stage 1 through stage 4 chronic kidney disease, or unspecified chronic kidney disease: Secondary | ICD-10-CM | POA: Diagnosis not present

## 2018-01-12 DIAGNOSIS — Z87891 Personal history of nicotine dependence: Secondary | ICD-10-CM | POA: Insufficient documentation

## 2018-01-12 DIAGNOSIS — R5383 Other fatigue: Secondary | ICD-10-CM | POA: Diagnosis present

## 2018-01-12 DIAGNOSIS — R400 Somnolence: Secondary | ICD-10-CM | POA: Insufficient documentation

## 2018-01-12 LAB — BASIC METABOLIC PANEL
ANION GAP: 7 (ref 5–15)
BUN: 27 mg/dL — ABNORMAL HIGH (ref 6–20)
CO2: 28 mmol/L (ref 22–32)
Calcium: 8.5 mg/dL — ABNORMAL LOW (ref 8.9–10.3)
Chloride: 110 mmol/L (ref 101–111)
Creatinine, Ser: 0.98 mg/dL (ref 0.44–1.00)
GFR calc non Af Amer: 54 mL/min — ABNORMAL LOW (ref >60)
Glucose, Bld: 117 mg/dL — ABNORMAL HIGH (ref 65–99)
POTASSIUM: 3.8 mmol/L (ref 3.5–5.1)
Sodium: 145 mmol/L (ref 135–145)

## 2018-01-12 LAB — URINALYSIS, COMPLETE (UACMP) WITH MICROSCOPIC
BACTERIA UA: NONE SEEN
Bilirubin Urine: NEGATIVE
GLUCOSE, UA: NEGATIVE mg/dL
HGB URINE DIPSTICK: NEGATIVE
KETONES UR: NEGATIVE mg/dL
Leukocytes, UA: NEGATIVE
NITRITE: NEGATIVE
PROTEIN: NEGATIVE mg/dL
Specific Gravity, Urine: 1.027 (ref 1.005–1.030)
pH: 5 (ref 5.0–8.0)

## 2018-01-12 LAB — CBC
HEMATOCRIT: 38.9 % (ref 35.0–47.0)
HEMOGLOBIN: 12.7 g/dL (ref 12.0–16.0)
MCH: 29 pg (ref 26.0–34.0)
MCHC: 32.7 g/dL (ref 32.0–36.0)
MCV: 88.9 fL (ref 80.0–100.0)
Platelets: 311 10*3/uL (ref 150–440)
RBC: 4.38 MIL/uL (ref 3.80–5.20)
RDW: 14.8 % — ABNORMAL HIGH (ref 11.5–14.5)
WBC: 8.5 10*3/uL (ref 3.6–11.0)

## 2018-01-12 NOTE — Progress Notes (Signed)
Name: Jennifer Klein   MRN: [id number]    DOB: 09/06/1941   Date:01/12/2018       Progress Note  Subjective  Chief Complaint  Chief Complaint  Patient presents with  . Urinary Tract Infection    HPI  Pt presents with her husband from [place] care unit with concern for elevated BP, not feeling well, and concern for UTI. Per notes from today, Anderson Malta from [facility] noted BP of 169/103 and pulse of 99 with some behavior changes; Dr. Sanda Klein, the patient's PCP reviewed this information and recommended pt present for emergency care.  Husband came to [place] today and picked pt up and brought her in for an appointment here - he states [facility] [employer] never told him to go to the ER.  Husband states that he does not want to take her to the ER for evaluation at this time, he feels that she is acting normally and would simply like to have her urine tested.  Spoke with Caryl Ada at [facility], states she was not there earlier in the day and did not know that patient was advised to go to the ER.  She states that she knows the patient was acting "groggy and out of it" with a high blood pressure, so her husband picked her up to take her to the doctor's office.  There is no staff available from day shift to provide background.  Pt has Alzheimer's dementia and is unable to provide additional history.  Pt has HTN which appears to be at goal today; paroxysmal afib for which she takes Xarelto and metoprolol.   Patient Active Problem List   Diagnosis Date Noted  . Fall 09/10/2017  . PAD (peripheral artery disease) ([place]) 06/22/2017  . Chronic venous insufficiency 06/22/2017  . CKD (chronic kidney disease) stage 3, GFR 30-59 ml/min (HCC) 09/04/2016  . B12 deficiency 05/06/2016  . Hyperlipidemia LDL goal <100 05/05/2016  . Alzheimer's dementia without behavioral disturbance 05/04/2016  . Medication monitoring encounter 05/04/2016  . Barrett's esophagus 11/09/2015  . Paroxysmal atrial  fibrillation ([place]) 08/07/2013  . Osteopenia 08/08/2012  . Hypertension 06/03/2012  . Gout 06/03/2012    Social History   Tobacco Use  . Smoking status: Former Smoker    Packs/day: 0.25    Years: 1.00    Pack years: 0.25    Types: Cigarettes  . Smokeless tobacco: Never Used  Substance Use Topics  . Alcohol use: No    Alcohol/week: 0.0 oz     Current Outpatient Medications:  .  atorvastatin (LIPITOR) 20 MG tablet, TAKE 1 TABLET BY MOUTH AT BEDTIME, Disp: 30 tablet, Rfl: 5 .  busPIRone (BUSPAR) 5 MG tablet, Take 1 tablet (5 mg total) 2 (two) times daily by mouth., Disp: 60 tablet, Rfl: 5 .  cholecalciferol (VITAMIN D) 1000 units tablet, Take 1 tablet (1,000 Units total) by mouth daily., Disp: 30 tablet, Rfl: 5 .  donepezil (ARICEPT) 10 MG tablet, TAKE 1 TABLET BY MOUTH BEDTIME, Disp: 30 tablet, Rfl: 11 .  memantine (NAMENDA) 10 MG tablet, TAKE 1 TABLET BY MOUTH TWICE DAILY, Disp: 60 tablet, Rfl: 11 .  pantoprazole (PROTONIX) 40 MG tablet, TAKE 1 TABLET BY MOUTH DAILY, Disp: 30 tablet, Rfl: 5 .  Rivaroxaban (XARELTO) 15 MG TABS tablet, Take 1 tablet (15 mg total) by mouth daily with supper., Disp: 30 tablet, Rfl: 5 .  ULORIC 40 MG tablet, TAKE 1 TABLET BY MOUTH ONCE DAILY FOR GOUT, Disp: 30 tablet, Rfl: 2 .  metoprolol tartrate (LOPRESSOR) 25 MG tablet, Take 1 tablet (25 mg total) by mouth 2 (two) times daily., Disp: 60 tablet, Rfl: 5  Allergies  Allergen Reactions  . Darvon [Propoxyphene Hcl]     As a child    ROS Ten systems reviewed and is negative except as mentioned in HPI  Objective  Vitals:   01/12/18 1532  BP: 120/70  Pulse: 95  Resp: 14  Temp: 98.1 F (36.7 C)  TempSrc: Oral  SpO2: 96%  Weight: 163 lb 1.6 oz (74 kg)  Height: 5\' 1"  (1.549 m)   Body mass index is 30.82 kg/m.  Nursing Note and Vital Signs reviewed.  Physical Exam  Constitutional: Patient appears well-developed and well-nourished. Obese.  No distress.  HEENT: head atraumatic,  normocephalic Cardiovascular: Normal rate, regular rhythm, S1/S2 present.  No murmur or rub heard. No BLE edema. Pulmonary/Chest: Effort normal and breath sounds clear. No respiratory distress or retractions. Abdominal: Soft and non-tender, bowel sounds present x4 quadrants.  No CVA Tenderness. Psychiatric: Patient answers yes and no questions - unsure of her ability to understand questions being asked, affect is quite flat, however she is alert and ambulatory in the office. Neurological: she is alert, occasionally answers yes and no questions - speech is clear when she does answer; moderately unsteady gait but able to ambulate in office with husband's assistance. Skin: Skin is warm and dry. No rash noted. No erythema.  Psychiatric: Patient has a normal mood and affect. behavior is normal. Judgment and thought content normal.   No results found for this or any previous visit (from the past 72 hour(s)).  Assessment & Plan  1. Altered mental status, unspecified altered mental status type 2. Alzheimer's dementia without behavioral disturbance, unspecified timing of dementia onset 3. Essential hypertension - Due to PCP Dr. Delight Ovens recommendation to present for emergency care, along with information collected from caregivers at [facility], I am concerned for altered mental status that could be related to a number of possible etiologies.  Unfortunately patient is a poor historian and unable to discuss her symptoms.  Advised pt's husband that she should present for emergency care at [facility] as directed by Dr. Sanda Klein earlier in the day to better evaluate her symptoms.  After lengthy discussion, he agrees to this, declines EMS transport, and will take pt by private vehicle.  Report is called to [facility] ER nurse first.

## 2018-01-12 NOTE — Telephone Encounter (Signed)
Routing this to you as you are out of office and I do not feel comfortable giving a verbal order for UA when the last note in the chart states that pt was supposed to go to ED. Please advise. Thanks

## 2018-01-12 NOTE — ED Provider Notes (Signed)
[facility] Emergency Department Provider Note  Time seen: 7:40 PM  I have reviewed the triage vital signs and the nursing notes.   HISTORY  Chief Complaint Hypertension and Fatigue    HPI Jennifer Klein is a 77 y.o. female with a past medical history of dementia, hypertension, hyperlipidemia, paroxysmal atrial fibrillation, presents to the emergency department for increased lethargy.  According to the husband he was called to the nursing home today because of increased lethargy.  He states when he arrived the patient was acting normal to him.  He brought the patient to her primary care doctor however they were not able to see her so a physician assistant saw her and recommended to go to the emergency department for evaluation.  Here the patient appears well, she is awake alert, she is confused but the husband states this is baseline for her.  She cannot contribute to her history at this time.   Past Medical History:  Diagnosis Date  . Alzheimer's dementia with behavioral disturbance 05/04/2016  . Alzheimer's dementia without behavioral disturbance 05/04/2016  . B12 deficiency 05/06/2016  . Gout   . Hemorrhoids   . Hyperlipidemia LDL goal <100 05/05/2016  . Hypertension   . Memory loss   . PAF (paroxysmal atrial fibrillation) ([place])    a. on Xarelto; b. 48-hr Holter 2014: NSR with PAF/flutter which happened at least 2.26% of the time w/ associated tachycardia. Patient was asymptomatic; c. CHADS2VASc => 4 (HTN, age x 2, female)  . Paroxysmal atrial fibrillation ([place]) 08/07/2013  . Senile osteoporosis   . Systolic dysfunction    a. echo 08/2013: EF 50-55%, mild MR, mildly dilated LA    Patient Active Problem List   Diagnosis Date Noted  . Fall 09/10/2017  . PAD (peripheral artery disease) ([place]) 06/22/2017  . Chronic venous insufficiency 06/22/2017  . CKD (chronic kidney disease) stage 3, GFR 30-59 ml/min (HCC) 09/04/2016  . B12 deficiency 05/06/2016  .  Hyperlipidemia LDL goal <100 05/05/2016  . Alzheimer's dementia without behavioral disturbance 05/04/2016  . Medication monitoring encounter 05/04/2016  . Barrett's esophagus 11/09/2015  . Paroxysmal atrial fibrillation ([place]) 08/07/2013  . Osteopenia 08/08/2012  . Hypertension 06/03/2012  . Gout 06/03/2012    Past Surgical History:  Procedure Laterality Date  . TUBAL LIGATION      Prior to Admission medications   Medication Sig Start Date End Date Taking? Authorizing Provider  atorvastatin (LIPITOR) 20 MG tablet TAKE 1 TABLET BY MOUTH AT BEDTIME 10/27/17   Lada, Satira Anis, MD  busPIRone (BUSPAR) 5 MG tablet Take 1 tablet (5 mg total) 2 (two) times daily by mouth. 10/29/17   Arnetha Courser, MD  cholecalciferol (VITAMIN D) 1000 units tablet Take 1 tablet (1,000 Units total) by mouth daily. 08/13/17   Arnetha Courser, MD  donepezil (ARICEPT) 10 MG tablet TAKE 1 TABLET BY MOUTH BEDTIME 08/04/17   Lada, Satira Anis, MD  memantine (NAMENDA) 10 MG tablet TAKE 1 TABLET BY MOUTH TWICE DAILY 08/11/17   Arnetha Courser, MD  metoprolol tartrate (LOPRESSOR) 25 MG tablet Take 1 tablet (25 mg total) by mouth 2 (two) times daily. 04/06/17 09/24/17  Wellington Hampshire, MD  pantoprazole (PROTONIX) 40 MG tablet TAKE 1 TABLET BY MOUTH DAILY 12/27/17   Arnetha Courser, MD  Rivaroxaban (XARELTO) 15 MG TABS tablet Take 1 tablet (15 mg total) by mouth daily with supper. 08/13/17   Arnetha Courser, MD  ULORIC 40 MG tablet TAKE 1 TABLET  BY MOUTH ONCE DAILY FOR GOUT 12/16/17   Arnetha Courser, MD    Allergies  Allergen Reactions  . Darvon [Propoxyphene Hcl]     As a child    Family History  Problem Relation Age of Onset  . Heart disease Father   . Gout Brother     Social History Social History   Tobacco Use  . Smoking status: Former Smoker    Packs/day: 0.25    Years: 1.00    Pack years: 0.25    Types: Cigarettes  . Smokeless tobacco: Never Used  Substance Use Topics  . Alcohol use: No    Alcohol/week:  0.0 oz  . Drug use: No    Review of Systems Unable to obtain an adequate/accurate review of systems due to baseline dementia  ____________________________________________   PHYSICAL EXAM:  VITAL SIGNS: ED Triage Vitals [01/12/18 1639]  Enc Vitals Group     BP (!) 165/96     Pulse Rate (!) 104     Resp 15     Temp 98.4 F (36.9 C)     Temp Source Oral     SpO2 98 %     Weight 163 lb (73.9 kg)     Height 5\' 2"  (1.575 m)     Head Circumference      Peak Flow      Pain Score      Pain Loc      Pain Edu?      Excl. in [place]?     Constitutional: Alert, well-appearing, calm and comfortable, no distress. Eyes: Normal exam ENT   Head: Normocephalic and atraumatic   Mouth/Throat: Mucous membranes are moist. Cardiovascular: Normal rate, regular rhythm.  Respiratory: Normal respiratory effort without tachypnea nor retractions. Breath sounds are clear  Gastrointestinal: Soft and nontender. No distention.   Musculoskeletal: Nontender with normal range of motion in all extremities. Neurologic:  Normal speech and language. No gross focal neurologic deficits  Skin:  Skin is warm, dry and intact.  Psychiatric: Mood and affect are normal.  ____________________________________________    RADIOLOGY  Chest x-ray normal  ____________________________________________   INITIAL IMPRESSION / ASSESSMENT AND PLAN / ED COURSE  Pertinent labs & imaging results that were available during my care of the patient were reviewed by me and considered in my medical decision making (see chart for details).  Patient presents to the emergency department for increased lethargy.  Differential would include electrolyte or metabolic abnormality, infectious etiology.  Husband is here with the patient states she has been acting normal to him all day including the car ride over to her doctor, the car ride over here, the whole time she has been here.  Patient's workup is normal.  Urinalysis is normal,  labs including white blood cell count are normal.  Chest x-ray is normal.  At this time as the patient's medical workup is well-appearing, physical exam is well-appearing, vitals are reassuring and the husband states she is acting at her baseline I believe the patient is safe for discharge back to her nursing facility.  Has been agreeable to this plan of care and plans to transport her back personally.  ____________________________________________   FINAL CLINICAL IMPRESSION(S) / ED DIAGNOSES  Altered mental status, resolved    Harvest Dark, MD 01/12/18 1944

## 2018-01-12 NOTE — Telephone Encounter (Signed)
Agree, patient was supposed to go to ER If husband absolutely refuses, then okay for UA with culture  Thank you

## 2018-01-12 NOTE — Telephone Encounter (Signed)
Copied from [place]. Topic: General - Other >> Jan 12, 2018 12:26 PM Lolita Rieger, RMA wrote: Reason for CRM: [facility] called back ans would like an order for a UA please call [phone]

## 2018-01-12 NOTE — ED Triage Notes (Signed)
Pt spouse reports that today pt had decreased energy and hypertension - [facility] requested that she come to the ED for f/u and to eval for UTI

## 2018-01-12 NOTE — Telephone Encounter (Signed)
Spoke to Dr. Sanda Klein regarding this she states to send pt to ER she may have bad bladder/kidney infection.  Anderson Malta notified at [facility].    Copied from [place] [phone]. Topic: General - Other >> Jan 12, 2018  8:47 AM Carolyn Stare wrote:  Anderson Malta with [facility] call to say pt is not really acting like herself and she checked pt bp and it 169/103 pulse 99 and would like a call back as to what to do   [phone] memory care dept

## 2018-01-13 NOTE — Telephone Encounter (Signed)
Pt seen in office, see encounter for documentation.

## 2018-01-17 ENCOUNTER — Other Ambulatory Visit: Payer: Self-pay | Admitting: Family Medicine

## 2018-01-17 ENCOUNTER — Other Ambulatory Visit: Payer: Self-pay | Admitting: Cardiovascular Disease

## 2018-01-27 ENCOUNTER — Encounter: Payer: Self-pay | Admitting: Family Medicine

## 2018-01-27 ENCOUNTER — Ambulatory Visit (INDEPENDENT_AMBULATORY_CARE_PROVIDER_SITE_OTHER): Payer: Medicare Other | Admitting: Family Medicine

## 2018-01-27 VITALS — BP 112/70 | HR 76 | Temp 97.9°F | Resp 14 | Wt 160.0 lb

## 2018-01-27 DIAGNOSIS — B372 Candidiasis of skin and nail: Secondary | ICD-10-CM

## 2018-01-27 DIAGNOSIS — G309 Alzheimer's disease, unspecified: Secondary | ICD-10-CM | POA: Diagnosis not present

## 2018-01-27 DIAGNOSIS — F0280 Dementia in other diseases classified elsewhere without behavioral disturbance: Secondary | ICD-10-CM

## 2018-01-27 MED ORDER — FLUCONAZOLE 150 MG PO TABS: 150.0000 mg | ORAL_TABLET | Freq: Once | ORAL | 0 refills | Status: AC

## 2018-01-27 MED ORDER — VITAMIN B-12 500 MCG SL SUBL: SUBLINGUAL_TABLET | SUBLINGUAL | 3 refills | Status: DC

## 2018-01-27 NOTE — Patient Instructions (Signed)
Start the vitamin B12 and see if that helps at all Have her take diflucan just one time by mouth Let her air out a little bit after baths and diaper changes

## 2018-01-27 NOTE — Assessment & Plan Note (Signed)
Offered referral to neurologist; husband declined; will add B12; continue two meds

## 2018-01-27 NOTE — Progress Notes (Signed)
BP 112/70   Pulse 76   Temp 97.9 F (36.6 C) (Oral)   Resp 14   Wt 160 lb (72.6 kg)   SpO2 94%   BMI 29.26 kg/m    Subjective:    Patient ID: Jennifer Klein, female    DOB: [birth year], 77 y.o.   MRN: [id number]  HPI: NAYALI Klein is a 77 y.o. female  Chief Complaint  Patient presents with  . Vaginal Itching    per health care facility    HPI Husband here with patient She has been rubbing just a little No blood under fingernails; doing very well overall he says; offered referral to neurologist Chewing on nails now Dementia, mother had this too; younger brother had lymphoma, but he doesn't have dementia to his knowledge; her other brother died from car accident effects after a while  Depression screen [facility] 2/9 01/27/2018 09/10/2017 06/02/2017 05/11/2017 01/19/2017  Decreased Interest 0 0 0 0 0  Down, Depressed, Hopeless 0 0 0 0 0  PHQ - 2 Score 0 0 0 0 0  Altered sleeping - - - - -  Tired, decreased energy - - - - -  Change in appetite - - - - -  Feeling bad or failure about yourself  - - - - -  Trouble concentrating - - - - -  Moving slowly or fidgety/restless - - - - -  Suicidal thoughts - - - - -  PHQ-9 Score - - - - -    Relevant past medical, surgical, family and social history reviewed Past Medical History:  Diagnosis Date  . Alzheimer's dementia with behavioral disturbance 05/04/2016  . Alzheimer's dementia without behavioral disturbance 05/04/2016  . B12 deficiency 05/06/2016  . Gout   . Hemorrhoids   . Hyperlipidemia LDL goal <100 05/05/2016  . Hypertension   . Memory loss   . PAF (paroxysmal atrial fibrillation) ([place])    a. on Xarelto; b. 48-hr Holter 2014: NSR with PAF/flutter which happened at least 2.26% of the time w/ associated tachycardia. Patient was asymptomatic; c. CHADS2VASc => 4 (HTN, age x 2, female)  . Paroxysmal atrial fibrillation ([place]) 08/07/2013  . Senile osteoporosis   . Systolic dysfunction    a. echo 08/2013: EF 50-55%, mild MR, mildly  dilated LA   Past Surgical History:  Procedure Laterality Date  . TUBAL LIGATION     Family History  Problem Relation Age of Onset  . Heart disease Father   . Gout Brother    Social History   Tobacco Use  . Smoking status: Former Smoker    Packs/day: 0.25    Years: 1.00    Pack years: 0.25    Types: Cigarettes  . Smokeless tobacco: Never Used  Substance Use Topics  . Alcohol use: No    Alcohol/week: 0.0 oz  . Drug use: No    Interim medical history since last visit reviewed. Allergies and medications reviewed  Review of Systems Per HPI unless specifically indicated above     Objective:    BP 112/70   Pulse 76   Temp 97.9 F (36.6 C) (Oral)   Resp 14   Wt 160 lb (72.6 kg)   SpO2 94%   BMI 29.26 kg/m   Wt Readings from Last 3 Encounters:  01/27/18 160 lb (72.6 kg)  01/12/18 163 lb (73.9 kg)  01/12/18 163 lb 1.6 oz (74 kg)    Physical Exam  Constitutional: She appears well-developed and well-nourished.  HENT:  Mouth/Throat: Mucous membranes are normal.  Eyes: EOM are normal. No scleral icterus.  Cardiovascular: Normal rate. An irregularly irregular rhythm present.  Pulmonary/Chest: Effort normal and breath sounds normal.  Neurological:  Gait is a little slow, rather deliberate; did not need any assistance  Skin:  Mild erythema upper thighs; patient resistant to any further exam  Psychiatric: Her behavior is normal. Her mood appears not anxious. Her affect is blunt. Her affect is not inappropriate. Cognition and memory are impaired. She does not express inappropriate judgment. She does not exhibit a depressed mood. She exhibits abnormal recent memory.    Results for orders placed or performed during the hospital encounter of 33/82/50  Basic metabolic panel  Result Value Ref Range   Sodium 145 135 - 145 mmol/L   Potassium 3.8 3.5 - 5.1 mmol/L   Chloride 110 101 - 111 mmol/L   CO2 28 22 - 32 mmol/L   Glucose, Bld 117 (H) 65 - 99 mg/dL   BUN 27 (H) 6 -  20 mg/dL   Creatinine, Ser 0.98 0.44 - 1.00 mg/dL   Calcium 8.5 (L) 8.9 - 10.3 mg/dL   GFR calc non Af Amer 54 (L) >60 mL/min   GFR calc Af Amer >60 >60 mL/min   Anion gap 7 5 - 15  CBC  Result Value Ref Range   WBC 8.5 3.6 - 11.0 K/uL   RBC 4.38 3.80 - 5.20 MIL/uL   Hemoglobin 12.7 12.0 - 16.0 g/dL   HCT 38.9 35.0 - 47.0 %   MCV 88.9 80.0 - 100.0 fL   MCH 29.0 26.0 - 34.0 pg   MCHC 32.7 32.0 - 36.0 g/dL   RDW 14.8 (H) 11.5 - 14.5 %   Platelets 311 150 - 440 K/uL  Urinalysis, Complete w Microscopic  Result Value Ref Range   Color, Urine YELLOW (A) YELLOW   APPearance HAZY (A) CLEAR   Specific Gravity, Urine 1.027 1.005 - 1.030   pH 5.0 5.0 - 8.0   Glucose, UA NEGATIVE NEGATIVE mg/dL   Hgb urine dipstick NEGATIVE NEGATIVE   Bilirubin Urine NEGATIVE NEGATIVE   Ketones, ur NEGATIVE NEGATIVE mg/dL   Protein, ur NEGATIVE NEGATIVE mg/dL   Nitrite NEGATIVE NEGATIVE   Leukocytes, UA NEGATIVE NEGATIVE   RBC / HPF 0-5 0 - 5 RBC/hpf   WBC, UA 0-5 0 - 5 WBC/hpf   Bacteria, UA NONE SEEN NONE SEEN   Squamous Epithelial / LPF 0-5 (A) NONE SEEN   Mucus PRESENT       Assessment & Plan:   Problem List Items Addressed This Visit    None       Follow up plan: No Follow-up on file.  An after-visit summary was printed and given to the patient at [facility].  Please see the patient instructions which may contain other information and recommendations beyond what is mentioned above in the assessment and plan.  Meds ordered this encounter  Medications  . fluconazole (DIFLUCAN) 150 MG tablet    Sig: Take 1 tablet (150 mg total) by mouth once for 1 dose.    Dispense:  1 tablet    Refill:  0  . Cyanocobalamin (VITAMIN B-12) 500 MCG SUBL    Sig: One by mouth daily    Dispense:  90 tablet    Refill:  3    No orders of the defined types were placed in this encounter.

## 2018-03-02 ENCOUNTER — Telehealth: Payer: Self-pay | Admitting: Cardiovascular Disease

## 2018-03-02 NOTE — Telephone Encounter (Signed)
Pt spouse calling needing to know before calling in a refill on Xarelto  Is patient supposed to be taking this  He states she's not been taking it   Please advise

## 2018-03-02 NOTE — Telephone Encounter (Signed)
I spoke with patient's spouse, [name], who reports patient is not currently taking xarelto. She resides at [facility] and spouse states he was told by [facility] that it has not been filled since June 2018. [facility] dispenses medications based on prescriptions at pharmacy and he is concerned she has been without it for several months. Advised spouse to contact [facility] to inquire if xarelto 15mg  is being given to patient.  He is aware that refills were sent to pharmacy January 17, 2018 by Dr. Sanda Klein.  Pt verbalized understanding of conversation and is agreeable with plan.

## 2018-03-10 ENCOUNTER — Ambulatory Visit: Payer: Medicare Other | Admitting: Family Medicine

## 2018-03-17 ENCOUNTER — Ambulatory Visit: Payer: Medicare Other | Admitting: Cardiovascular Disease

## 2018-03-28 DIAGNOSIS — J069 Acute upper respiratory infection, unspecified: Secondary | ICD-10-CM | POA: Diagnosis not present

## 2018-03-29 ENCOUNTER — Telehealth: Payer: Self-pay | Admitting: Family Medicine

## 2018-03-29 NOTE — Telephone Encounter (Signed)
Copied from [place] [id number]. Topic: General - Other >> Mar 29, 2018  1:10 PM Margot Ables wrote: Reason for CRM: pt husband calling to check on status of paperwork that was faxed over from [facility] that is needed for pts residency and to keep them in compliance. Please call him with status update.

## 2018-03-30 NOTE — Telephone Encounter (Signed)
Will reprint and refax documents under media

## 2018-03-30 NOTE — Telephone Encounter (Signed)
Please advise 

## 2018-04-05 ENCOUNTER — Ambulatory Visit: Payer: Medicare Other | Admitting: Nurse Practitioner

## 2018-04-05 ENCOUNTER — Telehealth: Payer: Self-pay | Admitting: Family Medicine

## 2018-04-05 NOTE — Telephone Encounter (Signed)
Copied from [place] [id number]. Topic: Quick Communication - See Telephone Encounter >> Apr 05, 2018 10:43 AM Cleaster Corin, NT wrote: CRM for notification. See Telephone encounter for: 04/05/18.  Maudie Mercury ( memory care unit) calling from [employer] wanting to know if a fax has been received for pt. Maudie Mercury can be reached at [phone]

## 2018-04-05 NOTE — Telephone Encounter (Signed)
Tried calling Jennifer Klein back to let her know the paperwork is here in the office, put on hold for an extended amount of time. The paperwork has been given to the PCP

## 2018-04-08 ENCOUNTER — Other Ambulatory Visit: Payer: Self-pay | Admitting: Cardiovascular Disease

## 2018-04-13 ENCOUNTER — Other Ambulatory Visit: Payer: Self-pay | Admitting: Family Medicine

## 2018-04-13 ENCOUNTER — Telehealth: Payer: Self-pay

## 2018-04-13 NOTE — Telephone Encounter (Signed)
Copied from [place] [id number]. Topic: Inquiry >> Apr 13, 2018  2:20 PM Vernona Rieger wrote: Reason for CRM: Patient's husband would like to know if the social security admin has faxed over a form to be signed by Dr lada in regards to him being her payee. He said that is keeps having to drive over to [place] [facility] just to ask them a question because he can not get them on the phone. He would like a call back @ [phone]

## 2018-04-14 NOTE — Telephone Encounter (Signed)
I have no knowledge of this form and have not seen it.

## 2018-04-15 NOTE — Telephone Encounter (Signed)
I do not see anything; please let him know

## 2018-04-18 NOTE — Telephone Encounter (Signed)
Jennifer Klein notified, we have not received any forms

## 2018-04-28 ENCOUNTER — Encounter: Payer: Self-pay | Admitting: Cardiovascular Disease

## 2018-04-28 ENCOUNTER — Ambulatory Visit (INDEPENDENT_AMBULATORY_CARE_PROVIDER_SITE_OTHER): Payer: Medicare Other | Admitting: Cardiovascular Disease

## 2018-04-28 VITALS — BP 108/60 | HR 64 | Ht 62.0 in | Wt 142.0 lb

## 2018-04-28 DIAGNOSIS — I10 Essential (primary) hypertension: Secondary | ICD-10-CM

## 2018-04-28 DIAGNOSIS — I480 Paroxysmal atrial fibrillation: Secondary | ICD-10-CM | POA: Diagnosis not present

## 2018-04-28 NOTE — Progress Notes (Signed)
Cardiology Office Note   Date:  04/28/2018   ID:  Jennifer Klein, DOB [birth year], MRN [id number]  PCP:  Arnetha Courser, MD  Cardiologist:   Kathlyn Sacramento, MD   Chief Complaint  Patient presents with  . Other    Past due 6 month follow up. patient denies chest pain and SOB. Patient was last seen 03/2017. Meds reviewed verbally with patient.       History of Present Illness: Jennifer Klein is a 77 y.o. female who presents for  a followup visit regarding paroxysmal atrial fibrillation.  She has known history of hypertension and advanced dementia.  Echocardiogram in September of 2014 showed low normal LV systolic function with an ejection fraction of 50-55%, mild mitral regurgitation and mildly dilated left atrium. She has known history of Barrett's esophagus with controlled symptoms. She is a resident at [facility].  She is not able to provide much history due to advanced dementia but no reported chest pain, shortness of breath or palpitations according to her records.  She was switched from carvedilol to metoprolol during last visit as she was in atrial fibrillation at that time.  She is noted to be in sinus rhythm today.  Past Medical History:  Diagnosis Date  . Alzheimer's dementia with behavioral disturbance 05/04/2016  . Alzheimer's dementia without behavioral disturbance 05/04/2016  . B12 deficiency 05/06/2016  . Gout   . Hemorrhoids   . Hyperlipidemia LDL goal <100 05/05/2016  . Hypertension   . Memory loss   . PAF (paroxysmal atrial fibrillation) ([place])    a. on Xarelto; b. 48-hr Holter 2014: NSR with PAF/flutter which happened at least 2.26% of the time w/ associated tachycardia. Patient was asymptomatic; c. CHADS2VASc => 4 (HTN, age x 2, female)  . Paroxysmal atrial fibrillation ([place]) 08/07/2013  . Senile osteoporosis   . Systolic dysfunction    a. echo 08/2013: EF 50-55%, mild MR, mildly dilated LA    Past Surgical History:  Procedure Laterality Date  . TUBAL  LIGATION       Current Outpatient Medications  Medication Sig Dispense Refill  . atorvastatin (LIPITOR) 20 MG tablet TAKE 1 TABLET BY MOUTH AT BEDTIME 30 tablet 5  . busPIRone (BUSPAR) 5 MG tablet Take 1 tablet (5 mg total) 2 (two) times daily by mouth. 60 tablet 5  . cholecalciferol (VITAMIN D) 1000 units tablet TAKE 1 TABLET BY MOUTH ONCE DAILY 30 tablet 5  . Cyanocobalamin (VITAMIN B-12) 500 MCG SUBL One by mouth daily 90 tablet 3  . donepezil (ARICEPT) 10 MG tablet TAKE 1 TABLET BY MOUTH BEDTIME 30 tablet 11  . memantine (NAMENDA) 10 MG tablet TAKE 1 TABLET BY MOUTH TWICE DAILY 60 tablet 11  . metoprolol tartrate (LOPRESSOR) 25 MG tablet TAKE 1 TABLET BY MOUTH TWICE A DAY 60 tablet 3  . pantoprazole (PROTONIX) 40 MG tablet TAKE 1 TABLET BY MOUTH DAILY 30 tablet 5  . ULORIC 40 MG tablet TAKE 1 TABLET BY MOUTH ONCE DAILY FOR GOUT 30 tablet 2  . XARELTO 15 MG TABS tablet TAKE 1 TABLET BY MOUTH DAILY WITH SUPPER 30 tablet 5   No current facility-administered medications for this visit.     Allergies:   Darvon [propoxyphene hcl]    Social History:  The patient  reports that she has quit smoking. Her smoking use included cigarettes. She has a 0.25 pack-year smoking history. She has never used smokeless tobacco. She reports that she does not drink alcohol or  use drugs.   Family History:  The patient's family history includes Gout in her brother; Heart disease in her father.    ROS:  Please see the history of present illness.   Otherwise, review of systems are positive for none.   All other systems are reviewed and negative.    PHYSICAL EXAM: VS:  BP 108/60 (BP Location: Right Arm, Patient Position: Sitting, Cuff Size: Normal)   Pulse 64   Ht 5\' 2"  (1.575 m)   Wt 142 lb (64.4 kg)   BMI 25.97 kg/m  , BMI Body mass index is 25.97 kg/m. GEN: Well nourished, well developed, in no acute distress  HEENT: normal  Neck: no JVD, carotid bruits, or masses Cardiac: Regular rate and rhythm;  no murmurs, rubs, or gallops,no edema  Respiratory:  clear to auscultation bilaterally, normal work of breathing GI: soft, nontender, nondistended, + BS MS: no deformity or atrophy  Skin: warm and dry, no rash Neuro:  Strength and sensation are intact Psych: euthymic mood, full affect   EKG:  EKG is ordered today. The ekg ordered today demonstrates sinus rhythm with incomplete right bundle branch block.   Recent Labs: 06/04/2017: ALT 14 01/12/2018: BUN 27; Creatinine, Ser 0.98; Hemoglobin 12.7; Platelets 311; Potassium 3.8; Sodium 145    Lipid Panel    Component Value Date/Time   CHOL 133 08/11/2016 0824   TRIG 151 (H) 08/11/2016 0824   HDL 40 08/11/2016 0824   CHOLHDL 3.3 08/11/2016 0824   CHOLHDL 3 06/03/2012 1017   VLDL 45.6 (H) 06/03/2012 1017   LDLCALC 63 08/11/2016 0824   LDLDIRECT 137.4 06/03/2012 1017      Wt Readings from Last 3 Encounters:  04/28/18 142 lb (64.4 kg)  01/27/18 160 lb (72.6 kg)  01/12/18 163 lb (73.9 kg)        ASSESSMENT AND PLAN:  1.  Paroxysmal atrial fibrillation: Currently in sinus rhythm with no symptoms.  Continue metoprolol.  Continue anticoagulation with Xarelto.  She has been in the lower dose due to fluctuating renal function.  2. Essential hypertension: Blood pressure is controlled on current medications.   Disposition:   FU with me in 6 months  Signed,  Kathlyn Sacramento, MD  04/28/2018 10:31 AM    [place]

## 2018-04-28 NOTE — Patient Instructions (Signed)
Medication Instructions: Continue same medications.   Labwork: None.   Procedures/Testing: None.   Follow-Up: 6 months with Dr. Marianna Cid.   Any Additional Special Instructions Will Be Listed Below (If Applicable).     If you need a refill on your cardiac medications before your next appointment, please call your pharmacy.   

## 2018-05-25 ENCOUNTER — Telehealth: Payer: Self-pay | Admitting: Family Medicine

## 2018-05-25 DIAGNOSIS — R8290 Unspecified abnormal findings in urine: Secondary | ICD-10-CM

## 2018-05-25 NOTE — Telephone Encounter (Signed)
Copied from [place] [id number]. Topic: Quick Communication - See Telephone Encounter >> May 25, 2018  1:12 PM Robina Ade, Helene Kelp D wrote: CRM for notification. See Telephone encounter for: 05/25/18. Anderson Malta from [employer] says that patient has a smell when urinate and she would like to talk to Dr. Sanda Klein or her CMA about this. She can be reached at [phone] in the memory care department.

## 2018-05-26 NOTE — Telephone Encounter (Signed)
Have her get a STAT urinalysis with dip, micro, and reflex culture If she is having any symptoms (weakness, worsening confusion, fever, abdominal pain, etc.), then get her to an urgent care now instead

## 2018-05-26 NOTE — Telephone Encounter (Signed)
Faxed order to [facility] [phone]

## 2018-05-30 ENCOUNTER — Other Ambulatory Visit: Payer: Self-pay | Admitting: Family Medicine

## 2018-06-07 ENCOUNTER — Other Ambulatory Visit: Payer: Self-pay | Admitting: Family Medicine

## 2018-06-15 ENCOUNTER — Other Ambulatory Visit: Payer: Self-pay

## 2018-06-15 ENCOUNTER — Ambulatory Visit: Payer: Self-pay | Admitting: Family Medicine

## 2018-06-15 ENCOUNTER — Telehealth: Payer: Self-pay | Admitting: Family Medicine

## 2018-06-15 ENCOUNTER — Emergency Department: Payer: Medicare Other

## 2018-06-15 ENCOUNTER — Encounter: Payer: Self-pay | Admitting: Emergency Medicine

## 2018-06-15 ENCOUNTER — Emergency Department
Admission: EM | Admit: 2018-06-15 | Discharge: 2018-06-16 | Disposition: A | Discharge status: Skilled Nursing Facility | Payer: Medicare Other | Attending: Emergency Medicine | Admitting: Emergency Medicine

## 2018-06-15 DIAGNOSIS — I130 Hypertensive heart and chronic kidney disease with heart failure and stage 1 through stage 4 chronic kidney disease, or unspecified chronic kidney disease: Secondary | ICD-10-CM | POA: Insufficient documentation

## 2018-06-15 DIAGNOSIS — N183 Chronic kidney disease, stage 3 (moderate): Secondary | ICD-10-CM | POA: Insufficient documentation

## 2018-06-15 DIAGNOSIS — I480 Paroxysmal atrial fibrillation: Secondary | ICD-10-CM | POA: Diagnosis not present

## 2018-06-15 DIAGNOSIS — N3000 Acute cystitis without hematuria: Secondary | ICD-10-CM | POA: Diagnosis not present

## 2018-06-15 DIAGNOSIS — I5020 Unspecified systolic (congestive) heart failure: Secondary | ICD-10-CM | POA: Insufficient documentation

## 2018-06-15 DIAGNOSIS — E119 Type 2 diabetes mellitus without complications: Secondary | ICD-10-CM | POA: Diagnosis not present

## 2018-06-15 DIAGNOSIS — Z743 Need for continuous supervision: Secondary | ICD-10-CM | POA: Diagnosis not present

## 2018-06-15 DIAGNOSIS — Z87891 Personal history of nicotine dependence: Secondary | ICD-10-CM | POA: Diagnosis not present

## 2018-06-15 DIAGNOSIS — N289 Disorder of kidney and ureter, unspecified: Secondary | ICD-10-CM

## 2018-06-15 DIAGNOSIS — R451 Restlessness and agitation: Secondary | ICD-10-CM | POA: Diagnosis not present

## 2018-06-15 DIAGNOSIS — G309 Alzheimer's disease, unspecified: Secondary | ICD-10-CM | POA: Insufficient documentation

## 2018-06-15 DIAGNOSIS — R410 Disorientation, unspecified: Secondary | ICD-10-CM | POA: Diagnosis not present

## 2018-06-15 DIAGNOSIS — R4182 Altered mental status, unspecified: Secondary | ICD-10-CM | POA: Diagnosis not present

## 2018-06-15 LAB — URINALYSIS, COMPLETE (UACMP) WITH MICROSCOPIC
Bilirubin Urine: NEGATIVE
Glucose, UA: NEGATIVE mg/dL
Hgb urine dipstick: NEGATIVE
Ketones, ur: NEGATIVE mg/dL
NITRITE: NEGATIVE
PH: 7 (ref 5.0–8.0)
Protein, ur: NEGATIVE mg/dL
SPECIFIC GRAVITY, URINE: 1.011 (ref 1.005–1.030)

## 2018-06-15 LAB — CBC
HEMATOCRIT: 36.8 % (ref 35.0–47.0)
Hemoglobin: 12.8 g/dL (ref 12.0–16.0)
MCH: 30.9 pg (ref 26.0–34.0)
MCHC: 34.7 g/dL (ref 32.0–36.0)
MCV: 89 fL (ref 80.0–100.0)
Platelets: 278 10*3/uL (ref 150–440)
RBC: 4.13 MIL/uL (ref 3.80–5.20)
RDW: 13.9 % (ref 11.5–14.5)
WBC: 8.5 10*3/uL (ref 3.6–11.0)

## 2018-06-15 LAB — COMPREHENSIVE METABOLIC PANEL
ALBUMIN: 3.4 g/dL — AB (ref 3.5–5.0)
ALT: 12 U/L (ref 0–44)
AST: 27 U/L (ref 15–41)
Alkaline Phosphatase: 104 U/L (ref 38–126)
Anion gap: 7 (ref 5–15)
BILIRUBIN TOTAL: 0.4 mg/dL (ref 0.3–1.2)
BUN: 18 mg/dL (ref 8–23)
CO2: 23 mmol/L (ref 22–32)
Calcium: 8.5 mg/dL — ABNORMAL LOW (ref 8.9–10.3)
Chloride: 110 mmol/L (ref 98–111)
Creatinine, Ser: 1.04 mg/dL — ABNORMAL HIGH (ref 0.44–1.00)
GFR calc Af Amer: 58 mL/min — ABNORMAL LOW (ref >60)
GFR calc non Af Amer: 50 mL/min — ABNORMAL LOW (ref >60)
GLUCOSE: 118 mg/dL — AB (ref 70–99)
POTASSIUM: 3.8 mmol/L (ref 3.5–5.1)
SODIUM: 140 mmol/L (ref 135–145)
TOTAL PROTEIN: 6.4 g/dL — AB (ref 6.5–8.1)

## 2018-06-15 MED ORDER — SULFAMETHOXAZOLE-TRIMETHOPRIM 800-160 MG PO TABS
1.0000 | ORAL_TABLET | Freq: Once | ORAL | Status: AC
  Administered 2018-06-15: 1 via ORAL
  Filled 2018-06-15: qty 1

## 2018-06-15 MED ORDER — DOXYCYCLINE HYCLATE 100 MG PO TABS: 100.0000 mg | ORAL_TABLET | Freq: Two times a day (BID) | ORAL | 0 refills | Status: DC

## 2018-06-15 MED ORDER — SULFAMETHOXAZOLE-TRIMETHOPRIM 800-160 MG PO TABS: 1.0000 | ORAL_TABLET | Freq: Two times a day (BID) | ORAL | 0 refills | Status: DC

## 2018-06-15 NOTE — ED Provider Notes (Signed)
[facility] Emergency Department Provider Note  ____________________________________________  Time seen: Approximately 8:40 PM  I have reviewed the triage vital signs and the nursing notes.   HISTORY  Chief Complaint Altered Mental Status and Aggressive Behavior    HPI Jennifer Klein is a 77 y.o. female history of Alzheimer's dementia with behavioral disturbance, HTN, HL, paroxysmal A. fib on Xarelto brought by EMS for agitation.  The patient is unable to give any history due to her dementia.  She is unable to answer even basic questions.  Per report the patient was sent for increasing agitation today.  Past Medical History:  Diagnosis Date  . Alzheimer's dementia with behavioral disturbance 05/04/2016  . Alzheimer's dementia without behavioral disturbance 05/04/2016  . B12 deficiency 05/06/2016  . Gout   . Hemorrhoids   . Hyperlipidemia LDL goal <100 05/05/2016  . Hypertension   . Memory loss   . PAF (paroxysmal atrial fibrillation) ([place])    a. on Xarelto; b. 48-hr Holter 2014: NSR with PAF/flutter which happened at least 2.26% of the time w/ associated tachycardia. Patient was asymptomatic; c. CHADS2VASc => 4 (HTN, age x 2, female)  . Paroxysmal atrial fibrillation ([place]) 08/07/2013  . Senile osteoporosis   . Systolic dysfunction    a. echo 08/2013: EF 50-55%, mild MR, mildly dilated LA    Patient Active Problem List   Diagnosis Date Noted  . Fall 09/10/2017  . PAD (peripheral artery disease) ([place]) 06/22/2017  . Chronic venous insufficiency 06/22/2017  . CKD (chronic kidney disease) stage 3, GFR 30-59 ml/min (HCC) 09/04/2016  . B12 deficiency 05/06/2016  . Hyperlipidemia LDL goal <100 05/05/2016  . Alzheimer's dementia without behavioral disturbance 05/04/2016  . Medication monitoring encounter 05/04/2016  . Barrett's esophagus 11/09/2015  . Paroxysmal atrial fibrillation ([place]) 08/07/2013  . Osteopenia 08/08/2012  . Hypertension 06/03/2012  . Gout  06/03/2012    Past Surgical History:  Procedure Laterality Date  . TUBAL LIGATION      Current Outpatient Rx  . Order #: [id number] Class: Normal  . Order #: [id number] Class: Normal  . Order #: [id number] Class: Normal  . Order #: [id number] Class: Normal  . Order #: [id number] Class: Normal  . Order #: [id number] Class: Normal  . Order #: [id number] Class: Normal  . Order #: [id number] Class: Normal  . Order #: [id number] Class: Print  . Order #: [id number] Class: Normal  . Order #: [id number] Class: Normal    Allergies Darvon [propoxyphene hcl]  Family History  Problem Relation Age of Onset  . Heart disease Father   . Gout Brother     Social History Social History   Tobacco Use  . Smoking status: Former Smoker    Packs/day: 0.25    Years: 1.00    Pack years: 0.25    Types: Cigarettes  . Smokeless tobacco: Never Used  Substance Use Topics  . Alcohol use: No    Alcohol/week: 0.0 oz  . Drug use: No    Review of Systems  ____________________________________________   PHYSICAL EXAM:  VITAL SIGNS: ED Triage Vitals  Enc Vitals Group     BP 06/15/18 1854 120/82     Pulse Rate 06/15/18 1854 93     Resp 06/15/18 1854 16     Temp 06/15/18 1854 98.8 F (37.1 C)     Temp Source 06/15/18 1854 Oral     SpO2 06/15/18 1854 99 %     Weight 06/15/18 1855 169 lb 12.1 oz (77 kg)     Height 06/15/18 1855 5\' 5"  (1.651  m)     Head Circumference --      Peak Flow --      Pain Score --      Pain Loc --      Pain Edu? --      Excl. in [place]? --     Constitutional: The patient is alert but not oriented.  She is resting comfortably. Eyes: Conjunctivae are normal.  EOMI. PERRLA.  No scleral icterus. Head: Atraumatic. Nose: No congestion/rhinnorhea. Mouth/Throat: Mucous membranes are moist.  Neck: No stridor.  Supple.  JVD.  No meningismus. Cardiovascular: Normal rate, regular rhythm. No murmurs, rubs or gallops.  Respiratory: Normal respiratory effort.  No accessory muscle use or  retractions. Lungs CTAB.  No wheezes, rales or ronchi. Gastrointestinal: Soft, nontender and nondistended.  No guarding or rebound.  No peritoneal signs. Musculoskeletal: No LE edema. Neurologic:  A&Ox3.  Speech is clear but occasionally her answers are nonsensical.  Face and smile are symmetric.  EOMI. PERRLA.  No vertical or horizontal nystagmus.  Moves all extremities well. Skin:  Skin is warm, dry and intact. No rash noted. Psychiatric: Mood and affect are normal.  The patient is compliant as much as she can be, with no evidence of agitation in the emergency department.  ____________________________________________   LABS (all labs ordered are listed, but only abnormal results are displayed)  Labs Reviewed  COMPREHENSIVE METABOLIC PANEL - Abnormal; Notable for the following components:      Result Value   Glucose, Bld 118 (*)    Creatinine, Ser 1.04 (*)    Calcium 8.5 (*)    Total Protein 6.4 (*)    Albumin 3.4 (*)    GFR calc non Af Amer 50 (*)    GFR calc Af Amer 58 (*)    All other components within normal limits  URINALYSIS, COMPLETE (UACMP) WITH MICROSCOPIC - Abnormal; Notable for the following components:   Color, Urine YELLOW (*)    APPearance CLOUDY (*)    Leukocytes, UA TRACE (*)    Bacteria, UA MANY (*)    Squamous Epithelial / LPF >50 (*)    All other components within normal limits  URINE CULTURE  CBC  CBG MONITORING, ED   ____________________________________________  EKG  ED ECG REPORT I, Eula Listen, the attending physician, personally viewed and interpreted this ECG.   Date: 06/15/2018  EKG Time: 2145  Rate: 88  Rhythm: Computer states this is atrial flutter, but it is not typical for atrial flutter and is not a regular rhythm.  We will get repeat EKG for reevaluation.  The patient does have a right bundle branch block.  This EKG is compared to prior and does appear different.  Axis: normal  Intervals:none  ST&T Change: No  STEMI  ----------------------------------------- 11:37 PM on 06/15/2018 -----------------------------------------  Repeat EKG has been obtained: ED ECG REPORT I, Eula Listen, the attending physician, personally viewed and interpreted this ECG.   Date: 06/15/2018  EKG Time: 2326  Rate: 79  Rhythm: atrial fibrillation  Axis: normal  Intervals:none  ST&T Change: No STEMI   ____________________________________________  RADIOLOGY  Ct Head Wo Contrast  Result Date: 06/15/2018 CLINICAL DATA:  Increased confusion and aggressive behavior. EXAM: CT HEAD WITHOUT CONTRAST TECHNIQUE: Contiguous axial images were obtained from the base of the skull through the vertex without intravenous contrast. COMPARISON:  10/16/2017 FINDINGS: Brain: Similar findings of advanced atrophy with sulcal prominence centralized volume loss with commensurate ex vacuo dilatation of the ventricular system. Rather extensive periventricular  hypodensities compatible microvascular ischemic disease. old lacunar infarcts within the right basal ganglia (images 12 and 13, series 2), progressed compared to the 09/2017 examination. Given extensive background parenchymal abnormalities, there is no CT evidence of superimposed acute large territory infarct. No intraparenchymal or extra-axial mass or hemorrhage. Unchanged size and configuration of the ventricles and the basilar cisterns. No midline shift. Vascular: Intracranial atherosclerosis. Skull: No displaced calvarial fracture. Sinuses/Orbits: There is under pneumatization of the bilateral frontal sinuses, right greater than left. The remaining paranasal sinuses and mastoid air cells remain normally aerated. No air-fluid levels. Other: Regional soft tissues appear normal. IMPRESSION: Suspected progression of extensive microvascular ischemic disease and associated atrophy without definitive superimposed acute intracranial process. Electronically Signed   By: Sandi Mariscal M.D.    On: 06/15/2018 20:59    ____________________________________________   PROCEDURES  Procedure(s) performed: None  Procedures  Critical Care performed: No ____________________________________________   INITIAL IMPRESSION / ASSESSMENT AND PLAN / ED COURSE  Pertinent labs & imaging results that were available during my care of the patient were reviewed by me and considered in my medical decision making (see chart for details).  77 y.o. female with a history of Alzheimer's dementia, but to the emergency department for increasing agitation.  Overall, the patient is mildly hypertensive at 153/83 but otherwise hemodynamically stable.  She has no focal neurologic deficits, nor does she have any agitation on my exam.  We will start with a CT of the head, basic laboratory studies, UA, and EKG, and reevaluate the patient for final disposition.  ----------------------------------------- 11:26 PM on 06/15/2018 -----------------------------------------  The patient does have a urinary tract infection today.  A culture has been sent and she has been given her first dose of antibiotics in the emergency department.  She will go home with a prescription for antibiotics.  ----------------------------------------- 11:38 PM on 06/15/2018 -----------------------------------------  I have 2 EKGs here that are consistent with atrial fibrillation and it is not on the patient's list of medical diseases here.  She did not have any paperwork from [place], so I have spoken to the patient's nurse, who does state that her [place] to form has paroxysmal atrial fibrillation as 1 of the problem items listed.  This is not a new finding for the patient and at this time she is safe for discharge home.  Follow-up instructions and return precautions were typed out.  ____________________________________________  FINAL CLINICAL IMPRESSION(S) / ED DIAGNOSES  Final diagnoses:  Agitation  Acute cystitis without  hematuria  Renal insufficiency         NEW MEDICATIONS STARTED DURING THIS VISIT:  New Prescriptions   SULFAMETHOXAZOLE-TRIMETHOPRIM (BACTRIM DS,SEPTRA DS) 800-160 MG TABLET    Take 1 tablet by mouth 2 (two) times daily.      Eula Listen, MD 06/15/18 [phone]

## 2018-06-15 NOTE — ED Notes (Signed)
Pt attempted to use the restroom and give urine sample, pt unable to void at this time.

## 2018-06-15 NOTE — Telephone Encounter (Signed)
Spoke to flow coordinator at the practice at [address]  and advised to route the encounter to practice and they would forward  it to Dr Sanda Klein

## 2018-06-15 NOTE — ED Notes (Signed)
Bed alarm placed on pt's bed, yellow socks on and arm band placed.

## 2018-06-15 NOTE — Telephone Encounter (Signed)
Jennifer Klein called me personally from [facility] around 5:45 pm He said put in a call earlier today and wanted to f/u to be thorough I never received it, I explained, and thanked him for the call Patient has had mental status changes, urine odor, similar to prior UTI [phone] at [place] spoke with Jennifer Klein; patient is much more aggressive; very agitated They have not gotten any vitals They will send her to ER for evaluation and treatment now ------------------------------------------- I had sent in Rx to [place] for doxy sent while I was on hold with Jennifer Klein; canceled this Rx by leaving detailed voicemail at [place]

## 2018-06-15 NOTE — ED Triage Notes (Signed)
Patient from [facility] memory care. Per EMS, staff reports patient has had increasing confusion and aggressive behavior today. Upon arrival, patient is alert to self only. Calm and cooperative with staff at this time.

## 2018-06-15 NOTE — ED Notes (Signed)
Update given to [facility]

## 2018-06-15 NOTE — Discharge Instructions (Addendum)
Return to the emergency department if you develop changes in mental status, fever, fainting, or any other symptoms concerning to you.  Take the entire course of Bactrim for your urinary tract infection, even if you are feeling better.  Please have your primary care physician follow-up the results of your urine culture in 3 days.

## 2018-06-15 NOTE — Telephone Encounter (Signed)
Pt is at [facility] assisted [facility] has been there since march 2018 .Staff reports for the last week has been more agitated . Not  combative but is resistiveand babbling more than usual . Staff reports dark urine and foul smelling x  1  Week . Was on Ativan in past according to staff but nothing at  this time . Point of contact is Jonelle Sidle med tech CNA  [phone] supervisor at the facility    Answer Assessment - Initial Assessment Questions 1. LEVEL OF CONSCIOUSNESS: "How is he (she, the patient) acting right now?" (e.g., alert-oriented, confused, lethargic, stuporous, comatose)      Not acting right  Is agitated walks -  Speech is babbling more than normal   2. ONSET: "When did the confusion start?"  (minutes, hours, days)       Staff reports started about 1 week worse over last 2 days   3. PATTERN "Does this come and go, or has it been constant since it started?"  "Is it present now?"       Mainly on interacting with people  4. ALCOHOL or DRUGS: "Has he been drinking alcohol or taking any drugs?"       No  5. NARCOTIC MEDICATIONS: "Has he been receiving any narcotic medications?" (e.g., morphine, Vicodin)       No 6. CAUSE: "What do you think is causing the confusion?"        Staff at facility state may be uti   7. OTHER SYMPTOMS: "Are there any other symptoms?" (e.g., difficulty breathing, headache, fever, weakness)     Urine is dark  Scratches herself a lot in perineal area  was on lorazepam  In past according to staff  Protocols used: CONFUSION - [facility]

## 2018-06-16 NOTE — ED Notes (Signed)
Pt waiting for EMS for discharge.

## 2018-06-16 NOTE — ED Notes (Signed)
Pt changed and given peri care

## 2018-06-16 NOTE — ED Notes (Signed)
Pt changed and given peri care, new diaper applied

## 2018-06-16 NOTE — ED Notes (Signed)
EMS here to take pt back to [facility]

## 2018-06-16 NOTE — Telephone Encounter (Signed)
Note just received Thursday morning See other note about this issue I sent patient to the ER yesterday evening

## 2018-06-23 ENCOUNTER — Encounter: Payer: Self-pay | Admitting: Emergency Medicine

## 2018-06-23 ENCOUNTER — Emergency Department: Payer: Medicare Other

## 2018-06-23 ENCOUNTER — Other Ambulatory Visit: Payer: Self-pay

## 2018-06-23 ENCOUNTER — Inpatient Hospital Stay
Admission: EM | Admit: 2018-06-23 | Discharge: 2018-06-27 | DRG: 872 | Disposition: A | Discharge status: Skilled Nursing Facility | Payer: Medicare Other | Attending: Specialist | Admitting: Specialist

## 2018-06-23 DIAGNOSIS — R413 Other amnesia: Secondary | ICD-10-CM | POA: Diagnosis present

## 2018-06-23 DIAGNOSIS — M109 Gout, unspecified: Secondary | ICD-10-CM | POA: Diagnosis present

## 2018-06-23 DIAGNOSIS — E785 Hyperlipidemia, unspecified: Secondary | ICD-10-CM | POA: Diagnosis present

## 2018-06-23 DIAGNOSIS — I110 Hypertensive heart disease with heart failure: Secondary | ICD-10-CM | POA: Diagnosis not present

## 2018-06-23 DIAGNOSIS — K219 Gastro-esophageal reflux disease without esophagitis: Secondary | ICD-10-CM | POA: Diagnosis present

## 2018-06-23 DIAGNOSIS — R000 Tachycardia, unspecified: Secondary | ICD-10-CM

## 2018-06-23 DIAGNOSIS — I5020 Unspecified systolic (congestive) heart failure: Secondary | ICD-10-CM | POA: Diagnosis not present

## 2018-06-23 DIAGNOSIS — G309 Alzheimer's disease, unspecified: Secondary | ICD-10-CM | POA: Diagnosis present

## 2018-06-23 DIAGNOSIS — A4151 Sepsis due to Escherichia coli [E. coli]: Principal | ICD-10-CM | POA: Diagnosis present

## 2018-06-23 DIAGNOSIS — R109 Unspecified abdominal pain: Secondary | ICD-10-CM | POA: Diagnosis not present

## 2018-06-23 DIAGNOSIS — A419 Sepsis, unspecified organism: Secondary | ICD-10-CM | POA: Diagnosis not present

## 2018-06-23 DIAGNOSIS — E538 Deficiency of other specified B group vitamins: Secondary | ICD-10-CM | POA: Diagnosis present

## 2018-06-23 DIAGNOSIS — I480 Paroxysmal atrial fibrillation: Secondary | ICD-10-CM | POA: Diagnosis present

## 2018-06-23 DIAGNOSIS — Z7401 Bed confinement status: Secondary | ICD-10-CM | POA: Diagnosis not present

## 2018-06-23 DIAGNOSIS — B9620 Unspecified Escherichia coli [E. coli] as the cause of diseases classified elsewhere: Secondary | ICD-10-CM | POA: Diagnosis not present

## 2018-06-23 DIAGNOSIS — N390 Urinary tract infection, site not specified: Secondary | ICD-10-CM | POA: Diagnosis present

## 2018-06-23 DIAGNOSIS — Z7901 Long term (current) use of anticoagulants: Secondary | ICD-10-CM

## 2018-06-23 DIAGNOSIS — I10 Essential (primary) hypertension: Secondary | ICD-10-CM | POA: Diagnosis present

## 2018-06-23 DIAGNOSIS — Z888 Allergy status to other drugs, medicaments and biological substances status: Secondary | ICD-10-CM

## 2018-06-23 DIAGNOSIS — Z8249 Family history of ischemic heart disease and other diseases of the circulatory system: Secondary | ICD-10-CM

## 2018-06-23 DIAGNOSIS — N179 Acute kidney failure, unspecified: Secondary | ICD-10-CM | POA: Diagnosis present

## 2018-06-23 DIAGNOSIS — I4891 Unspecified atrial fibrillation: Secondary | ICD-10-CM | POA: Diagnosis not present

## 2018-06-23 DIAGNOSIS — R509 Fever, unspecified: Secondary | ICD-10-CM | POA: Diagnosis not present

## 2018-06-23 DIAGNOSIS — Z79899 Other long term (current) drug therapy: Secondary | ICD-10-CM | POA: Diagnosis not present

## 2018-06-23 DIAGNOSIS — Z87891 Personal history of nicotine dependence: Secondary | ICD-10-CM | POA: Diagnosis not present

## 2018-06-23 DIAGNOSIS — M810 Age-related osteoporosis without current pathological fracture: Secondary | ICD-10-CM | POA: Diagnosis present

## 2018-06-23 DIAGNOSIS — F419 Anxiety disorder, unspecified: Secondary | ICD-10-CM | POA: Diagnosis present

## 2018-06-23 DIAGNOSIS — R4182 Altered mental status, unspecified: Secondary | ICD-10-CM | POA: Diagnosis not present

## 2018-06-23 DIAGNOSIS — F0390 Unspecified dementia without behavioral disturbance: Secondary | ICD-10-CM | POA: Diagnosis not present

## 2018-06-23 DIAGNOSIS — F0281 Dementia in other diseases classified elsewhere with behavioral disturbance: Secondary | ICD-10-CM | POA: Diagnosis present

## 2018-06-23 DIAGNOSIS — R262 Difficulty in walking, not elsewhere classified: Secondary | ICD-10-CM | POA: Diagnosis not present

## 2018-06-23 LAB — COMPREHENSIVE METABOLIC PANEL
ALT: 17 U/L (ref 0–44)
AST: 37 U/L (ref 15–41)
Albumin: 3.7 g/dL (ref 3.5–5.0)
Alkaline Phosphatase: 106 U/L (ref 38–126)
Anion gap: 9 (ref 5–15)
BILIRUBIN TOTAL: 0.8 mg/dL (ref 0.3–1.2)
BUN: 24 mg/dL — AB (ref 8–23)
CO2: 21 mmol/L — ABNORMAL LOW (ref 22–32)
Calcium: 8.6 mg/dL — ABNORMAL LOW (ref 8.9–10.3)
Chloride: 106 mmol/L (ref 98–111)
Creatinine, Ser: 1.47 mg/dL — ABNORMAL HIGH (ref 0.44–1.00)
GFR, EST AFRICAN AMERICAN: 38 mL/min — AB (ref >60)
GFR, EST NON AFRICAN AMERICAN: 33 mL/min — AB (ref >60)
Glucose, Bld: 123 mg/dL — ABNORMAL HIGH (ref 70–99)
Potassium: 4.3 mmol/L (ref 3.5–5.1)
Sodium: 136 mmol/L (ref 135–145)
TOTAL PROTEIN: 7 g/dL (ref 6.5–8.1)

## 2018-06-23 LAB — CBC WITH DIFFERENTIAL/PLATELET
Basophils Absolute: 0 10*3/uL (ref 0–0.1)
Basophils Relative: 0 %
EOS PCT: 6 %
Eosinophils Absolute: 0.5 10*3/uL (ref 0–0.7)
HEMATOCRIT: 41.7 % (ref 35.0–47.0)
Hemoglobin: 14.2 g/dL (ref 12.0–16.0)
LYMPHS ABS: 0.6 10*3/uL — AB (ref 1.0–3.6)
LYMPHS PCT: 6 %
MCH: 30.6 pg (ref 26.0–34.0)
MCHC: 33.9 g/dL (ref 32.0–36.0)
MCV: 90.2 fL (ref 80.0–100.0)
MONO ABS: 0.7 10*3/uL (ref 0.2–0.9)
MONOS PCT: 7 %
NEUTROS ABS: 7.5 10*3/uL — AB (ref 1.4–6.5)
Neutrophils Relative %: 81 %
PLATELETS: 201 10*3/uL (ref 150–440)
RBC: 4.62 MIL/uL (ref 3.80–5.20)
RDW: 14.4 % (ref 11.5–14.5)
WBC: 9.3 10*3/uL (ref 3.6–11.0)

## 2018-06-23 LAB — URINALYSIS, COMPLETE (UACMP) WITH MICROSCOPIC
BACTERIA UA: NONE SEEN
Bilirubin Urine: NEGATIVE
Glucose, UA: NEGATIVE mg/dL
Hgb urine dipstick: NEGATIVE
Ketones, ur: NEGATIVE mg/dL
LEUKOCYTES UA: NEGATIVE
NITRITE: NEGATIVE
PH: 5 (ref 5.0–8.0)
Protein, ur: 30 mg/dL — AB
SPECIFIC GRAVITY, URINE: 1.027 (ref 1.005–1.030)

## 2018-06-23 LAB — INFLUENZA PANEL BY PCR (TYPE A & B)
INFLAPCR: NEGATIVE
Influenza B By PCR: NEGATIVE

## 2018-06-23 LAB — LACTIC ACID, PLASMA
LACTIC ACID, VENOUS: 1.9 mmol/L (ref 0.5–1.9)
LACTIC ACID, VENOUS: 1.2 mmol/L (ref 0.5–1.9)

## 2018-06-23 MED ORDER — SODIUM CHLORIDE 0.9 % IV BOLUS
1000.0000 mL | Freq: Once | INTRAVENOUS | Status: AC
  Administered 2018-06-23: 1000 mL via INTRAVENOUS

## 2018-06-23 MED ORDER — METOPROLOL TARTRATE 25 MG PO TABS
25.0000 mg | ORAL_TABLET | Freq: Once | ORAL | Status: AC
  Administered 2018-06-23: 25 mg via ORAL
  Filled 2018-06-23: qty 1

## 2018-06-23 MED ORDER — IOHEXOL 300 MG/ML  SOLN
75.0000 mL | Freq: Once | INTRAMUSCULAR | Status: AC | PRN
  Administered 2018-06-23: 75 mL via INTRAVENOUS

## 2018-06-23 MED ORDER — ACETAMINOPHEN 500 MG PO TABS
1000.0000 mg | ORAL_TABLET | Freq: Once | ORAL | Status: AC
  Administered 2018-06-23: 1000 mg via ORAL
  Filled 2018-06-23: qty 2

## 2018-06-23 MED ORDER — SODIUM CHLORIDE 0.9 % IV SOLN
2.0000 g | Freq: Once | INTRAVENOUS | Status: AC
  Administered 2018-06-23: 2 g via INTRAVENOUS
  Filled 2018-06-23: qty 2

## 2018-06-23 MED ORDER — SODIUM CHLORIDE 0.9 % IV BOLUS (SEPSIS)
1000.0000 mL | Freq: Once | INTRAVENOUS | Status: AC
  Administered 2018-06-23: 1000 mL via INTRAVENOUS

## 2018-06-23 MED ORDER — VANCOMYCIN HCL IN DEXTROSE 1-5 GM/200ML-% IV SOLN
1000.0000 mg | Freq: Once | INTRAVENOUS | Status: AC
  Administered 2018-06-23: 1000 mg via INTRAVENOUS
  Filled 2018-06-23: qty 200

## 2018-06-23 NOTE — Progress Notes (Signed)
CODE SEPSIS - PHARMACY COMMUNICATION  **Broad Spectrum Antibiotics should be administered within 1 hour of Sepsis diagnosis**  Time Code Sepsis Called/Page Received: @ 1719  Antibiotics Ordered: Cefepime 2g IV                                      Vancomycin 1g IV   Time of 1st antibiotic administration: @ 5992  Additional action taken by pharmacy: Contacted nurse at 1800 to check in about Abx being started.   If necessary, Name of Provider/Nurse Contacted: Devan    Pernell Dupre, PharmD, BCPS Clinical Pharmacist 06/23/2018 6:12 PM

## 2018-06-23 NOTE — ED Triage Notes (Signed)
Pt arrived from [facility] with complaints of fever and decreased ability to walk today. Pt normally walks independently per ems report and today she had an unsteady gait. Facility reports a temperature of 100.4. PT was seen and treated for a UTI last week.

## 2018-06-23 NOTE — ED Provider Notes (Signed)
[facility] Emergency Department Provider Note    First MD Initiated Contact with Patient 06/23/18 1701     (approximate)  I have reviewed the triage vital signs and the nursing notes.   HISTORY  Chief Complaint Fever  Level  Vcaveat:  Dementia, sepsis HPI Jennifer Klein is a 77 y.o. female is ER from [place] with reported fever for and increased weakness and inability walking today.  Patient otherwise neurologically at her baseline given her underlying dementia.  Was recently seen in the ER within the last month for urinary tract infection and discharged on Bactrim.  Facility reports the patient otherwise behaving at baseline but noted that she felt warm today and was more unsteady on her feet as she is typically able to ambulate on her own.    Past Medical History:  Diagnosis Date  . Alzheimer's dementia with behavioral disturbance 05/04/2016  . Alzheimer's dementia without behavioral disturbance 05/04/2016  . B12 deficiency 05/06/2016  . Gout   . Hemorrhoids   . Hyperlipidemia LDL goal <100 05/05/2016  . Hypertension   . Memory loss   . PAF (paroxysmal atrial fibrillation) ([place])    a. on Xarelto; b. 48-hr Holter 2014: NSR with PAF/flutter which happened at least 2.26% of the time w/ associated tachycardia. Patient was asymptomatic; c. CHADS2VASc => 4 (HTN, age x 2, female)  . Paroxysmal atrial fibrillation ([place]) 08/07/2013  . Senile osteoporosis   . Systolic dysfunction    a. echo 08/2013: EF 50-55%, mild MR, mildly dilated LA   Family History  Problem Relation Age of Onset  . Heart disease Father   . Gout Brother    Past Surgical History:  Procedure Laterality Date  . TUBAL LIGATION     Patient Active Problem List   Diagnosis Date Noted  . Sepsis ([place]) 06/23/2018  . Fall 09/10/2017  . PAD (peripheral artery disease) ([place]) 06/22/2017  . Chronic venous insufficiency 06/22/2017  . CKD (chronic kidney disease) stage 3, GFR 30-59 ml/min  (HCC) 09/04/2016  . B12 deficiency 05/06/2016  . Hyperlipidemia LDL goal <100 05/05/2016  . Alzheimer's dementia without behavioral disturbance 05/04/2016  . Medication monitoring encounter 05/04/2016  . Barrett's esophagus 11/09/2015  . Paroxysmal atrial fibrillation ([place]) 08/07/2013  . Osteopenia 08/08/2012  . Hypertension 06/03/2012  . Gout 06/03/2012      Prior to Admission medications   Medication Sig Start Date End Date Taking? Authorizing Provider  atorvastatin (LIPITOR) 20 MG tablet TAKE 1 TABLET BY MOUTH AT BEDTIME 06/07/18  Yes Lada, Satira Anis, MD  benzonatate (TESSALON) 200 MG capsule Take 200 mg by mouth 3 (three) times daily as needed for cough.   Yes [provider]  busPIRone (BUSPAR) 5 MG tablet TAKE 1 TABLET BY MOUTH 2 TIMES DAILY FORDEPRESSION 05/30/18  Yes Lada, Satira Anis, MD  cholecalciferol (VITAMIN D) 1000 units tablet TAKE 1 TABLET BY MOUTH ONCE DAILY 04/13/18  Yes Lada, Satira Anis, MD  Cyanocobalamin (VITAMIN B-12) 500 MCG SUBL One by mouth daily 01/27/18  Yes Lada, Satira Anis, MD  donepezil (ARICEPT) 10 MG tablet TAKE 1 TABLET BY MOUTH BEDTIME 08/04/17  Yes Lada, Satira Anis, MD  memantine (NAMENDA) 10 MG tablet TAKE 1 TABLET BY MOUTH TWICE DAILY 08/11/17  Yes Lada, Satira Anis, MD  metoprolol tartrate (LOPRESSOR) 25 MG tablet TAKE 1 TABLET BY MOUTH TWICE A DAY 04/08/18  Yes Wellington Hampshire, MD  pantoprazole (PROTONIX) 40 MG tablet TAKE 1 TABLET BY MOUTH DAILY 12/27/17  Yes Lada, Satira Anis, MD  sulfamethoxazole-trimethoprim (BACTRIM DS,SEPTRA DS) 800-160 MG tablet Take 1 tablet by mouth 2 (two) times daily. 06/15/18  Yes Eula Listen, MD  ULORIC 40 MG tablet TAKE 1 TABLET BY MOUTH ONCE DAILY FOR GOUT 12/16/17  Yes Lada, Satira Anis, MD  XARELTO 15 MG TABS tablet TAKE 1 TABLET BY MOUTH DAILY WITH SUPPER 01/17/18  Yes Lada, Satira Anis, MD    Allergies Darvon [propoxyphene hcl]    Social History Social History   Tobacco Use  . Smoking status: Former Smoker      Packs/day: 0.25    Years: 1.00    Pack years: 0.25    Types: Cigarettes  . Smokeless tobacco: Never Used  Substance Use Topics  . Alcohol use: No    Alcohol/week: 0.0 oz  . Drug use: No    Review of Systems Patient denies headaches, rhinorrhea, blurry vision, numbness, shortness of breath, chest pain, edema, cough, abdominal pain, nausea, vomiting, diarrhea, dysuria, fevers, rashes or hallucinations unless otherwise stated above in HPI. ____________________________________________   PHYSICAL EXAM:  VITAL SIGNS: Vitals:   06/23/18 2230 06/23/18 2300  BP: 125/70 114/70  Pulse: 85 83  Resp: 16 15  Temp:    SpO2: 100% 100%    Constitutional: Alert singing and disoriented x 3 Eyes: Conjunctivae are normal.  Head: Atraumatic. Nose: No congestion/rhinnorhea. Mouth/Throat: Mucous membranes are moist.   Neck: No stridor. Painless ROM.  Cardiovascular: tachycardic irregularly irregular rhythm. Grossly normal heart sounds.  Good peripheral circulation. Respiratory: Normal respiratory effort.  No retractions. Lungs CTAB. Gastrointestinal: Soft and nontender. No distention. No abdominal bruits. No CVA tenderness. Genitourinary: normal external genitalia Musculoskeletal: No lower extremity tenderness nor edema.  No joint effusions. Neurologic:  MAE spontaneously. No gross focal neurologic deficits are appreciated. No facial droop Skin:  Skin is cool and peripheral extremities are mottled. No rash noted. Psychiatric: unable to assess, disoriened, singing and not providing any additinoal hx  ____________________________________________   LABS (all labs ordered are listed, but only abnormal results are displayed)  Results for orders placed or performed during the hospital encounter of 06/23/18 (from the past 24 hour(s))  CBC with Differential/Platelet     Status: Abnormal   Collection Time: 06/23/18  5:06 PM  Result Value Ref Range   WBC 9.3 3.6 - 11.0 K/uL   RBC 4.62 3.80 -  5.20 MIL/uL   Hemoglobin 14.2 12.0 - 16.0 g/dL   HCT 41.7 35.0 - 47.0 %   MCV 90.2 80.0 - 100.0 fL   MCH 30.6 26.0 - 34.0 pg   MCHC 33.9 32.0 - 36.0 g/dL   RDW 14.4 11.5 - 14.5 %   Platelets 201 150 - 440 K/uL   Neutrophils Relative % 81 %   Neutro Abs 7.5 (H) 1.4 - 6.5 K/uL   Lymphocytes Relative 6 %   Lymphs Abs 0.6 (L) 1.0 - 3.6 K/uL   Monocytes Relative 7 %   Monocytes Absolute 0.7 0.2 - 0.9 K/uL   Eosinophils Relative 6 %   Eosinophils Absolute 0.5 0 - 0.7 K/uL   Basophils Relative 0 %   Basophils Absolute 0.0 0 - 0.1 K/uL  Comprehensive metabolic panel     Status: Abnormal   Collection Time: 06/23/18  5:06 PM  Result Value Ref Range   Sodium 136 135 - 145 mmol/L   Potassium 4.3 3.5 - 5.1 mmol/L   Chloride 106 98 - 111 mmol/L   CO2 21 (L) 22 - 32 mmol/L  Glucose, Bld 123 (H) 70 - 99 mg/dL   BUN 24 (H) 8 - 23 mg/dL   Creatinine, Ser 1.47 (H) 0.44 - 1.00 mg/dL   Calcium 8.6 (L) 8.9 - 10.3 mg/dL   Total Protein 7.0 6.5 - 8.1 g/dL   Albumin 3.7 3.5 - 5.0 g/dL   AST 37 15 - 41 U/L   ALT 17 0 - 44 U/L   Alkaline Phosphatase 106 38 - 126 U/L   Total Bilirubin 0.8 0.3 - 1.2 mg/dL   GFR calc non Af Amer 33 (L) >60 mL/min   GFR calc Af Amer 38 (L) >60 mL/min   Anion gap 9 5 - 15  Lactic acid, plasma     Status: None   Collection Time: 06/23/18  5:06 PM  Result Value Ref Range   Lactic Acid, Venous 1.9 0.5 - 1.9 mmol/L  Urinalysis, Complete w Microscopic     Status: Abnormal   Collection Time: 06/23/18  7:27 PM  Result Value Ref Range   Color, Urine YELLOW (A) YELLOW   APPearance CLEAR (A) CLEAR   Specific Gravity, Urine 1.027 1.005 - 1.030   pH 5.0 5.0 - 8.0   Glucose, UA NEGATIVE NEGATIVE mg/dL   Hgb urine dipstick NEGATIVE NEGATIVE   Bilirubin Urine NEGATIVE NEGATIVE   Ketones, ur NEGATIVE NEGATIVE mg/dL   Protein, ur 30 (A) NEGATIVE mg/dL   Nitrite NEGATIVE NEGATIVE   Leukocytes, UA NEGATIVE NEGATIVE   RBC / HPF 11-20 0 - 5 RBC/hpf   WBC, UA 0-5 0 - 5 WBC/hpf     Bacteria, UA NONE SEEN NONE SEEN   Squamous Epithelial / LPF 0-5 0 - 5   Mucus PRESENT   Lactic acid, plasma     Status: None   Collection Time: 06/23/18  8:20 PM  Result Value Ref Range   Lactic Acid, Venous 1.2 0.5 - 1.9 mmol/L  Influenza panel by PCR (type A & B)     Status: None   Collection Time: 06/23/18 11:02 PM  Result Value Ref Range   Influenza A By PCR NEGATIVE NEGATIVE   Influenza B By PCR NEGATIVE NEGATIVE   ____________________________________________  EKG My review and personal interpretation at Time: 17:38   Indication: tachycardia  Rate: 120  Rhythm: afib with rvr Axis: normal Other: nonspecific st abn, rbbb ____________________________________________  RADIOLOGY  I personally reviewed all radiographic images ordered to evaluate for the above acute complaints and reviewed radiology reports and findings.  These findings were personally discussed with the patient.  Please see medical record for radiology report.  ____________________________________________   PROCEDURES  Procedure(s) performed:  .Critical Care Performed by: Merlyn Lot, MD Authorized by: Merlyn Lot, MD   Critical care provider statement:    Critical care time (minutes):  10   Critical care time was exclusive of:  Separately billable procedures and treating other patients   Critical care was necessary to treat or prevent imminent or life-threatening deterioration of the following conditions:  CNS failure or compromise   Critical care was time spent personally by me on the following activities:  Development of treatment plan with patient or surrogate, discussions with consultants, evaluation of patient's response to treatment, examination of patient, obtaining history from patient or surrogate, ordering and performing treatments and interventions, ordering and review of laboratory studies, ordering and review of radiographic studies, pulse oximetry, re-evaluation of patient's  condition and review of old charts      Critical Care performed: yes ____________________________________________   INITIAL  IMPRESSION / ASSESSMENT AND PLAN / ED COURSE  Pertinent labs & imaging results that were available during my care of the patient were reviewed by me and considered in my medical decision making (see chart for details).   DDX: Dehydration, sepsis, pna, uti, hypoglycemia, cva, drug effect, withdrawal, encephalitis   MACKENZIE GROOM is a 77 y.o. who presents to the ED with status fever and tachycardia.  Patient with a rectal temperature of 102.  Tachycardic to 120s but does have a history of A. fib with RVR.  Patient made a code sepsis for work-up.  Does not seem to have any signs of meningitis or encephalitis.     Clinical Course as of Jun 23 2345  Thu Jun 23, 2018  2005 This point I do not have any identifiable source.  Will order CT imaging to rule out intra-abdominal pathology.   [PR]  2016 Patient's fever is improving.  Will hold code sepsis at this time.   [PR]  2053 CT imaging does show evidence of cholelithiasis but no evidence of cholecystitis.  Repeat abdominal exam is soft and benign.  Discussed case with hospitalist for admission.   [PR]  2201 This point patient remains [place] dynamically stable.  Her favorite has resolved.  No identifiable evidence of infectious process her lactate is normal and she is normotensive.  Blood cultures and urine cultures sent.  This not clinically consistent with encephalitis or meningitis.  At this point I do believe she stable and appropriate for outpatient follow-up.  Patient has been evaluated by hospitalist to agrees patient appears stable for discharge back to nursing facility.   [PR]  [facility] refusing to take patient back as she is unable to ambulate at this time.  Discussed case with hospitalist who kindly agrees to admit for PT, serial exams and reassessment.   [PR]    Clinical Course User Index [PR] Merlyn Lot, MD     As part of my medical decision making, I reviewed the following data within the [place] [facility] notes reviewed and incorporated, Labs reviewed, notes from prior ED visits.   ____________________________________________   FINAL CLINICAL IMPRESSION(S) / ED DIAGNOSES  Final diagnoses:  Fever, unspecified fever cause  Tachycardia      NEW MEDICATIONS STARTED DURING THIS VISIT:  New Prescriptions   No medications on file     Note:  This document was prepared using Dragon voice recognition software and may include unintentional dictation errors.    Merlyn Lot, MD 06/23/18 [id number]

## 2018-06-23 NOTE — H&P (Signed)
[place] [place] NAME: Kamaya Keckler    MR#:  [id number]  DATE OF BIRTH:  1941/01/21  DATE OF ADMISSION:  06/23/2018  PRIMARY CARE PHYSICIAN: Arnetha Courser, MD   REQUESTING/REFERRING PHYSICIAN:   CHIEF COMPLAINT:   Chief Complaint  Patient presents with  . Fever    HISTORY OF PRESENT ILLNESS: Jennifer Klein  is a 77 y.o. female, NH resident, with a known history of advanced dementia, hypertension, paroxysmal atrial fibrillation and other comorbidities. Patient is unable to provide reliable history due to dementia.  Information was taken from reviewing the medical chart and from discussion with emergency room physician. She was transferred from [place] to emergency room for fever and generalized weakness noted today.  No reports of cough, nausea, vomiting, abdominal pain, diarrhea, bleeding.  Patient is otherwise neurologically at her baseline.  She is usually very confused due to her dementia, only oriented to self. At the arrival to emergency room, patient was found to be febrile with a temperature of 102.7 and tachycardic with heart rate in 130s, in atrial fibrillation.  Blood tests show slightly elevated creatinine level at 1.47.  CBC is within normal limits.  UA is WNL, but patient has been on Bactrim for the past 7 days for UTI. Chest x-ray and CT of the abdomen, reviewed by myself, are negative for any acute findings. Patient is admitted for further evaluation and treatment.  PAST MEDICAL HISTORY:   Past Medical History:  Diagnosis Date  . Alzheimer's dementia with behavioral disturbance 05/04/2016  . Alzheimer's dementia without behavioral disturbance 05/04/2016  . B12 deficiency 05/06/2016  . Gout   . Hemorrhoids   . Hyperlipidemia LDL goal <100 05/05/2016  . Hypertension   . Memory loss   . PAF (paroxysmal atrial fibrillation) ([place])    a. on Xarelto; b. 48-hr Holter 2014: NSR with PAF/flutter which happened at least  2.26% of the time w/ associated tachycardia. Patient was asymptomatic; c. CHADS2VASc => 4 (HTN, age x 2, female)  . Paroxysmal atrial fibrillation ([place]) 08/07/2013  . Senile osteoporosis   . Systolic dysfunction    a. echo 08/2013: EF 50-55%, mild MR, mildly dilated LA    PAST SURGICAL HISTORY:  Past Surgical History:  Procedure Laterality Date  . TUBAL LIGATION      SOCIAL HISTORY:  Social History   Tobacco Use  . Smoking status: Former Smoker    Packs/day: 0.25    Years: 1.00    Pack years: 0.25    Types: Cigarettes  . Smokeless tobacco: Never Used  Substance Use Topics  . Alcohol use: No    Alcohol/week: 0.0 oz    FAMILY HISTORY:  Family History  Problem Relation Age of Onset  . Heart disease Father   . Gout Brother     DRUG ALLERGIES:  Allergies  Allergen Reactions  . Darvon [Propoxyphene Hcl]     As a child    REVIEW OF SYSTEMS:   Unable to obtain due to patient's dementia.  MEDICATIONS AT HOME:  Prior to Admission medications   Medication Sig Start Date End Date Taking? Authorizing Provider  atorvastatin (LIPITOR) 20 MG tablet TAKE 1 TABLET BY MOUTH AT BEDTIME 06/07/18  Yes Lada, Satira Anis, MD  benzonatate (TESSALON) 200 MG capsule Take 200 mg by mouth 3 (three) times daily as needed for cough.   Yes [provider]  busPIRone (BUSPAR) 5 MG tablet TAKE 1 TABLET BY MOUTH 2 TIMES  DAILY FORDEPRESSION 05/30/18  Yes Lada, Satira Anis, MD  cholecalciferol (VITAMIN D) 1000 units tablet TAKE 1 TABLET BY MOUTH ONCE DAILY 04/13/18  Yes Lada, Satira Anis, MD  Cyanocobalamin (VITAMIN B-12) 500 MCG SUBL One by mouth daily 01/27/18  Yes Lada, Satira Anis, MD  donepezil (ARICEPT) 10 MG tablet TAKE 1 TABLET BY MOUTH BEDTIME 08/04/17  Yes Lada, Satira Anis, MD  memantine (NAMENDA) 10 MG tablet TAKE 1 TABLET BY MOUTH TWICE DAILY 08/11/17  Yes Lada, Satira Anis, MD  metoprolol tartrate (LOPRESSOR) 25 MG tablet TAKE 1 TABLET BY MOUTH TWICE A DAY 04/08/18  Yes Wellington Hampshire, MD   pantoprazole (PROTONIX) 40 MG tablet TAKE 1 TABLET BY MOUTH DAILY 12/27/17  Yes Lada, Satira Anis, MD  sulfamethoxazole-trimethoprim (BACTRIM DS,SEPTRA DS) 800-160 MG tablet Take 1 tablet by mouth 2 (two) times daily. 06/15/18  Yes Mariea Clonts, Anne-Caroline, MD  ULORIC 40 MG tablet TAKE 1 TABLET BY MOUTH ONCE DAILY FOR GOUT 12/16/17  Yes Lada, Satira Anis, MD  XARELTO 15 MG TABS tablet TAKE 1 TABLET BY MOUTH DAILY WITH SUPPER 01/17/18  Yes Lada, Satira Anis, MD      PHYSICAL EXAMINATION:   VITAL SIGNS: Blood pressure 123/70, pulse 93, temperature 99.1 F (37.3 C), temperature source Rectal, resp. rate 16, height 5\' 6"  (1.676 m), weight 69.1 kg (152 lb 6.4 oz), SpO2 100 %.  GENERAL:  77 y.o.-year-old patient lying in the bed, confused and lethargic. EYES: Pupils equal, round, reactive to light and accommodation. No scleral icterus. Extraocular muscles intact.  HEENT: Head atraumatic, normocephalic. Oropharynx and nasopharynx clear.  NECK:  Supple, no jugular venous distention. No thyroid enlargement, no tenderness.  LUNGS: Reduced breath sounds bilaterally, no wheezing. No use of accessory muscles of respiration.  CARDIOVASCULAR: S1, S2 normal. No S3/S4.  ABDOMEN: Soft, nontender, nondistended. Bowel sounds present. No organomegaly or mass.  EXTREMITIES: No pedal edema, cyanosis, or clubbing.  NEUROLOGIC: No focal weakness. PSYCHIATRIC: The patient is alert, confused and lethargic. SKIN: No obvious rash, lesion, or ulcer.   LABORATORY PANEL:   CBC Recent Labs  Lab 06/23/18 1706  WBC 9.3  HGB 14.2  HCT 41.7  PLT 201  MCV 90.2  MCH 30.6  MCHC 33.9  RDW 14.4  LYMPHSABS 0.6*  MONOABS 0.7  EOSABS 0.5  BASOSABS 0.0   ------------------------------------------------------------------------------------------------------------------  Chemistries  Recent Labs  Lab 06/23/18 1706  NA 136  K 4.3  CL 106  CO2 21*  GLUCOSE 123*  BUN 24*  CREATININE 1.47*  CALCIUM 8.6*  AST 37  ALT 17   ALKPHOS 106  BILITOT 0.8   ------------------------------------------------------------------------------------------------------------------ estimated creatinine clearance is 29.5 mL/min (A) (by C-G formula based on SCr of 1.47 mg/dL (H)). ------------------------------------------------------------------------------------------------------------------ No results for input(s): TSH, T4TOTAL, T3FREE, THYROIDAB in the last 72 hours.  Invalid input(s): FREET3   Coagulation profile No results for input(s): INR, PROTIME in the last 168 hours. ------------------------------------------------------------------------------------------------------------------- No results for input(s): DDIMER in the last 72 hours. -------------------------------------------------------------------------------------------------------------------  Cardiac Enzymes No results for input(s): CKMB, TROPONINI, MYOGLOBIN in the last 168 hours.  Invalid input(s): CK ------------------------------------------------------------------------------------------------------------------ Invalid input(s): POCBNP  ---------------------------------------------------------------------------------------------------------------  Urinalysis    Component Value Date/Time   COLORURINE YELLOW (A) 06/23/2018 1927   APPEARANCEUR CLEAR (A) 06/23/2018 1927   APPEARANCEUR Cloudy (A) 05/04/2016 1423   LABSPEC 1.027 06/23/2018 1927   PHURINE 5.0 06/23/2018 1927   GLUCOSEU NEGATIVE 06/23/2018 1927   HGBUR NEGATIVE 06/23/2018 1927   BILIRUBINUR NEGATIVE 06/23/2018 1927   BILIRUBINUR Negative 05/04/2016  [place] 06/23/2018 1927   PROTEINUR 30 (A) 06/23/2018 1927   NITRITE NEGATIVE 06/23/2018 1927   LEUKOCYTESUR NEGATIVE 06/23/2018 1927   LEUKOCYTESUR Negative 05/04/2016 1423     RADIOLOGY: Ct Abdomen Pelvis W Contrast  Result Date: 06/23/2018 CLINICAL DATA:  77 year old female with acute abdominal pain and fever.  EXAM: CT ABDOMEN AND PELVIS WITH CONTRAST TECHNIQUE: Multidetector CT imaging of the abdomen and pelvis was performed using the standard protocol following bolus administration of intravenous contrast. CONTRAST:  78mL OMNIPAQUE IOHEXOL 300 MG/ML  SOLN COMPARISON:  None. FINDINGS: Lower chest: No acute abnormality. Hepatobiliary: The liver is unremarkable. Small amount of gallbladder sludge versus gallstones noted. No CT evidence of acute cholecystitis. No biliary dilatation. Pancreas: Unremarkable Spleen: Unremarkable Adrenals/Urinary Tract: The kidneys, adrenal glands and bladder are unremarkable. Stomach/Bowel: Stomach is within normal limits. No evidence of bowel wall thickening, distention, or inflammatory changes. Colonic diverticulosis noted without evidence of diverticulitis. Vascular/Lymphatic: Aortic atherosclerosis. No enlarged abdominal or pelvic lymph nodes. Reproductive: Uterus and bilateral adnexa are unremarkable. Other: No free fluid, abscess or pneumoperitoneum. Musculoskeletal: No acute or suspicious bony abnormalities. Degenerative changes within the lumbar spine are noted. IMPRESSION: 1. No acute abnormality 2. Small amount of gallbladder sludge versus cholelithiasis. No CT evidence of acute cholecystitis. 3.  Aortic Atherosclerosis (ICD10-I70.0). Electronically Signed   By: Margarette Canada M.D.   On: 06/23/2018 20:46   Dg Chest Portable 1 View  Result Date: 06/23/2018 CLINICAL DATA:  Altered mental status. EXAM: PORTABLE CHEST 1 VIEW COMPARISON:  01/12/2018 FINDINGS: Patient slightly rotated to the left. Lungs are adequately inflated and otherwise clear. Cardiomediastinal silhouette and remainder of the exam is unchanged. IMPRESSION: No active disease. Electronically Signed   By: Marin Olp M.D.   On: 06/23/2018 17:52    EKG: Orders placed or performed during the hospital encounter of 06/23/18  . ED EKG 12-Lead  . ED EKG 12-Lead    IMPRESSION AND PLAN:  1.  Sepsis of unknown origin.   We will start IV fluids and broad-spectrum IV antibiotics.  Continue to monitor clinically closely while waiting for urine and blood cultures results. 2.  Atrial fibrillation with rapid ventricular response, likely precipitated by infection.  We will treat with IV fluids and Cardizem.  Continue to monitor on telemetry.  Continue Xarelto for long-term anticoagulation. 3.  Acute renal failure, likely prerenal, secondary to poor p.o. Intake.  We will start gentle IV hydration and encourage p.o. fluid intake.  Continue to monitor kidney function closely and avoid nephrotoxic medications. 4.  Advanced dementia, stable, continue 24-hour monitoring.  All the records are reviewed and case discussed with ED provider. Management plans discussed with the patient, family and they are in agreement.  CODE STATUS: Full    TOTAL TIME TAKING CARE OF THIS PATIENT: 45 minutes.    Amelia Jo M.D on 06/23/2018 at 11:05 PM  Between 7am to 6pm - Pager - [phone]  After 6pm go to [email or web address] - password [email or web address] [facility]  Office  [phone]  CC: Primary care physician; Arnetha Courser, MD

## 2018-06-24 LAB — CBC
HCT: 35 % (ref 35.0–47.0)
Hemoglobin: 11.9 g/dL — ABNORMAL LOW (ref 12.0–16.0)
MCH: 30.5 pg (ref 26.0–34.0)
MCHC: 34 g/dL (ref 32.0–36.0)
MCV: 89.8 fL (ref 80.0–100.0)
PLATELETS: 149 10*3/uL — AB (ref 150–440)
RBC: 3.89 MIL/uL (ref 3.80–5.20)
RDW: 14.2 % (ref 11.5–14.5)
WBC: 5.5 10*3/uL (ref 3.6–11.0)

## 2018-06-24 LAB — BASIC METABOLIC PANEL
ANION GAP: 5 (ref 5–15)
BUN: 18 mg/dL (ref 8–23)
CALCIUM: 7.5 mg/dL — AB (ref 8.9–10.3)
CO2: 19 mmol/L — AB (ref 22–32)
CREATININE: 1.03 mg/dL — AB (ref 0.44–1.00)
Chloride: 111 mmol/L (ref 98–111)
GFR calc Af Amer: 59 mL/min — ABNORMAL LOW (ref >60)
GFR, EST NON AFRICAN AMERICAN: 51 mL/min — AB (ref >60)
Glucose, Bld: 100 mg/dL — ABNORMAL HIGH (ref 70–99)
Potassium: 3.2 mmol/L — ABNORMAL LOW (ref 3.5–5.1)
Sodium: 135 mmol/L (ref 135–145)

## 2018-06-24 LAB — GLUCOSE, CAPILLARY: Glucose-Capillary: 81 mg/dL (ref 70–99)

## 2018-06-24 LAB — MRSA PCR SCREENING: MRSA BY PCR: NEGATIVE

## 2018-06-24 LAB — MAGNESIUM: MAGNESIUM: 1.9 mg/dL (ref 1.7–2.4)

## 2018-06-24 MED ORDER — HYDROCODONE-ACETAMINOPHEN 5-325 MG PO TABS
1.0000 | ORAL_TABLET | ORAL | Status: DC | PRN
  Administered 2018-06-24 (×2): 2 via ORAL
  Filled 2018-06-24 (×2): qty 2

## 2018-06-24 MED ORDER — POTASSIUM CHLORIDE 20 MEQ PO PACK
40.0000 meq | PACK | Freq: Once | ORAL | Status: AC
  Administered 2018-06-24: 40 meq via ORAL
  Filled 2018-06-24: qty 2

## 2018-06-24 MED ORDER — BENZONATATE 100 MG PO CAPS: 200.0000 mg | ORAL_CAPSULE | Freq: Three times a day (TID) | ORAL | Status: DC | PRN

## 2018-06-24 MED ORDER — ONDANSETRON HCL 4 MG PO TABS: 4.0000 mg | ORAL_TABLET | Freq: Four times a day (QID) | ORAL | Status: DC | PRN

## 2018-06-24 MED ORDER — ACETAMINOPHEN 650 MG RE SUPP: 650.0000 mg | Freq: Four times a day (QID) | RECTAL | Status: DC | PRN

## 2018-06-24 MED ORDER — MEMANTINE HCL 5 MG PO TABS
10.0000 mg | ORAL_TABLET | Freq: Two times a day (BID) | ORAL | Status: DC
  Administered 2018-06-24 – 2018-06-27 (×8): 10 mg via ORAL
  Filled 2018-06-24 (×8): qty 2

## 2018-06-24 MED ORDER — SODIUM CHLORIDE 0.9 % IV SOLN
2.0000 g | INTRAVENOUS | Status: DC
  Filled 2018-06-24: qty 2

## 2018-06-24 MED ORDER — SODIUM CHLORIDE 0.9 % IV SOLN: Freq: Once | INTRAVENOUS | Status: DC

## 2018-06-24 MED ORDER — DOCUSATE SODIUM 100 MG PO CAPS
100.0000 mg | ORAL_CAPSULE | Freq: Two times a day (BID) | ORAL | Status: DC
  Administered 2018-06-24 – 2018-06-26 (×7): 100 mg via ORAL
  Filled 2018-06-24 (×8): qty 1

## 2018-06-24 MED ORDER — DONEPEZIL HCL 5 MG PO TABS
10.0000 mg | ORAL_TABLET | Freq: Every day | ORAL | Status: DC
  Administered 2018-06-24 – 2018-06-26 (×4): 10 mg via ORAL
  Filled 2018-06-24 (×5): qty 2

## 2018-06-24 MED ORDER — FEBUXOSTAT 40 MG PO TABS
40.0000 mg | ORAL_TABLET | Freq: Every day | ORAL | Status: DC
  Administered 2018-06-24 – 2018-06-27 (×4): 40 mg via ORAL
  Filled 2018-06-24 (×4): qty 1

## 2018-06-24 MED ORDER — BUSPIRONE HCL 5 MG PO TABS
5.0000 mg | ORAL_TABLET | Freq: Two times a day (BID) | ORAL | Status: DC
Start: 2018-06-24 — End: 2018-06-27
  Administered 2018-06-24 – 2018-06-27 (×8): 5 mg via ORAL
  Filled 2018-06-24 (×9): qty 1

## 2018-06-24 MED ORDER — ACETAMINOPHEN 325 MG PO TABS: 650.0000 mg | ORAL_TABLET | Freq: Four times a day (QID) | ORAL | Status: DC | PRN

## 2018-06-24 MED ORDER — PANTOPRAZOLE SODIUM 40 MG PO TBEC
40.0000 mg | DELAYED_RELEASE_TABLET | Freq: Every day | ORAL | Status: DC
  Administered 2018-06-24 – 2018-06-27 (×4): 40 mg via ORAL
  Filled 2018-06-24 (×4): qty 1

## 2018-06-24 MED ORDER — VANCOMYCIN HCL IN DEXTROSE 1-5 GM/200ML-% IV SOLN
1000.0000 mg | INTRAVENOUS | Status: DC
  Administered 2018-06-24: 1000 mg via INTRAVENOUS
  Filled 2018-06-24 (×2): qty 200

## 2018-06-24 MED ORDER — BISACODYL 5 MG PO TBEC: 5.0000 mg | DELAYED_RELEASE_TABLET | Freq: Every day | ORAL | Status: DC | PRN

## 2018-06-24 MED ORDER — RIVAROXABAN 15 MG PO TABS
15.0000 mg | ORAL_TABLET | Freq: Every day | ORAL | Status: DC
  Administered 2018-06-24 – 2018-06-26 (×3): 15 mg via ORAL
  Filled 2018-06-24 (×4): qty 1

## 2018-06-24 MED ORDER — SODIUM CHLORIDE 0.9 % IV SOLN
2.0000 g | Freq: Two times a day (BID) | INTRAVENOUS | Status: DC
  Administered 2018-06-24 – 2018-06-25 (×3): 2 g via INTRAVENOUS
  Filled 2018-06-24 (×4): qty 2

## 2018-06-24 MED ORDER — METOPROLOL TARTRATE 25 MG PO TABS
25.0000 mg | ORAL_TABLET | Freq: Two times a day (BID) | ORAL | Status: DC
  Administered 2018-06-24 – 2018-06-27 (×8): 25 mg via ORAL
  Filled 2018-06-24 (×8): qty 1

## 2018-06-24 MED ORDER — ATORVASTATIN CALCIUM 20 MG PO TABS
20.0000 mg | ORAL_TABLET | Freq: Every day | ORAL | Status: DC
  Administered 2018-06-24 – 2018-06-26 (×4): 20 mg via ORAL
  Filled 2018-06-24 (×4): qty 1

## 2018-06-24 MED ORDER — ONDANSETRON HCL 4 MG/2ML IJ SOLN: 4.0000 mg | Freq: Four times a day (QID) | INTRAMUSCULAR | Status: DC | PRN

## 2018-06-24 MED ORDER — VITAMIN D 1000 UNITS PO TABS
1000.0000 [IU] | ORAL_TABLET | Freq: Every day | ORAL | Status: DC
  Administered 2018-06-24 – 2018-06-27 (×4): 1000 [IU] via ORAL
  Filled 2018-06-24 (×4): qty 1

## 2018-06-24 MED ORDER — TRAZODONE HCL 50 MG PO TABS
25.0000 mg | ORAL_TABLET | Freq: Every evening | ORAL | Status: DC | PRN
  Administered 2018-06-24 – 2018-06-26 (×3): 25 mg via ORAL
  Filled 2018-06-24 (×3): qty 1

## 2018-06-24 NOTE — Progress Notes (Signed)
OT Cancellation Note  Patient Details Name: Jennifer Klein MRN: [id number] DOB: [birth year]   Cancelled Treatment:    Reason Eval/Treat Not Completed: Other (comment). Order received, chart reviewed. Pt alert, in bed, turns head in response to OT's voice. When asked in casual conversational context, pt states "I feel good." Pt demonstrating no visual signs of distress. Pt unintelligible otherwise when asked about pain, thirst, hunger, and unable to follow simple commands despite efforts. No caregiver or family present to assist with PLOF/home environment. Will re-attempt at later date/time as appropriate.   Jeni Salles, MPH, MS, OTR/L ascom [phone] 06/24/18, 10:15 AM

## 2018-06-24 NOTE — Progress Notes (Signed)
Pharmacy Antibiotic Note  Jennifer Klein is a 77 y.o. female admitted on 06/23/2018 with sepsis.  Pharmacy has been consulted for vanc/cefepime dosing. Patient received vanc 1g and cefepime 2g IV x 1 in ED.  Plan: Will continue vanc 1g IV q24h w/ 8 hour stack  Will draw vanc trough 07/09 @ 0100 prior to 4th dose Will continue cefepime 2g IV q24h  Ke 0.0288 T1/2 24 hrs Goal trough 15 - 20 mcg/mL  Height: 5\' 6"  (167.6 cm) Weight: 153 lb 3.5 oz (69.5 kg) IBW/kg (Calculated) : 59.3  Temp (24hrs), Avg:100 F (37.8 C), Min:98.1 F (36.7 C), Max:102.7 F (39.3 C)  Recent Labs  Lab 06/23/18 1706 06/23/18 2020  WBC 9.3  --   CREATININE 1.47*  --   LATICACIDVEN 1.9 1.2    Estimated Creatinine Clearance: 29.5 mL/min (A) (by C-G formula based on SCr of 1.47 mg/dL (H)).    Allergies  Allergen Reactions  . Darvon [Propoxyphene Hcl]     As a child    Thank you for allowing pharmacy to be a part of this patient's care.  Tobie Lords, PharmD, BCPS Clinical Pharmacist 06/24/2018

## 2018-06-24 NOTE — Clinical Social Work Note (Signed)
CSW is aware that patient is a resident of [facility]. CSW has left message for [name] at [facility] regarding if patient can return when time for discharge. CSW has also left message for patient's husband: Sharolyn Weber: [phone]. Shela Leff MSW,LCSW [phone]

## 2018-06-24 NOTE — Progress Notes (Signed)
Pharmacy Antibiotic Note  Jennifer Klein is a 77 y.o. female admitted on 06/23/2018 with sepsis.  Pharmacy has been consulted for vanc/cefepime dosing. Patient received vanc 1g and cefepime 2g IV x 1 in ED.  Plan: Current orders for cefepime 2g IV q24h. Improvement in renal function, will increase to cefepime 2g IV q12h    Height: 5\' 6"  (167.6 cm) Weight: 153 lb 3.5 oz (69.5 kg) IBW/kg (Calculated) : 59.3  Temp (24hrs), Avg:99.4 F (37.4 C), Min:97.7 F (36.5 C), Max:102.7 F (39.3 C)  Recent Labs  Lab 06/23/18 1706 06/23/18 2020 06/24/18 0500  WBC 9.3  --  5.5  CREATININE 1.47*  --  1.03*  LATICACIDVEN 1.9 1.2  --     Estimated Creatinine Clearance: 42.1 mL/min (A) (by C-G formula based on SCr of 1.03 mg/dL (H)).    Allergies  Allergen Reactions  . Darvon [Propoxyphene Hcl]     As a child   Vanc 7/4>>7/5 Cefepime 7/4>>  MRSA PCR neg UCx sent BCx x2 NGTD  Thank you for allowing pharmacy to be a part of this patient's care.  Rayna Sexton, PharmD, BCPS Clinical Pharmacist 06/24/2018 9:58 AM

## 2018-06-24 NOTE — Evaluation (Signed)
Physical Therapy Evaluation Patient Details Name: Jennifer Klein MRN: [id number] DOB: Dec 14, 1941 Today's Date: 06/24/2018   History of Present Illness  presented to ER secondary to fever, generalized weakness; admitted for management of sepsis (unknown origin)  Clinical Impression  Upon evaluation, patient alert.  Unable to answer orientation questions, but acknowledges name as "Jennifer Klein".  Very echolalic in verbal responses; unable to answer questions or maintain meaningful conversation.  Inconsistently follows simple commands, often requiring hand-over-hand assist to guide and initiate movement throughout session.  Currently requiring mod assist +1 for bed mobility; mod assist +2 for sit/stand, basic transfers and very short-distance gait (5') with bilat HHA.  Very heavy posterior trunk lean/weight shift with all standing attempts, absent balance/righting reactions (attempts spontaneous sitting with any perceived balance disturbance).  Recommend chair follow for any future attempts at gait for optimal safety. Would benefit from skilled PT to address above deficits and promote optimal return to PLOF; recommend transition to STR upon discharge from acute hospitalization (pending ability to actively participate).     Follow Up Recommendations SNF    Equipment Recommendations       Recommendations for Other Services       Precautions / Restrictions Precautions Precautions: Fall Restrictions Weight Bearing Restrictions: No      Mobility  Bed Mobility Overal bed mobility: Needs Assistance Bed Mobility: Supine to Sit;Sit to Supine     Supine to sit: Mod assist Sit to supine: Mod assist      Transfers Overall transfer level: Needs assistance Equipment used: Rolling walker (2 wheeled) Transfers: Sit to/from Stand Sit to Stand: Mod assist;+2 physical assistance         General transfer comment: hand-over-hand and manual faciltiation for weight shift, movement  initiation  Ambulation/Gait Ambulation/Gait assistance: Mod assist;+2 physical assistance Gait Distance (Feet): 5 Feet Assistive device: 2 person hand held assist       General Gait Details: SPT from bed/BSC.  Heavy posterior trunk lean, absent spontaneous righting; very fearful of falling/LOB (tends to spontaneously sit vs correct).  Very short, shuffling steps; tends to slide feet vs step and requries UE support at all times to guide movement  Stairs            Wheelchair Mobility    Modified Rankin (Stroke Patients Only)       Balance Overall balance assessment: Needs assistance Sitting-balance support: No upper extremity supported;Feet supported Sitting balance-Leahy Scale: Fair     Standing balance support: Bilateral upper extremity supported Standing balance-Leahy Scale: Poor                               Pertinent Vitals/Pain Pain Assessment: Faces Faces Pain Scale: No hurt    Home Living Family/patient expects to be discharged to:: [facility] ALF/memory care)                      Prior Function Level of Independence: Needs assistance         Comments: Patient unable to provide full history or PLOf.  Per caregiver at facility, functional status fluctuates-generally ambulatory without assist device, able to complete simple ADLs and self-feed without assist.  Does intermittently require assist for ADLs or mobility on "off days".  Able to converse with staff and make needs known.     Hand Dominance        Extremity/Trunk Assessment   Upper Extremity Assessment Upper Extremity Assessment: Generalized weakness  Lower Extremity Assessment Lower Extremity Assessment: Generalized weakness(grossly at least 4-/5 throughout)       Communication   Communication: (very echolalic in responses; unable to maintain conversation or make needs known during session)  Cognition Arousal/Alertness: Awake/alert Behavior During Therapy:  WFL for tasks assessed/performed Overall Cognitive Status: Difficult to assess                                 General Comments: intermittent follows simple commands with hand-over-hand; slightly resistive, agitated (slapping, pinching if afraid/unsure of movement pattern)      General Comments      Exercises     Assessment/Plan    PT Assessment Patient needs continued PT services  PT Problem List Decreased activity tolerance;Decreased balance;Decreased mobility;Decreased coordination;Decreased cognition;Decreased knowledge of use of DME;Decreased safety awareness;Decreased knowledge of precautions       PT Treatment Interventions DME instruction;Gait training;Functional mobility training;Therapeutic activities;Therapeutic exercise;Balance training;Patient/family education    PT Goals (Current goals can be found in the Care Plan section)  Acute Rehab PT Goals PT Goal Formulation: Patient unable to participate in goal setting Time For Goal Achievement: 07/08/18 Potential to Achieve Goals: Fair    Frequency Min 2X/week   Barriers to discharge Decreased caregiver support      Co-evaluation PT/OT/SLP Co-Evaluation/Treatment: Yes Reason for Co-Treatment: Complexity of the patient's impairments (multi-system involvement);Necessary to address cognition/behavior during functional activity;To address functional/ADL transfers PT goals addressed during session: Mobility/safety with mobility;Balance OT goals addressed during session: ADL's and self-care       AM-PAC PT "6 Clicks" Daily Activity  Outcome Measure Difficulty turning over in bed (including adjusting bedclothes, sheets and blankets)?: Unable Difficulty moving from lying on back to sitting on the side of the bed? : Unable Difficulty sitting down on and standing up from a chair with arms (e.g., wheelchair, bedside commode, etc,.)?: Unable Help needed moving to and from a bed to chair (including a  wheelchair)?: A Lot Help needed walking in hospital room?: A Lot Help needed climbing 3-5 steps with a railing? : Total 6 Click Score: 8    End of Session Equipment Utilized During Treatment: Gait belt Activity Tolerance: Patient tolerated treatment well Patient left: in bed;with bed alarm set;with call bell/phone within reach Nurse Communication: Mobility status PT Visit Diagnosis: Unsteadiness on feet (R26.81);Difficulty in walking, not elsewhere classified (R26.2)    Time: 1410-1437 PT Time Calculation (min) (ACUTE ONLY): 27 min   Charges:   PT Evaluation $PT Eval Moderate Complexity: 1 Mod     PT G Codes:        Ottis Vacha H. Owens Shark, PT, DPT, NCS 06/24/18, 3:14 PM [phone]

## 2018-06-24 NOTE — Evaluation (Signed)
Occupational Therapy Evaluation Patient Details Name: Jennifer Klein MRN: [id number] DOB: [birth year] Today's Date: 06/24/2018    History of Present Illness 77yo female pt presented to ER secondary to fever, generalized weakness; admitted for management of sepsis (unknown origin).   Clinical Impression   Pt seen for OT evaluation this date. Pt alert and oriented to self. Very echolalic in verbal responses; unable to answer questions or maintain meaningful conversation. Inconsistently follows simple commands, often requiring hand-over-hand assist to guide and initiate movement t/o session. Mod A +1 for bed mobility, Mod A +2 for basic transfers and short ambulation with bilat HHA. Very heavy posterior trunk lean/weight shift with all standing attempts, absent balance/righting reactions (attempts spontaneous sitting with any perceived balance disturbance.) Pt requires max assist with all ADL tasks this date. Would benefit from skilled OT services to address noted impairments and functional deficits in order to maximize return to PLOF, minimize falls, and minimize caregiver burden or need for higher level of care. Recommend transition to STR upon discharge from acute hospitalization (pending ability to actively participate).     Follow Up Recommendations  SNF    Equipment Recommendations  Other (comment)(TBD)    Recommendations for Other Services       Precautions / Restrictions Precautions Precautions: Fall Restrictions Weight Bearing Restrictions: No      Mobility Bed Mobility Overal bed mobility: Needs Assistance Bed Mobility: Supine to Sit;Sit to Supine     Supine to sit: Mod assist Sit to supine: Mod assist      Transfers Overall transfer level: Needs assistance Equipment used: Rolling walker (2 wheeled) Transfers: Sit to/from Stand Sit to Stand: Mod assist;+2 physical assistance         General transfer comment: hand-over-hand and manual faciltiation for weight  shift, movement initiation    Balance Overall balance assessment: Needs assistance Sitting-balance support: No upper extremity supported;Feet supported Sitting balance-Leahy Scale: Fair     Standing balance support: Bilateral upper extremity supported Standing balance-Leahy Scale: Poor                             ADL either performed or assessed with clinical judgement   ADL Overall ADL's : Needs assistance/impaired Eating/Feeding: Bed level;Maximal assistance   Grooming: Bed level;Maximal assistance   Upper Body Bathing: Sitting;Maximal assistance   Lower Body Bathing: Sit to/from stand;Maximal assistance   Upper Body Dressing : Sitting;Maximal assistance   Lower Body Dressing: Sit to/from stand;Maximal assistance;Bed level;Total assistance Lower Body Dressing Details (indicate cue type and reason): doffed brief while in standing requiring max assist to total assist as well as for donning clean brief once in bed rolling side to side in bed Toilet Transfer: BSC;Moderate assistance;+2 for physical assistance;+2 for safety/equipment Toilet Transfer Details (indicate cue type and reason): +2 HHA Toileting- Clothing Manipulation and Hygiene: Maximal assistance;Sit to/from stand         General ADL Comments: increased assist required as compared to baseline, 2/2 in part to increased confusion/cognitive deficits this date     Vision Baseline Vision/History: (pt unable to verbalize) Patient Visual Report: (pt unable to verbalize) Vision Assessment?: No apparent visual deficits     Perception     Praxis      Pertinent Vitals/Pain Pain Assessment: Faces Faces Pain Scale: No hurt Pain Intervention(s): Monitored during session     Hand Dominance     Extremity/Trunk Assessment Upper Extremity Assessment Upper Extremity Assessment: Generalized weakness(grossly at least 4-/5  bilaterally, Raynaud's in PMHx, fingers cold and slightly blue)   Lower Extremity  Assessment Lower Extremity Assessment: Generalized weakness(grossly at least 4-/5 throughout)       Communication Communication Communication: (very echolalic in responses; unable to maintain conversation or make needs known during session)   Cognition Arousal/Alertness: Awake/alert Behavior During Therapy: WFL for tasks assessed/performed Overall Cognitive Status: Difficult to assess                                 General Comments: hx of advanced dementia at baseline, based on facility caregiver report of PLOF pt appears to more confused; intermittent follows simple commands with hand-over-hand; slightly resistive, agitated (slapping, pinching if afraid/unsure of movement pattern)   General Comments       Exercises     Shoulder Instructions      Home Living Family/patient expects to be discharged to:: [facility] ALF/memory care)                                        Prior Functioning/Environment Level of Independence: Needs assistance        Comments: Patient unable to provide full history or PLOf.  Per caregiver at facility, functional status fluctuates-generally ambulatory without assist device, able to complete simple ADLs and self-feed without assist.  Does intermittently require assist for ADLs or mobility on "off days".  Able to converse with staff and make needs known.        OT Problem List: Decreased cognition;Decreased activity tolerance;Decreased safety awareness;Impaired balance (sitting and/or standing);Decreased strength      OT Treatment/Interventions: Self-care/ADL training;Balance training;Therapeutic exercise;Therapeutic activities;DME and/or AE instruction;Patient/family education;Cognitive remediation/compensation    OT Goals(Current goals can be found in the care plan section) Acute Rehab OT Goals OT Goal Formulation: Patient unable to participate in goal setting Time For Goal Achievement: 07/08/18 Potential to  Achieve Goals: Good ADL Goals Pt Will Transfer to Toilet: with min assist;ambulating(min A +1, LRAD for amb, elevated commode w/ rails) Additional ADL Goal #1: Pt will perform UB and LB dressing tasks with baseline caregiver level of assist.  OT Frequency: Min 1X/week   Barriers to D/C:            Co-evaluation   Reason for Co-Treatment: Complexity of the patient's impairments (multi-system involvement);Necessary to address cognition/behavior during functional activity;To address functional/ADL transfers PT goals addressed during session: Mobility/safety with mobility;Balance OT goals addressed during session: ADL's and self-care      AM-PAC PT "6 Clicks" Daily Activity     Outcome Measure Help from another person eating meals?: A Lot Help from another person taking care of personal grooming?: A Lot Help from another person toileting, which includes using toliet, bedpan, or urinal?: A Lot Help from another person bathing (including washing, rinsing, drying)?: A Lot Help from another person to put on and taking off regular upper body clothing?: A Lot Help from another person to put on and taking off regular lower body clothing?: A Lot 6 Click Score: 12   End of Session Equipment Utilized During Treatment: Gait belt Nurse Communication: Other (comment)(requested a busy apron for pt)  Activity Tolerance: Patient tolerated treatment well Patient left: in bed;with call bell/phone within reach;with bed alarm set;with SCD's reapplied  OT Visit Diagnosis: Other abnormalities of gait and mobility (R26.89);Muscle weakness (generalized) (M62.81);Other symptoms and signs involving  cognitive function                Time: 1410-1437 OT Time Calculation (min): 27 min Charges:  OT General Charges $OT Visit: 1 Visit OT Evaluation $OT Eval Moderate Complexity: 1 Mod  Jeni Salles, MPH, MS, OTR/L ascom [phone] 06/24/18, 4:01 PM

## 2018-06-24 NOTE — Progress Notes (Signed)
[facility] [place] NAME: Jennifer Klein    MR#:  [id number]  DATE OF BIRTH:  01-09-1941  SUBJECTIVE:  CHIEF COMPLAINT:   Chief Complaint  Patient presents with  . Fever   Pt demented at BL, no complaints REVIEW OF SYSTEMS:  CONSTITUTIONAL: No fever, fatigue or weakness.  EYES: No blurred or double vision.  EARS, NOSE, AND THROAT: No tinnitus or ear pain.  RESPIRATORY: No cough, shortness of breath, wheezing or hemoptysis.  CARDIOVASCULAR: No chest pain, orthopnea, edema.  GASTROINTESTINAL: No nausea, vomiting, diarrhea or abdominal pain.  GENITOURINARY: No dysuria, hematuria.  ENDOCRINE: No polyuria, nocturia,  HEMATOLOGY: No anemia, easy bruising or bleeding SKIN: No rash or lesion. MUSCULOSKELETAL: No joint pain or arthritis.   NEUROLOGIC: No tingling, numbness, weakness.  PSYCHIATRY: No anxiety or depression.   ROS  DRUG ALLERGIES:   Allergies  Allergen Reactions  . Darvon [Propoxyphene Hcl]     As a child    VITALS:  Blood pressure (!) 107/56, pulse 64, temperature 97.7 F (36.5 C), temperature source Oral, resp. rate 16, height 5\' 6"  (1.676 m), weight 69.5 kg (153 lb 3.5 oz), SpO2 99 %.  PHYSICAL EXAMINATION:  GENERAL:  77 y.o.-year-old patient lying in the bed with no acute distress.  EYES: Pupils equal, round, reactive to light and accommodation. No scleral icterus. Extraocular muscles intact.  HEENT: Head atraumatic, normocephalic. Oropharynx and nasopharynx clear.  NECK:  Supple, no jugular venous distention. No thyroid enlargement, no tenderness.  LUNGS: Normal breath sounds bilaterally, no wheezing, rales,rhonchi or crepitation. No use of accessory muscles of respiration.  CARDIOVASCULAR: S1, S2 normal. No murmurs, rubs, or gallops.  ABDOMEN: Soft, nontender, nondistended. Bowel sounds present. No organomegaly or mass.  EXTREMITIES: No pedal edema, cyanosis, or clubbing.  NEUROLOGIC: Cranial nerves II through XII  are intact. Muscle strength 5/5 in all extremities. Sensation intact. Gait not checked.  PSYCHIATRIC: The patient is alert and oriented x 3.  SKIN: No obvious rash, lesion, or ulcer.   Physical Exam LABORATORY PANEL:   CBC Recent Labs  Lab 06/24/18 0500  WBC 5.5  HGB 11.9*  HCT 35.0  PLT 149*   ------------------------------------------------------------------------------------------------------------------  Chemistries  Recent Labs  Lab 06/23/18 1706 06/24/18 0500  NA 136 135  K 4.3 3.2*  CL 106 111  CO2 21* 19*  GLUCOSE 123* 100*  BUN 24* 18  CREATININE 1.47* 1.03*  CALCIUM 8.6* 7.5*  MG  --  1.9  AST 37  --   ALT 17  --   ALKPHOS 106  --   BILITOT 0.8  --    ------------------------------------------------------------------------------------------------------------------  Cardiac Enzymes No results for input(s): TROPONINI in the last 168 hours. ------------------------------------------------------------------------------------------------------------------  RADIOLOGY:  Ct Abdomen Pelvis W Contrast  Result Date: 06/23/2018 CLINICAL DATA:  77 year old female with acute abdominal pain and fever. EXAM: CT ABDOMEN AND PELVIS WITH CONTRAST TECHNIQUE: Multidetector CT imaging of the abdomen and pelvis was performed using the standard protocol following bolus administration of intravenous contrast. CONTRAST:  10mL OMNIPAQUE IOHEXOL 300 MG/ML  SOLN COMPARISON:  None. FINDINGS: Lower chest: No acute abnormality. Hepatobiliary: The liver is unremarkable. Small amount of gallbladder sludge versus gallstones noted. No CT evidence of acute cholecystitis. No biliary dilatation. Pancreas: Unremarkable Spleen: Unremarkable Adrenals/Urinary Tract: The kidneys, adrenal glands and bladder are unremarkable. Stomach/Bowel: Stomach is within normal limits. No evidence of bowel wall thickening, distention, or inflammatory changes. Colonic diverticulosis noted without evidence of  diverticulitis. Vascular/Lymphatic: Aortic atherosclerosis.  No enlarged abdominal or pelvic lymph nodes. Reproductive: Uterus and bilateral adnexa are unremarkable. Other: No free fluid, abscess or pneumoperitoneum. Musculoskeletal: No acute or suspicious bony abnormalities. Degenerative changes within the lumbar spine are noted. IMPRESSION: 1. No acute abnormality 2. Small amount of gallbladder sludge versus cholelithiasis. No CT evidence of acute cholecystitis. 3.  Aortic Atherosclerosis (ICD10-I70.0). Electronically Signed   By: Margarette Canada M.D.   On: 06/23/2018 20:46   Dg Chest Portable 1 View  Result Date: 06/23/2018 CLINICAL DATA:  Altered mental status. EXAM: PORTABLE CHEST 1 VIEW COMPARISON:  01/12/2018 FINDINGS: Patient slightly rotated to the left. Lungs are adequately inflated and otherwise clear. Cardiomediastinal silhouette and remainder of the exam is unchanged. IMPRESSION: No active disease. Electronically Signed   By: Marin Olp M.D.   On: 06/23/2018 17:52    ASSESSMENT AND PLAN:  *Acute Sepsis of unknown origin Resoled Continue sepsis protocol, IVFs, IV cefepime, discontinue vancomycin, follow-up on cultures   *Atrial fibrillation with rapid ventricular response likely precipitated by infection Resolved Continue Cardizem and Xarelto  *Acute renal failure Resolved likely prerenal, secondary to poor p.o. Intake Treated with IVFs  *Advanced dementia Stable Increase nursing care PRN, aspiration/fall precautions while in house, PT/OT to evaluate/treat   Disposition back to nursing home in 1 to 2 days barring any complications   All the records are reviewed and case discussed with Care Management/Social Workerr. Management plans discussed with the patient, family and they are in agreement.  CODE STATUS: full  TOTAL TIME TAKING CARE OF THIS PATIENT: 35 minutes.     POSSIBLE D/C IN 1-2 DAYS, DEPENDING ON CLINICAL CONDITION.   Avel Peace Nikyla Navedo M.D on 06/24/2018    Between 7am to 6pm - Pager - [phone]  After 6pm go to [email or web address] - password [email or web address] [facility]  Office  [phone]  CC: Primary care physician; Arnetha Courser, MD  Note: This dictation was prepared with Dragon dictation along with smaller phrase technology. Any transcriptional errors that result from this process are unintentional.

## 2018-06-24 NOTE — Progress Notes (Signed)
PT Cancellation Note  Patient Details Name: Jennifer Klein MRN: [id number] DOB: 1941/04/07   Cancelled Treatment:    Reason Eval/Treat Not Completed: Patient's level of consciousness. Chart reviewed, RN consulted: pt received supine in bed, awake, appears to be reasting comfortably. Pt engaged verbally, but not verbally or gesturaly responsive. Pt not following simple commands despite multiple attempts. Aware of advanced dementia history, but no caregiver in room to provide details on baseline cognition or mobility. Will attempt again at later date/time as able once pt better able to participate with therapy.   9:29 AM, 06/24/18 Etta Grandchild, PT, DPT Physical Therapist - [facility]  [phone] ([place])    Chaunta Bejarano C 06/24/2018, 9:29 AM

## 2018-06-25 LAB — CBC
HCT: 39.9 % (ref 35.0–47.0)
Hemoglobin: 13.5 g/dL (ref 12.0–16.0)
MCH: 30.2 pg (ref 26.0–34.0)
MCHC: 33.8 g/dL (ref 32.0–36.0)
MCV: 89.3 fL (ref 80.0–100.0)
Platelets: 158 K/uL (ref 150–440)
RBC: 4.46 MIL/uL (ref 3.80–5.20)
RDW: 14.6 % — ABNORMAL HIGH (ref 11.5–14.5)
WBC: 6.1 K/uL (ref 3.6–11.0)

## 2018-06-25 LAB — GLUCOSE, CAPILLARY: GLUCOSE-CAPILLARY: 88 mg/dL (ref 70–99)

## 2018-06-25 LAB — PREALBUMIN: Prealbumin: 15.4 mg/dL — ABNORMAL LOW (ref 18–38)

## 2018-06-25 MED ORDER — MEGESTROL ACETATE 400 MG/10ML PO SUSP
400.0000 mg | Freq: Two times a day (BID) | ORAL | Status: DC
Start: 2018-06-25 — End: 2018-06-27
  Administered 2018-06-25 – 2018-06-27 (×5): 400 mg via ORAL
  Filled 2018-06-25 (×6): qty 10

## 2018-06-25 MED ORDER — SODIUM CHLORIDE 0.9 % IV SOLN
1.0000 g | Freq: Two times a day (BID) | INTRAVENOUS | Status: DC
  Administered 2018-06-25 – 2018-06-27 (×4): 1 g via INTRAVENOUS
  Filled 2018-06-25 (×6): qty 1

## 2018-06-25 MED ORDER — CEFDINIR 300 MG PO CAPS
300.0000 mg | ORAL_CAPSULE | Freq: Two times a day (BID) | ORAL | Status: DC
  Filled 2018-06-25 (×2): qty 1

## 2018-06-25 NOTE — Progress Notes (Signed)
Patient is not eating well attempted to feed her and she started crying did not comprehend to bite the bagel and turned her head away when trying to give her eggs. She did take the applesauce with meds mixed in, but upon the last spoon that had the colace capsule in it once she felt it in her mouth she spit it out and would not open her mouth to be given the remaining apple sauce.

## 2018-06-25 NOTE — Progress Notes (Addendum)
[facility] [place] NAME: Laquinda Moller    MR#:  [id number]  DATE OF BIRTH:  [birth year]  SUBJECTIVE:  CHIEF COMPLAINT:   Chief Complaint  Patient presents with  . Fever  Patient is confused and disoriented per baseline dementia  REVIEW OF SYSTEMS:  CONSTITUTIONAL: No fever, fatigue or weakness.  EYES: No blurred or double vision.  EARS, NOSE, AND THROAT: No tinnitus or ear pain.  RESPIRATORY: No cough, shortness of breath, wheezing or hemoptysis.  CARDIOVASCULAR: No chest pain, orthopnea, edema.  GASTROINTESTINAL: No nausea, vomiting, diarrhea or abdominal pain.  GENITOURINARY: No dysuria, hematuria.  ENDOCRINE: No polyuria, nocturia,  HEMATOLOGY: No anemia, easy bruising or bleeding SKIN: No rash or lesion. MUSCULOSKELETAL: No joint pain or arthritis.   NEUROLOGIC: No tingling, numbness, weakness.  PSYCHIATRY: No anxiety or depression.   ROS  DRUG ALLERGIES:   Allergies  Allergen Reactions  . Darvon [Propoxyphene Hcl]     As a child    VITALS:  Blood pressure 121/81, pulse 75, temperature 98.3 F (36.8 C), temperature source Oral, resp. rate 20, height 5\' 6"  (1.676 m), weight 72.3 kg (159 lb 4.8 oz), SpO2 99 %.  PHYSICAL EXAMINATION:  GENERAL:  77 y.o.-year-old patient lying in the bed with no acute distress.  EYES: Pupils equal, round, reactive to light and accommodation. No scleral icterus. Extraocular muscles intact.  HEENT: Head atraumatic, normocephalic. Oropharynx and nasopharynx clear.  NECK:  Supple, no jugular venous distention. No thyroid enlargement, no tenderness.  LUNGS: Normal breath sounds bilaterally, no wheezing, rales,rhonchi or crepitation. No use of accessory muscles of respiration.  CARDIOVASCULAR: S1, S2 normal. No murmurs, rubs, or gallops.  ABDOMEN: Soft, nontender, nondistended. Bowel sounds present. No organomegaly or mass.  EXTREMITIES: No pedal edema, cyanosis, or clubbing.  NEUROLOGIC: Cranial  nerves II through XII are intact. Muscle strength 5/5 in all extremities. Sensation intact. Gait not checked.  PSYCHIATRIC: The patient is alert and oriented x 3.  SKIN: No obvious rash, lesion, or ulcer.   Physical Exam LABORATORY PANEL:   CBC Recent Labs  Lab 06/25/18 0531  WBC 6.1  HGB 13.5  HCT 39.9  PLT 158   ------------------------------------------------------------------------------------------------------------------  Chemistries  Recent Labs  Lab 06/23/18 1706 06/24/18 0500  NA 136 135  K 4.3 3.2*  CL 106 111  CO2 21* 19*  GLUCOSE 123* 100*  BUN 24* 18  CREATININE 1.47* 1.03*  CALCIUM 8.6* 7.5*  MG  --  1.9  AST 37  --   ALT 17  --   ALKPHOS 106  --   BILITOT 0.8  --    ------------------------------------------------------------------------------------------------------------------  Cardiac Enzymes No results for input(s): TROPONINI in the last 168 hours. ------------------------------------------------------------------------------------------------------------------  RADIOLOGY:  Ct Abdomen Pelvis W Contrast  Result Date: 06/23/2018 CLINICAL DATA:  77 year old female with acute abdominal pain and fever. EXAM: CT ABDOMEN AND PELVIS WITH CONTRAST TECHNIQUE: Multidetector CT imaging of the abdomen and pelvis was performed using the standard protocol following bolus administration of intravenous contrast. CONTRAST:  38mL OMNIPAQUE IOHEXOL 300 MG/ML  SOLN COMPARISON:  None. FINDINGS: Lower chest: No acute abnormality. Hepatobiliary: The liver is unremarkable. Small amount of gallbladder sludge versus gallstones noted. No CT evidence of acute cholecystitis. No biliary dilatation. Pancreas: Unremarkable Spleen: Unremarkable Adrenals/Urinary Tract: The kidneys, adrenal glands and bladder are unremarkable. Stomach/Bowel: Stomach is within normal limits. No evidence of bowel wall thickening, distention, or inflammatory changes. Colonic diverticulosis noted without  evidence of diverticulitis. Vascular/Lymphatic: Aortic  atherosclerosis. No enlarged abdominal or pelvic lymph nodes. Reproductive: Uterus and bilateral adnexa are unremarkable. Other: No free fluid, abscess or pneumoperitoneum. Musculoskeletal: No acute or suspicious bony abnormalities. Degenerative changes within the lumbar spine are noted. IMPRESSION: 1. No acute abnormality 2. Small amount of gallbladder sludge versus cholelithiasis. No CT evidence of acute cholecystitis. 3.  Aortic Atherosclerosis (ICD10-I70.0). Electronically Signed   By: Margarette Canada M.D.   On: 06/23/2018 20:46   Dg Chest Portable 1 View  Result Date: 06/23/2018 CLINICAL DATA:  Altered mental status. EXAM: PORTABLE CHEST 1 VIEW COMPARISON:  01/12/2018 FINDINGS: Patient slightly rotated to the left. Lungs are adequately inflated and otherwise clear. Cardiomediastinal silhouette and remainder of the exam is unchanged. IMPRESSION: No active disease. Electronically Signed   By: Marin Olp M.D.   On: 06/23/2018 17:52    ASSESSMENT AND PLAN:  *Acute Sepsis secondary to E. coli UTI  Resolved Treated on our sepsis protocol,provided IVFs for rehydration, continue empiric IV cefepime, urine culture noted for E. coli-follow-up on sensitivities, and continue close medical monitoring    *Acute E. coli UTI Plan of care as stated above  *Atrial fibrillation with rapid ventricular response Resolved likely precipitated by infection Continue Cardizem and Xarelto  *Acute renal failure Resolved likely prerenal, secondary to poor p.o. Intake Treated with IVFs  *Advanced dementia Stable Continue increased nursing care PRN, aspiration/fall precautions while in house, PT/OT to evaluate/treat   Disposition to nursing home on Monday in d/w SW   All the records are reviewed and case discussed with Care Management/Social Workerr. Management plans discussed with the patient, family and they are in agreement.  CODE STATUS:  full  TOTAL TIME TAKING CARE OF THIS PATIENT: 35 minutes.     POSSIBLE D/C IN 2 DAYS, DEPENDING ON CLINICAL CONDITION.   Avel Peace Mercadez Heitman M.D on 06/25/2018   Between 7am to 6pm - Pager - [phone]  After 6pm go to [email or web address] - password [email or web address] [place] [facility]  Office  [phone]  CC: Primary care physician; Arnetha Courser, MD  Note: This dictation was prepared with Dragon dictation along with smaller phrase technology. Any transcriptional errors that result from this process are unintentional.

## 2018-06-26 LAB — URINE CULTURE: Culture: >=100000 — AB

## 2018-06-26 LAB — GLUCOSE, CAPILLARY: GLUCOSE-CAPILLARY: 85 mg/dL (ref 70–99)

## 2018-06-26 MED ORDER — ORAL CARE MOUTH RINSE
15.0000 mL | Freq: Two times a day (BID) | OROMUCOSAL | Status: DC
  Administered 2018-06-26 – 2018-06-27 (×2): 15 mL via OROMUCOSAL

## 2018-06-26 NOTE — Progress Notes (Signed)
[facility] [place] NAME: Jennifer Klein    MR#:  [id number]  DATE OF BIRTH:  [birth year]  SUBJECTIVE:   Patient here due to altered mental status and noted to have urinary tract infection.  Remains somewhat confused.  REVIEW OF SYSTEMS:    Review of Systems  Unable to perform ROS: Dementia    Nutrition: Heart healthy Tolerating Diet: Yes Tolerating PT: Await Eval.   DRUG ALLERGIES:   Allergies  Allergen Reactions  . Darvon [Propoxyphene Hcl]     As a child    VITALS:  Blood pressure 113/75, pulse 81, temperature 98 F (36.7 C), temperature source Axillary, resp. rate 17, height 5\' 6"  (1.676 m), weight 69.1 kg (152 lb 5.4 oz), SpO2 100 %.  PHYSICAL EXAMINATION:   Physical Exam  GENERAL:  77 y.o.-year-old patient lying in bed confused but in NAD.  EYES: Pupils equal, round, reactive to light. No scleral icterus. Extraocular muscles intact.  HEENT: Head atraumatic, normocephalic. Oropharynx and nasopharynx clear.  NECK:  Supple, no jugular venous distention. No thyroid enlargement, no tenderness.  LUNGS: Normal breath sounds bilaterally, no wheezing, rales, rhonchi. No use of accessory muscles of respiration.  CARDIOVASCULAR: S1, S2 normal. No murmurs, rubs, or gallops.  ABDOMEN: Soft, nontender, nondistended. Bowel sounds present. No organomegaly or mass.  EXTREMITIES: No cyanosis, clubbing or edema b/l.    NEUROLOGIC: Cranial nerves II through XII are intact. No focal Motor or sensory deficits b/l.   PSYCHIATRIC: The patient is alert and oriented x 1.  SKIN: No obvious rash, lesion, or ulcer.    LABORATORY PANEL:   CBC Recent Labs  Lab 06/25/18 0531  WBC 6.1  HGB 13.5  HCT 39.9  PLT 158   ------------------------------------------------------------------------------------------------------------------  Chemistries  Recent Labs  Lab 06/23/18 1706 06/24/18 0500  NA 136 135  K 4.3 3.2*  CL 106 111  CO2 21* 19*   GLUCOSE 123* 100*  BUN 24* 18  CREATININE 1.47* 1.03*  CALCIUM 8.6* 7.5*  MG  --  1.9  AST 37  --   ALT 17  --   ALKPHOS 106  --   BILITOT 0.8  --    ------------------------------------------------------------------------------------------------------------------  Cardiac Enzymes No results for input(s): TROPONINI in the last 168 hours. ------------------------------------------------------------------------------------------------------------------  RADIOLOGY:  No results found.   ASSESSMENT AND PLAN:   77 year old female with past medical history of dementia, paroxysmal atrial fibrillation, hypertension, gout who presented to the hospital due to altered mental status and noted to have urinary tract infection.  1.  Altered mental status- due to advanced dementia combined with underlying UTI. -Continue IV ceftriaxone for the UTI and follow mental status.  2.  Urinary tract infection-continue IV ceftriaxone, urine cultures positive for E. coli which is sensitive to Ceftriaxone and will switch to Oral upon discharge.   3.  Acute renal failure-secondary to dehydration and also due to underlying UTI.  Improving with IV antibiotics and fluids.  We will continue to monitor.  4.  Atrial fibrillation with rapid ventricular response-now resolved. -Heart rates are stable.  Continue metoprolol.  Continue Xarelto.  5.  Dementia-continue Aricept, Namenda.  6.  Anxiety- continue BuSpar.  7.  GERD-continue Protonix.  8. Hyperlipidemia - cont. Atorvastatin.     All the records are reviewed and case discussed with Care Management/Social Worker. Management plans discussed with the patient, family and they are in agreement.  CODE STATUS: Full code  DVT Prophylaxis: Xarelto  TOTAL TIME TAKING  CARE OF THIS PATIENT: 30 minutes.   POSSIBLE D/C IN 1-2 DAYS, DEPENDING ON CLINICAL CONDITION.   Henreitta Leber M.D on 06/26/2018 at 1:27 PM  Between 7am to 6pm - Pager -  [phone]  After 6pm go to [email or web address] - Proofreader  [facility] [place] [facility]  Office  [phone]  CC: Primary care physician; Arnetha Courser, MD

## 2018-06-26 NOTE — NC FL2 (Signed)
[place] LEVEL OF CARE SCREENING TOOL     IDENTIFICATION  Patient Name: Jennifer Klein Birthdate: [birth year] Sex: female Admission Date (Current Location): 06/23/2018  [place] and [place] Number:  Engineering geologist and Address:  [facility], [address] [place], [place] [address]      Provider Number: [id number]  Attending Physician Name and Address:  Henreitta Leber, MD  Relative Name and Phone Number:  Kashawn Manzano [facility]) [phone] or Doreene Eland (Daughter) [phone]    Current Level of Care: Hospital Recommended Level of Care: [place] Prior Approval Number:    Date Approved/Denied: 06/26/18 PASRR Number: [id number]  Discharge Plan: SNF    Current Diagnoses: Patient Active Problem List   Diagnosis Date Noted  . Sepsis ([place]) 06/23/2018  . Fall 09/10/2017  . PAD (peripheral artery disease) ([place]) 06/22/2017  . Chronic venous insufficiency 06/22/2017  . CKD (chronic kidney disease) stage 3, GFR 30-59 ml/min (HCC) 09/04/2016  . B12 deficiency 05/06/2016  . Hyperlipidemia LDL goal <100 05/05/2016  . Alzheimer's dementia without behavioral disturbance 05/04/2016  . Medication monitoring encounter 05/04/2016  . Barrett's esophagus 11/09/2015  . Paroxysmal atrial fibrillation ([place]) 08/07/2013  . Osteopenia 08/08/2012  . Hypertension 06/03/2012  . Gout 06/03/2012    Orientation RESPIRATION BLADDER Height & Weight     Self  Normal Incontinent Weight: 152 lb 5.4 oz (69.1 kg) Height:  5\' 6"  (167.6 cm)  BEHAVIORAL SYMPTOMS/MOOD NEUROLOGICAL BOWEL NUTRITION STATUS      Incontinent    AMBULATORY STATUS COMMUNICATION OF NEEDS Skin   Extensive Assist Verbally Normal                       Personal Care Assistance Level of Assistance  Bathing, Feeding, Dressing Bathing Assistance: Limited assistance Feeding assistance: Limited assistance Dressing Assistance: Limited assistance     Functional  Limitations Info  Sight, Hearing, Speech Sight Info: Adequate Hearing Info: Adequate Speech Info: Adequate    SPECIAL CARE FACTORS FREQUENCY  PT (By licensed PT), OT (By licensed OT)     PT Frequency: Up to 5X per week OT Frequency: Up to 3X per week            Contractures Contractures Info: Not present    Additional Factors Info  Code Status, Allergies Code Status Info: Full Allergies Info: Darvon Propoxyphene Hcl           Current Medications (06/26/2018):  This is the current hospital active medication list Current Facility-Administered Medications  Medication Dose Route Frequency Provider Last Rate Last Dose  . acetaminophen (TYLENOL) tablet 650 mg  650 mg Oral Q6H PRN Amelia Jo, MD       Or  . acetaminophen (TYLENOL) suppository 650 mg  650 mg Rectal Q6H PRN Amelia Jo, MD      . atorvastatin (LIPITOR) tablet 20 mg  20 mg Oral QHS Amelia Jo, MD   20 mg at 06/25/18 2121  . benzonatate (TESSALON) capsule 200 mg  200 mg Oral TID PRN Amelia Jo, MD      . bisacodyl (DULCOLAX) EC tablet 5 mg  5 mg Oral Daily PRN Amelia Jo, MD      . busPIRone (BUSPAR) tablet 5 mg  5 mg Oral BID Amelia Jo, MD   5 mg at 06/26/18 1234  . ceFEPIme (MAXIPIME) 1 g in sodium chloride 0.9 % 100 mL IVPB  1 g Intravenous Q12H Salary, Montell D, MD 200 mL/hr at  06/26/18 1235 1 g at 06/26/18 1235  . cholecalciferol (VITAMIN D) tablet 1,000 Units  1,000 Units Oral Daily Amelia Jo, MD   1,000 Units at 06/26/18 1233  . docusate sodium (COLACE) capsule 100 mg  100 mg Oral BID Amelia Jo, MD   100 mg at 06/26/18 1233  . donepezil (ARICEPT) tablet 10 mg  10 mg Oral QHS Amelia Jo, MD   10 mg at 06/25/18 2122  . febuxostat (ULORIC) tablet 40 mg  40 mg Oral Daily Amelia Jo, MD   40 mg at 06/26/18 1235  . HYDROcodone-acetaminophen (NORCO/VICODIN) 5-325 MG per tablet 1-2 tablet  1-2 tablet Oral Q4H PRN Amelia Jo, MD   2 tablet at 06/24/18 0520  . megestrol (MEGACE) 400  MG/10ML suspension 400 mg  400 mg Oral BID Salary, [name] D, MD   400 mg at 06/26/18 1235  . memantine (NAMENDA) tablet 10 mg  10 mg Oral BID Amelia Jo, MD   10 mg at 06/26/18 1234  . metoprolol tartrate (LOPRESSOR) tablet 25 mg  25 mg Oral BID Amelia Jo, MD   25 mg at 06/26/18 1233  . ondansetron (ZOFRAN) tablet 4 mg  4 mg Oral Q6H PRN Amelia Jo, MD       Or  . ondansetron [facility]) injection 4 mg  4 mg Intravenous Q6H PRN Amelia Jo, MD      . pantoprazole (PROTONIX) EC tablet 40 mg  40 mg Oral Daily Amelia Jo, MD   40 mg at 06/26/18 1233  . Rivaroxaban (XARELTO) tablet 15 mg  15 mg Oral Q supper Amelia Jo, MD   15 mg at 06/25/18 1753  . traZODone (DESYREL) tablet 25 mg  25 mg Oral QHS PRN Amelia Jo, MD   25 mg at 06/25/18 2121     Discharge Medications: Please see discharge summary for a list of discharge medications.  Relevant Imaging Results:  Relevant Lab Results:   Additional Information SS# [id number]  Zettie Pho, LCSW

## 2018-06-26 NOTE — Progress Notes (Signed)
Initial Nutrition Assessment  DOCUMENTATION CODES:   Not applicable  INTERVENTION:  Recommend liberalizing diet to regular with thin liquids.  Provide Magic cup TID with meals, each supplement provides 290 kcal and 9 grams of protein.  Consider resuming home medication of vitamin B12 500 micrograms daily PO in setting of hx of vitamin B12 deficiency.  NUTRITION DIAGNOSIS:   Increased nutrient needs related to acute illness(sepsis) as evidenced by estimated needs.  GOAL:   Patient will meet greater than or equal to 90% of their needs  MONITOR:   PO intake, Supplement acceptance, Labs, Weight trends, I & O's  REASON FOR ASSESSMENT:   Consult Assessment of nutrition requirement/status  ASSESSMENT:   77 year old female with PMHx of advanced Alzheimer's dementia, HTN, gout, OP, HLD, vitamin B12 deficiency, paroxysmal A-fib, systolic dysfunction who is admitted from [facility] with acute sepsis secondary to E. coli UTI, A-fib with RVR, acute renal failure.   Met with patient at bedside. She is not a reliable historian in setting of dementia. She does report having a fairly good appetite and denies any difficulty with chewing or swallowing. Patient noted to have good dentition on NFPE. Spoke with RN from [employer] who cares for patient. She is typically on a regular diet with thin liquids and is usually independent with eating. She typically eats well at meals and does not currently have an order for an ONS at [facility]. Her intake of meals have been variable during admission. Ranges from 0% or bites/sips to 80%. She had finished about 75% of her lunch tray at time of RD assessment and had eaten a good portion of her chicken.  Per chart patient was 143.4 lbs on 09/06/2017, 148.9 lbs on 09/10/2017, 163.1 lbs on 01/12/2018, 169.8 lbs on 06/15/2018, and is currently 152.3 lbs. Though weight from yesterday is 159.3 lbs, so unsure of accuracy of current weight.  Medications  reviewed and include: vitamin D 1000 units daily, Colace, Megace 400 mg BID, pantoprazole, cefepime.  Labs reviewed: CBG 81-85, Potassium 3.2, CO2 19, Creatinine 1.03.  Patient does not meet criteria for malnutrition at this time.  NUTRITION - FOCUSED PHYSICAL EXAM:    Most Recent Value  Orbital Region  No depletion  Upper Arm Region  No depletion  Thoracic and Lumbar Region  No depletion  Buccal Region  No depletion  Temple Region  Mild depletion  Clavicle Bone Region  No depletion  Clavicle and Acromion Bone Region  No depletion  Scapular Bone Region  No depletion  Dorsal Hand  No depletion  Patellar Region  No depletion  Anterior Thigh Region  No depletion  Posterior Calf Region  No depletion  Edema (RD Assessment)  None  Hair  Reviewed  Eyes  Reviewed  Mouth  Reviewed  Skin  Reviewed  Nails  Reviewed     Diet Order:   Diet Order           Diet Heart Room service appropriate? Yes; Fluid consistency: Thin  Diet effective now          EDUCATION NEEDS:   No education needs have been identified at this time  Skin:  Skin Assessment: Reviewed RN Assessment  Last BM:  06/24/2018 - large type 7  Height:   Ht Readings from Last 1 Encounters:  06/23/18 '5\' 6"'$  (1.676 m)    Weight:   Wt Readings from Last 1 Encounters:  06/26/18 152 lb 5.4 oz (69.1 kg)    Ideal Body Weight:  59.1 kg  BMI:  Body mass index is 24.59 kg/m.  Estimated Nutritional Needs:   Kcal:  1430-1670 (MSJ x 1.2-1.4)  Protein:  80-90 grams (1.2-1.3 grams/kg)  Fluid:  1.7 L/day (25 mL/kg)  Willey Blade, MS, RD, LDN Office: [phone] Pager: [phone] After Hours/Weekend Pager: [phone]

## 2018-06-26 NOTE — Clinical Social Work Note (Signed)
Clinical Social Work Assessment  Patient Details  Name: Jennifer Klein MRN: [id number] Date of Birth: Nov 20, 1941  Date of referral:  06/26/18               Reason for consult:  Facility Placement                Permission sought to share information with:  Chartered certified accountant granted to share information::  Yes, Verbal Permission Granted  Name::        Agency::  [place] area SNFs  Relationship::     Contact Information:     Housing/Transportation Living arrangements for the past 2 months:  [place] of Information:  Medical Team, Adult Children Patient Interpreter Needed:  None Criminal Activity/Legal Involvement Pertinent to Current Situation/Hospitalization:  No - Comment as needed Significant Relationships:  Adult Children, Warehouse manager, Spouse Lives with:  Facility Resident Do you feel safe going back to the place where you live?  Yes Need for family participation in patient care:  Yes (Comment)(Patient has advanced dementia)  Care giving concerns:  Patient admitted from a memory care unit. PT recommendation for SNF   Social Worker assessment / plan:  The CSW attempted to meet with the patient and family at bedside. The patient was sleeping and no family was available. The CSW attempted to contact the patient's spouse with no answer. The CSW contacted the patient's daughter Jennifer Klein. Jennifer Klein explained that her father is out of town but is in agreement with SNF. The CSW explained the referral process and gave the list of SNFs verbally to [facility] by phone. The patient's spouse plans to contact the attending CSW tomorrow for bed offers and to make a choice.  At baseline, the patient is a resident of the memory care unit at [facility]. Currently, she would benefit from strengthening to return to [facility] post-SNF discharge. The CSW has begun the referral process and will continue to follow.  Employment status:  Retired Radiation protection practitioner:  [employer] PT Recommendations:  [place] / Referral to community resources:  [place]  Patient/Family's Response to care:  The patient's daughter thanked the CSW.  Patient/Family's Understanding of and Emotional Response to Diagnosis, Current Treatment, and Prognosis:  The patient's family seems to understand the discharge plan and agree with SNF at discharge.  Emotional Assessment Appearance:  Appears stated age Attitude/Demeanor/Rapport:  Lethargic Affect (typically observed):  Stable Orientation:  Oriented to Self Alcohol / Substance use:  Never Used Psych involvement (Current and /or in the community):  No (Comment)  Discharge Needs  Concerns to be addressed:  Care Coordination, Discharge Planning Concerns Readmission within the last 30 days:  No Current discharge risk:  Chronically ill, Cognitively Impaired Barriers to Discharge:  Continued Medical Work up   [employer], LCSW 06/26/2018, 1:41 PM

## 2018-06-27 DIAGNOSIS — G309 Alzheimer's disease, unspecified: Secondary | ICD-10-CM | POA: Diagnosis not present

## 2018-06-27 DIAGNOSIS — R4182 Altered mental status, unspecified: Secondary | ICD-10-CM | POA: Diagnosis not present

## 2018-06-27 DIAGNOSIS — A419 Sepsis, unspecified organism: Secondary | ICD-10-CM | POA: Diagnosis not present

## 2018-06-27 DIAGNOSIS — I4891 Unspecified atrial fibrillation: Secondary | ICD-10-CM | POA: Diagnosis not present

## 2018-06-27 DIAGNOSIS — N179 Acute kidney failure, unspecified: Secondary | ICD-10-CM | POA: Diagnosis not present

## 2018-06-27 DIAGNOSIS — Z7401 Bed confinement status: Secondary | ICD-10-CM | POA: Diagnosis not present

## 2018-06-27 DIAGNOSIS — M109 Gout, unspecified: Secondary | ICD-10-CM | POA: Diagnosis not present

## 2018-06-27 DIAGNOSIS — F0281 Dementia in other diseases classified elsewhere with behavioral disturbance: Secondary | ICD-10-CM | POA: Diagnosis not present

## 2018-06-27 DIAGNOSIS — B9620 Unspecified Escherichia coli [E. coli] as the cause of diseases classified elsewhere: Secondary | ICD-10-CM | POA: Diagnosis not present

## 2018-06-27 DIAGNOSIS — G9340 Encephalopathy, unspecified: Secondary | ICD-10-CM | POA: Diagnosis not present

## 2018-06-27 DIAGNOSIS — F0390 Unspecified dementia without behavioral disturbance: Secondary | ICD-10-CM | POA: Diagnosis not present

## 2018-06-27 DIAGNOSIS — N390 Urinary tract infection, site not specified: Secondary | ICD-10-CM | POA: Diagnosis not present

## 2018-06-27 DIAGNOSIS — K219 Gastro-esophageal reflux disease without esophagitis: Secondary | ICD-10-CM | POA: Diagnosis not present

## 2018-06-27 DIAGNOSIS — E538 Deficiency of other specified B group vitamins: Secondary | ICD-10-CM | POA: Diagnosis not present

## 2018-06-27 DIAGNOSIS — I480 Paroxysmal atrial fibrillation: Secondary | ICD-10-CM | POA: Diagnosis not present

## 2018-06-27 DIAGNOSIS — M810 Age-related osteoporosis without current pathological fracture: Secondary | ICD-10-CM | POA: Diagnosis not present

## 2018-06-27 DIAGNOSIS — I482 Chronic atrial fibrillation: Secondary | ICD-10-CM | POA: Diagnosis not present

## 2018-06-27 DIAGNOSIS — I5020 Unspecified systolic (congestive) heart failure: Secondary | ICD-10-CM | POA: Diagnosis not present

## 2018-06-27 DIAGNOSIS — E785 Hyperlipidemia, unspecified: Secondary | ICD-10-CM | POA: Diagnosis not present

## 2018-06-27 DIAGNOSIS — I110 Hypertensive heart disease with heart failure: Secondary | ICD-10-CM | POA: Diagnosis not present

## 2018-06-27 LAB — BASIC METABOLIC PANEL
ANION GAP: 7 (ref 5–15)
BUN: 26 mg/dL — ABNORMAL HIGH (ref 8–23)
CO2: 22 mmol/L (ref 22–32)
CREATININE: 0.84 mg/dL (ref 0.44–1.00)
Calcium: 8 mg/dL — ABNORMAL LOW (ref 8.9–10.3)
Chloride: 108 mmol/L (ref 98–111)
GFR calc non Af Amer: >60 mL/min (ref >60)
Glucose, Bld: 115 mg/dL — ABNORMAL HIGH (ref 70–99)
POTASSIUM: 3.9 mmol/L (ref 3.5–5.1)
SODIUM: 137 mmol/L (ref 135–145)

## 2018-06-27 LAB — CBC
HEMATOCRIT: 39.9 % (ref 35.0–47.0)
HEMOGLOBIN: 13.8 g/dL (ref 12.0–16.0)
MCH: 30.6 pg (ref 26.0–34.0)
MCHC: 34.5 g/dL (ref 32.0–36.0)
MCV: 88.7 fL (ref 80.0–100.0)
Platelets: 180 10*3/uL (ref 150–440)
RBC: 4.5 MIL/uL (ref 3.80–5.20)
RDW: 14.2 % (ref 11.5–14.5)
WBC: 6.9 10*3/uL (ref 3.6–11.0)

## 2018-06-27 LAB — GLUCOSE, CAPILLARY: GLUCOSE-CAPILLARY: 106 mg/dL — AB (ref 70–99)

## 2018-06-27 MED ORDER — CEFUROXIME AXETIL 250 MG PO TABS: 250.0000 mg | ORAL_TABLET | Freq: Two times a day (BID) | ORAL | 0 refills | Status: AC

## 2018-06-27 MED ORDER — SODIUM CHLORIDE 0.9 % IV SOLN
1.0000 g | INTRAVENOUS | Status: DC
  Filled 2018-06-27: qty 10

## 2018-06-27 NOTE — Discharge Summary (Signed)
[name] NAME: Jennifer Klein    MR#:  [id number]  DATE OF BIRTH:  1941-01-02  DATE OF ADMISSION:  06/23/2018 ADMITTING PHYSICIAN: Jennifer Jo, MD  DATE OF DISCHARGE: 06/27/2018  PRIMARY CARE PHYSICIAN: Jennifer Courser, MD    ADMISSION DIAGNOSIS:  Tachycardia [R00.0] Fever, unspecified fever cause [R50.9]  DISCHARGE DIAGNOSIS:  Active Problems:   Sepsis ([place])   SECONDARY DIAGNOSIS:   Past Medical History:  Diagnosis Date  . Alzheimer's dementia with behavioral disturbance 05/04/2016  . Alzheimer's dementia without behavioral disturbance 05/04/2016  . B12 deficiency 05/06/2016  . Gout   . Hemorrhoids   . Hyperlipidemia LDL goal <100 05/05/2016  . Hypertension   . Memory loss   . PAF (paroxysmal atrial fibrillation) ([place])    a. on Xarelto; b. 48-hr Holter 2014: NSR with PAF/flutter which happened at least 2.26% of the time w/ associated tachycardia. Patient was asymptomatic; c. CHADS2VASc => 4 (HTN, age x 2, female)  . Paroxysmal atrial fibrillation ([place]) 08/07/2013  . Senile osteoporosis   . Systolic dysfunction    a. echo 08/2013: EF 50-55%, mild MR, mildly dilated LA    HOSPITAL COURSE:   77 year old female with past medical history of dementia, paroxysmal atrial fibrillation, hypertension, gout who presented to the hospital due to altered mental status and noted to have urinary tract infection.  1.  Altered mental status- due to advanced dementia combined with underlying UTI. -Patient was treated with IV ceftriaxone for UTI and her mental status is improved and is back to baseline.  2.  Urinary tract infection- treated with IV ceftriaxone, urine cultures positive for E. coli which is sensitive to Ceftriaxone and will switch to oral Ceftin for additional 3 days upon discharge.  3.  Acute renal failure-secondary to dehydration and also due to underlying UTI.   -Improved and resolved with IV antibiotics and IV fluids.   Creatinine back to baseline.  4.  Atrial fibrillation with rapid ventricular response- initially patient's heart rates were labile due to the GI tract infection and acute renal failure.  Heart rates have improved after treatment of underlying infection with IV antibiotics and fluids. -Patient will continue her metoprolol and Xarelto.    5.  Dementia- pt. Will continue Aricept, Namenda.  6.  Anxiety- pt. Will continue BuSpar.  7.  GERD- pt. Will continue Protonix.  8. Hyperlipidemia - pt. will cont. Atorvastatin.     DISCHARGE CONDITIONS:   Stable  CONSULTS OBTAINED:    DRUG ALLERGIES:   Allergies  Allergen Reactions  . Darvon [Propoxyphene Hcl]     As a child    DISCHARGE MEDICATIONS:   Allergies as of 06/27/2018      Reactions   Darvon [propoxyphene Hcl]    As a child      Medication List    STOP taking these medications   sulfamethoxazole-trimethoprim 800-160 MG tablet Commonly known as:  BACTRIM DS,SEPTRA DS     TAKE these medications   atorvastatin 20 MG tablet Commonly known as:  LIPITOR TAKE 1 TABLET BY MOUTH AT BEDTIME   benzonatate 200 MG capsule Commonly known as:  TESSALON Take 200 mg by mouth 3 (three) times daily as needed for cough.   busPIRone 5 MG tablet Commonly known as:  BUSPAR TAKE 1 TABLET BY MOUTH 2 TIMES DAILY FORDEPRESSION   cefUROXime 250 MG tablet Commonly known as:  CEFTIN Take 1 tablet (250 mg total) by mouth 2 (two) times daily  with a meal for 3 days.   cholecalciferol 1000 units tablet Commonly known as:  VITAMIN D TAKE 1 TABLET BY MOUTH ONCE DAILY   donepezil 10 MG tablet Commonly known as:  ARICEPT TAKE 1 TABLET BY MOUTH BEDTIME   memantine 10 MG tablet Commonly known as:  NAMENDA TAKE 1 TABLET BY MOUTH TWICE DAILY   metoprolol tartrate 25 MG tablet Commonly known as:  LOPRESSOR TAKE 1 TABLET BY MOUTH TWICE A DAY   pantoprazole 40 MG tablet Commonly known as:  PROTONIX TAKE 1 TABLET BY MOUTH DAILY    ULORIC 40 MG tablet Generic drug:  febuxostat TAKE 1 TABLET BY MOUTH ONCE DAILY FOR GOUT   Vitamin B-12 500 MCG Subl One by mouth daily   XARELTO 15 MG Tabs tablet Generic drug:  Rivaroxaban TAKE 1 TABLET BY MOUTH DAILY WITH SUPPER         DISCHARGE INSTRUCTIONS:   DIET:  Cardiac diet  DISCHARGE CONDITION:  Stable  ACTIVITY:  Activity as tolerated  OXYGEN:  Home Oxygen: No.   Oxygen Delivery: room air  DISCHARGE LOCATION:  nursing home   If you experience worsening of your admission symptoms, develop shortness of breath, life threatening emergency, suicidal or homicidal thoughts you must seek medical attention immediately by calling 911 or calling your MD immediately  if symptoms less severe.  You Must read complete instructions/literature along with all the possible adverse reactions/side effects for all the Medicines you take and that have been prescribed to you. Take any new Medicines after you have completely understood and accpet all the possible adverse reactions/side effects.   Please note  You were cared for by a hospitalist during your hospital stay. If you have any questions about your discharge medications or the care you received while you were in the hospital after you are discharged, you can call the unit and asked to speak with the hospitalist on call if the hospitalist that took care of you is not available. Once you are discharged, your primary care physician will handle any further medical issues. Please note that NO REFILLS for any discharge medications will be authorized once you are discharged, as it is imperative that you return to your primary care physician (or establish a relationship with a primary care physician if you do not have one) for your aftercare needs so that they can reassess your need for medications and monitor your lab values.     Today   No acute events overnight.  Afebrile hemodynamically stable.  Remains confused which is  her baseline.  Will discharge to skilled nursing facility on oral antibiotics today.  VITAL SIGNS:  Blood pressure 114/78, pulse 79, temperature 97.7 F (36.5 C), temperature source Oral, resp. rate 18, height 5\' 6"  (1.676 m), weight 71 kg (156 lb 8.4 oz), SpO2 99 %.  I/O:    Intake/Output Summary (Last 24 hours) at 06/27/2018 1400 Last data filed at 06/27/2018 0609 Gross per 24 hour  Intake 480 ml  Output 2 ml  Net 478 ml    PHYSICAL EXAMINATION:   GENERAL:  77 y.o.-year-old patient lying in bed confused but in NAD.  EYES: Pupils equal, round, reactive to light. No scleral icterus. Extraocular muscles intact.  HEENT: Head atraumatic, normocephalic. Oropharynx and nasopharynx clear.  NECK:  Supple, no jugular venous distention. No thyroid enlargement, no tenderness.  LUNGS: Normal breath sounds bilaterally, no wheezing, rales, rhonchi. No use of accessory muscles of respiration.  CARDIOVASCULAR: S1, S2 normal. No murmurs, rubs,  or gallops.  ABDOMEN: Soft, nontender, nondistended. Bowel sounds present. No organomegaly or mass.  EXTREMITIES: No cyanosis, clubbing or edema b/l.    NEUROLOGIC: Cranial nerves II through XII are intact. No focal Motor or sensory deficits b/l.   PSYCHIATRIC: The patient is alert and oriented x 1.  SKIN: No obvious rash, lesion, or ulcer.   DATA REVIEW:   CBC Recent Labs  Lab 06/27/18 0444  WBC 6.9  HGB 13.8  HCT 39.9  PLT 180    Chemistries  Recent Labs  Lab 06/23/18 1706 06/24/18 0500 06/27/18 0444  NA 136 135 137  K 4.3 3.2* 3.9  CL 106 111 108  CO2 21* 19* 22  GLUCOSE 123* 100* 115*  BUN 24* 18 26*  CREATININE 1.47* 1.03* 0.84  CALCIUM 8.6* 7.5* 8.0*  MG  --  1.9  --   AST 37  --   --   ALT 17  --   --   ALKPHOS 106  --   --   BILITOT 0.8  --   --     Cardiac Enzymes No results for input(s): TROPONINI in the last 168 hours.  Microbiology Results  Results for orders placed or performed during the hospital encounter of  06/23/18  Blood culture (routine x 2)     Status: None (Preliminary result)   Collection Time: 06/23/18  5:16 PM  Result Value Ref Range Status   Specimen Description BLOOD LEFT ANTECUBITAL  Final   Special Requests   Final    BOTTLES DRAWN AEROBIC AND ANAEROBIC Blood Culture adequate volume   Culture   Final    NO GROWTH 4 DAYS Performed at [facility], [address], [place], [place] [address]    Report Status PENDING  Incomplete  Urine culture     Status: Abnormal   Collection Time: 06/23/18  5:18 PM  Result Value Ref Range Status   Specimen Description   Final    URINE, RANDOM Performed at [facility], [address] [place], [place] [address]    Special Requests   Final    NONE Performed at [facility], [address] [place], [place] [address]    Culture >=100,000 COLONIES/mL ESCHERICHIA COLI (A)  Final   Report Status 06/26/2018 FINAL  Final   Organism ID, Bacteria ESCHERICHIA COLI (A)  Final      Susceptibility   Escherichia coli - MIC*    AMPICILLIN 8 SENSITIVE Sensitive     CEFAZOLIN <=4 SENSITIVE Sensitive     CEFTRIAXONE <=1 SENSITIVE Sensitive     CIPROFLOXACIN >=4 RESISTANT Resistant     GENTAMICIN <=1 SENSITIVE Sensitive     IMIPENEM <=0.25 SENSITIVE Sensitive     NITROFURANTOIN <=16 SENSITIVE Sensitive     TRIMETH/SULFA >=320 RESISTANT Resistant     AMPICILLIN/SULBACTAM 4 SENSITIVE Sensitive     PIP/TAZO <=4 SENSITIVE Sensitive     Extended ESBL NEGATIVE Sensitive     * >=100,000 COLONIES/mL ESCHERICHIA COLI  Blood culture (routine x 2)     Status: None (Preliminary result)   Collection Time: 06/23/18  5:19 PM  Result Value Ref Range Status   Specimen Description BLOOD BLOOD LEFT WRIST  Final   Special Requests   Final    BOTTLES DRAWN AEROBIC AND ANAEROBIC Blood Culture adequate volume   Culture   Final    NO GROWTH 4 DAYS Performed at [facility], [address], [place], [place] [address]    Report  Status PENDING  Incomplete  MRSA PCR Screening     Status: None   Collection Time: 06/24/18 12:38 AM  Result Value Ref Range Status   MRSA by PCR NEGATIVE NEGATIVE Final    Comment:        The GeneXpert MRSA Assay (FDA approved for NASAL specimens only), is one component of a comprehensive MRSA colonization surveillance program. It is not intended to diagnose MRSA infection nor to guide or monitor treatment for MRSA infections. Performed at [facility], [address] [place], [place] [address]     RADIOLOGY:  No results found.    Management plans discussed with the patient, family and they are in agreement.  CODE STATUS:     Code Status Orders  (From admission, onward)        Start     Ordered   06/24/18 0014  Full code  Continuous     06/24/18 0013   TOTAL TIME TAKING CARE OF THIS PATIENT: 40 minutes.    Henreitta Leber M.D on 06/27/2018 at 2:00 PM  Between 7am to 6pm - Pager - [phone]  After 6pm go to [email or web address] - Proofreader  [facility]  Office  [phone]  CC: Primary care physician; Jennifer Courser, MD

## 2018-06-27 NOTE — Clinical Social Work Note (Signed)
CSW extended additional bed offers. Patient's husband has chosen [facility]. Patient's husband aware that patient will discharge there today and he requests EMS. Physician aware and will work on discharge. Shela Leff MSW,LCSW [phone]

## 2018-06-27 NOTE — Progress Notes (Signed)
Physical Therapy Treatment Patient Details Name: Jennifer Klein MRN: [id number] DOB: [birth year] Today's Date: 06/27/2018    History of Present Illness 77yo female pt presented to ER secondary to fever, generalized weakness; admitted for management of sepsis (unknown origin).    PT Comments    Patient remains confused, but cooperative with session with encouragement, hand-over-hand guidance.  Was able to initiate gait training this date (170'), requiring bilat HHA with constant manual cuing for bilat ant/lateral weight shift and guidance of movement.  Limited balance reactions, safety awareness and insight; unsafe to attempt without +2 at this time. Will continue to progress mobility with emphasis on functional, automatic tasks to maximize patient comprehension of task and performance overall.    Follow Up Recommendations  SNF     Equipment Recommendations       Recommendations for Other Services       Precautions / Restrictions Precautions Precautions: Fall Restrictions Weight Bearing Restrictions: No    Mobility  Bed Mobility Overal bed mobility: Needs Assistance Bed Mobility: Supine to Sit     Supine to sit: Mod assist     General bed mobility comments: assist to guide LEs and elevate trunk; passively resisting movement at times  Transfers Overall transfer level: Needs assistance Equipment used: 2 person hand held assist Transfers: Sit to/from Stand Sit to Stand: Mod assist;+2 physical assistance         General transfer comment: hand-over-hand and manual faciltiation for weight shift, movement initiation; increased use of rocking to calm patient and facilitate forward weight shift  Ambulation/Gait Ambulation/Gait assistance: Mod assist;+2 physical assistance Gait Distance (Feet): 170 Feet Assistive device: 2 person hand held assist       General Gait Details: very guarded and rigid, limited trunk rotation and arm sway; did relax partially throughout gait  distance.  Good LE strength, but requires consistent cuing/hand-over-hand guidance for path negotiation, task initiation and weight shift   Stairs             Wheelchair Mobility    Modified Rankin (Stroke Patients Only)       Balance Overall balance assessment: Needs assistance Sitting-balance support: No upper extremity supported;Feet supported Sitting balance-Leahy Scale: Fair Sitting balance - Comments: intermittent posterior trunk lean   Standing balance support: Bilateral upper extremity supported Standing balance-Leahy Scale: Poor Standing balance comment: limited balance/righting reactions                            Cognition Arousal/Alertness: Awake/alert Behavior During Therapy: WFL for tasks assessed/performed Overall Cognitive Status: Difficult to assess                                 General Comments: hx of advanced dementia at baseline, based on facility caregiver report of PLOF pt appears to more confused; intermittent follows simple commands with hand-over-hand; slightly resistive, agitated (slapping, pinching if afraid/unsure of movement pattern)      Exercises      General Comments        Pertinent Vitals/Pain Pain Assessment: Faces Faces Pain Scale: No hurt    Home Living                      Prior Function            PT Goals (current goals can now be found in the care plan section) Acute Rehab PT  Goals PT Goal Formulation: Patient unable to participate in goal setting Time For Goal Achievement: 07/08/18 Potential to Achieve Goals: Fair Progress towards PT goals: Progressing toward goals    Frequency    Min 2X/week      PT Plan Current plan remains appropriate    Co-evaluation              AM-PAC PT "6 Clicks" Daily Activity  Outcome Measure  Difficulty turning over in bed (including adjusting bedclothes, sheets and blankets)?: Unable Difficulty moving from lying on back to  sitting on the side of the bed? : Unable Difficulty sitting down on and standing up from a chair with arms (e.g., wheelchair, bedside commode, etc,.)?: Unable Help needed moving to and from a bed to chair (including a wheelchair)?: A Lot Help needed walking in hospital room?: A Lot Help needed climbing 3-5 steps with a railing? : Total 6 Click Score: 8    End of Session Equipment Utilized During Treatment: Gait belt Activity Tolerance: Patient tolerated treatment well Patient left: in chair;with chair alarm set;with call bell/phone within reach Nurse Communication: Mobility status PT Visit Diagnosis: Unsteadiness on feet (R26.81);Difficulty in walking, not elsewhere classified (R26.2)     Time: 8527-7824 PT Time Calculation (min) (ACUTE ONLY): 23 min  Charges:  $Gait Training: 8-22 mins $Therapeutic Activity: 8-22 mins                    G Codes:      Syra Sirmons H. Owens Shark, PT, DPT, NCS 06/27/18, 2:52 PM [phone]

## 2018-06-27 NOTE — Clinical Social Work Note (Signed)
CSW spoke with patient's husband on the phone this morning and extended the bed offers. Patient's husband has asked if [facility] could take patient back with her current mobility. CSW has reached out to [place] at [facility] and he is going to speak with Forensic psychologist. Shela Leff MSW,LCSW [phone]

## 2018-06-27 NOTE — Care Management Important Message (Signed)
Patient unable to sign IM. Called and left message with husband to go over right, awaiting callback.

## 2018-06-28 DIAGNOSIS — F0390 Unspecified dementia without behavioral disturbance: Secondary | ICD-10-CM | POA: Diagnosis not present

## 2018-06-28 DIAGNOSIS — I482 Chronic atrial fibrillation: Secondary | ICD-10-CM | POA: Diagnosis not present

## 2018-06-28 DIAGNOSIS — G9340 Encephalopathy, unspecified: Secondary | ICD-10-CM | POA: Diagnosis not present

## 2018-06-28 DIAGNOSIS — N179 Acute kidney failure, unspecified: Secondary | ICD-10-CM | POA: Diagnosis not present

## 2018-06-28 LAB — CULTURE, BLOOD (ROUTINE X 2)
Culture: NO GROWTH
Culture: NO GROWTH
SPECIAL REQUESTS: ADEQUATE
Special Requests: ADEQUATE

## 2018-07-06 DIAGNOSIS — N390 Urinary tract infection, site not specified: Secondary | ICD-10-CM | POA: Diagnosis not present

## 2018-07-06 DIAGNOSIS — I480 Paroxysmal atrial fibrillation: Secondary | ICD-10-CM | POA: Diagnosis not present

## 2018-07-06 DIAGNOSIS — K219 Gastro-esophageal reflux disease without esophagitis: Secondary | ICD-10-CM | POA: Diagnosis not present

## 2018-07-06 DIAGNOSIS — F0390 Unspecified dementia without behavioral disturbance: Secondary | ICD-10-CM | POA: Diagnosis not present

## 2018-07-11 ENCOUNTER — Telehealth: Payer: Self-pay | Admitting: Family Medicine

## 2018-07-11 DIAGNOSIS — N183 Chronic kidney disease, stage 3 unspecified: Secondary | ICD-10-CM

## 2018-07-11 DIAGNOSIS — M1A00X0 Idiopathic chronic gout, unspecified site, without tophus (tophi): Secondary | ICD-10-CM

## 2018-07-11 NOTE — Telephone Encounter (Signed)
Copied from [place] [id number]. Topic: Quick Communication - See Telephone Encounter >> Jul 11, 2018 10:30 AM Bea Graff, NT wrote: CRM for notification. See Telephone encounter for: 07/11/18. Anderson Malta with [facility] calling and states pt has been agitated since returning back to them and wanted to see if the doctor could order LORazepam (ATIVAN) 0.5 MG tablet for when needed. CB#: [place].

## 2018-07-12 MED ORDER — LORAZEPAM 0.5 MG PO TABS: 0.2500 mg | ORAL_TABLET | Freq: Two times a day (BID) | ORAL | 2 refills | Status: DC | PRN

## 2018-07-12 MED ORDER — ALLOPURINOL 100 MG PO TABS: 100.0000 mg | ORAL_TABLET | Freq: Every day | ORAL | 0 refills | Status: DC

## 2018-07-12 NOTE — Telephone Encounter (Signed)
I called to discuss this issue, as well as new black box warning on Uloric regarding CV risk STOP Uloric Start allopurinol 100 mg Check uric acid and BMP in two weeks  I spoke with staff; ever since she came back from rehab; she is always agitated, clapping her hands in people's faces, difficult to redirect her; staff member thinks the ativan would work  Fax: [phone] (she thinks)  -----------------------------------------------------  New orders:  1.  STOP Uloric  2.  Start allopurinol 100 mg by mouth once a day  3.  Start lorazepam 0.25 mg by mouth twice a day PRN agitation  4.  Have patient come in for BMP and uric acid two weeks after starting allopurinol   _________________________ ___________ Loma Sousa, M.D.        Date

## 2018-07-14 NOTE — Telephone Encounter (Signed)
They are now sent via [employer] The pharmacist can call me if there is a problem with them receiving my electronic Rx, but I don't plan to send duplicate prescriptions

## 2018-07-14 NOTE — Telephone Encounter (Signed)
notified

## 2018-07-14 NOTE — Telephone Encounter (Signed)
Anderson Malta from [employer] [employer] called and said that she needs a paper prescription to put in their books so they can send to pharmacy for the Lorazepam  Fax [phone] attention to [facility]

## 2018-08-10 ENCOUNTER — Emergency Department
Admission: EM | Admit: 2018-08-10 | Discharge: 2018-08-10 | Disposition: A | Discharge status: Home / Self Care | Payer: Medicare Other | Attending: Emergency Medicine | Admitting: Emergency Medicine

## 2018-08-10 ENCOUNTER — Telehealth: Payer: Self-pay

## 2018-08-10 ENCOUNTER — Emergency Department: Payer: Medicare Other

## 2018-08-10 DIAGNOSIS — R51 Headache: Secondary | ICD-10-CM | POA: Diagnosis not present

## 2018-08-10 DIAGNOSIS — W19XXXA Unspecified fall, initial encounter: Secondary | ICD-10-CM | POA: Diagnosis not present

## 2018-08-10 DIAGNOSIS — W0110XA Fall on same level from slipping, tripping and stumbling with subsequent striking against unspecified object, initial encounter: Secondary | ICD-10-CM | POA: Insufficient documentation

## 2018-08-10 DIAGNOSIS — Y999 Unspecified external cause status: Secondary | ICD-10-CM | POA: Diagnosis not present

## 2018-08-10 DIAGNOSIS — N183 Chronic kidney disease, stage 3 (moderate): Secondary | ICD-10-CM | POA: Diagnosis not present

## 2018-08-10 DIAGNOSIS — R4182 Altered mental status, unspecified: Secondary | ICD-10-CM | POA: Diagnosis not present

## 2018-08-10 DIAGNOSIS — Y939 Activity, unspecified: Secondary | ICD-10-CM | POA: Insufficient documentation

## 2018-08-10 DIAGNOSIS — S199XXA Unspecified injury of neck, initial encounter: Secondary | ICD-10-CM | POA: Diagnosis not present

## 2018-08-10 DIAGNOSIS — I129 Hypertensive chronic kidney disease with stage 1 through stage 4 chronic kidney disease, or unspecified chronic kidney disease: Secondary | ICD-10-CM | POA: Insufficient documentation

## 2018-08-10 DIAGNOSIS — Z87891 Personal history of nicotine dependence: Secondary | ICD-10-CM | POA: Insufficient documentation

## 2018-08-10 DIAGNOSIS — Z79899 Other long term (current) drug therapy: Secondary | ICD-10-CM | POA: Diagnosis not present

## 2018-08-10 DIAGNOSIS — F0280 Dementia in other diseases classified elsewhere without behavioral disturbance: Secondary | ICD-10-CM | POA: Insufficient documentation

## 2018-08-10 DIAGNOSIS — S0990XA Unspecified injury of head, initial encounter: Secondary | ICD-10-CM | POA: Insufficient documentation

## 2018-08-10 DIAGNOSIS — M542 Cervicalgia: Secondary | ICD-10-CM | POA: Diagnosis not present

## 2018-08-10 DIAGNOSIS — Z743 Need for continuous supervision: Secondary | ICD-10-CM | POA: Diagnosis not present

## 2018-08-10 DIAGNOSIS — G308 Other Alzheimer's disease: Secondary | ICD-10-CM | POA: Insufficient documentation

## 2018-08-10 DIAGNOSIS — F0390 Unspecified dementia without behavioral disturbance: Secondary | ICD-10-CM | POA: Diagnosis not present

## 2018-08-10 DIAGNOSIS — Y92129 Unspecified place in nursing home as the place of occurrence of the external cause: Secondary | ICD-10-CM | POA: Insufficient documentation

## 2018-08-10 NOTE — ED Provider Notes (Signed)
[facility] Emergency Department Provider Note  Time seen: 3:25 PM  I have reviewed the triage vital signs and the nursing notes.   HISTORY  Chief Complaint No chief complaint on file.    HPI Jennifer Klein is a 77 y.o. female with a past medical history of dementia, hypertension, paroxysmal atrial fibrillation on Xarelto who presents to the emergency department after a fall.  According to report patient was at her nursing facility had a fall in which she hit her head.  No reported LOC.  Patient is at her baseline currently per report.  Here the patient is awake alert she is calm, cooperative in no distress but she is confused and disoriented which is her baseline.   Past Medical History:  Diagnosis Date  . Alzheimer's dementia with behavioral disturbance 05/04/2016  . Alzheimer's dementia without behavioral disturbance 05/04/2016  . B12 deficiency 05/06/2016  . Gout   . Hemorrhoids   . Hyperlipidemia LDL goal <100 05/05/2016  . Hypertension   . Memory loss   . PAF (paroxysmal atrial fibrillation) ([place])    a. on Xarelto; b. 48-hr Holter 2014: NSR with PAF/flutter which happened at least 2.26% of the time w/ associated tachycardia. Patient was asymptomatic; c. CHADS2VASc => 4 (HTN, age x 2, female)  . Paroxysmal atrial fibrillation ([place]) 08/07/2013  . Senile osteoporosis   . Systolic dysfunction    a. echo 08/2013: EF 50-55%, mild MR, mildly dilated LA    Patient Active Problem List   Diagnosis Date Noted  . Sepsis ([place]) 06/23/2018  . Fall 09/10/2017  . PAD (peripheral artery disease) ([place]) 06/22/2017  . Chronic venous insufficiency 06/22/2017  . CKD (chronic kidney disease) stage 3, GFR 30-59 ml/min (HCC) 09/04/2016  . B12 deficiency 05/06/2016  . Hyperlipidemia LDL goal <100 05/05/2016  . Alzheimer's dementia without behavioral disturbance 05/04/2016  . Medication monitoring encounter 05/04/2016  . Barrett's esophagus 11/09/2015  . Paroxysmal atrial  fibrillation ([place]) 08/07/2013  . Osteopenia 08/08/2012  . Hypertension 06/03/2012  . Gout 06/03/2012    Past Surgical History:  Procedure Laterality Date  . TUBAL LIGATION      Prior to Admission medications   Medication Sig Start Date End Date Taking? Authorizing Provider  allopurinol (ZYLOPRIM) 100 MG tablet Take 1 tablet (100 mg total) by mouth daily. ** STOP Uloric ** 07/12/18   Lada, Satira Anis, MD  atorvastatin (LIPITOR) 20 MG tablet TAKE 1 TABLET BY MOUTH AT BEDTIME 06/07/18   Lada, Satira Anis, MD  benzonatate (TESSALON) 200 MG capsule Take 200 mg by mouth 3 (three) times daily as needed for cough.    [provider]  busPIRone (BUSPAR) 5 MG tablet TAKE 1 TABLET BY MOUTH 2 TIMES DAILY FORDEPRESSION 05/30/18   Lada, Satira Anis, MD  cholecalciferol (VITAMIN D) 1000 units tablet TAKE 1 TABLET BY MOUTH ONCE DAILY 04/13/18   Lada, Satira Anis, MD  Cyanocobalamin (VITAMIN B-12) 500 MCG SUBL One by mouth daily 01/27/18   Lada, Satira Anis, MD  donepezil (ARICEPT) 10 MG tablet TAKE 1 TABLET BY MOUTH BEDTIME 08/04/17   Lada, Satira Anis, MD  LORazepam (ATIVAN) 0.5 MG tablet Take 0.5 tablets (0.25 mg total) by mouth 2 (two) times daily as needed for anxiety. 07/12/18   Arnetha Courser, MD  memantine (NAMENDA) 10 MG tablet TAKE 1 TABLET BY MOUTH TWICE DAILY 08/11/17   Arnetha Courser, MD  metoprolol tartrate (LOPRESSOR) 25 MG tablet TAKE 1 TABLET BY MOUTH TWICE A DAY 04/08/18  Wellington Hampshire, MD  pantoprazole (PROTONIX) 40 MG tablet TAKE 1 TABLET BY MOUTH DAILY 12/27/17   Lada, Satira Anis, MD  XARELTO 15 MG TABS tablet TAKE 1 TABLET BY MOUTH DAILY WITH SUPPER 01/17/18   Lada, Satira Anis, MD    Allergies  Allergen Reactions  . Darvon [Propoxyphene Hcl]     As a child    Family History  Problem Relation Age of Onset  . Heart disease Father   . Gout Brother     Social History Social History   Tobacco Use  . Smoking status: Former Smoker    Packs/day: 0.25    Years: 1.00    Pack years:  0.25    Types: Cigarettes  . Smokeless tobacco: Never Used  Substance Use Topics  . Alcohol use: No    Alcohol/week: 0.0 standard drinks  . Drug use: No    Review of Systems Unable to obtain an adequate/accurate review of systems secondary to baseline dementia  ____________________________________________   PHYSICAL EXAM:  VITAL SIGNS: ED Triage Vitals  Enc Vitals Group     BP 08/10/18 1443 130/66     Pulse Rate 08/10/18 1443 (!) 56     Resp 08/10/18 1443 18     Temp 08/10/18 1443 (!) 96.6 F (35.9 C)     Temp Source 08/10/18 1443 Axillary     SpO2 08/10/18 1443 99 %     Weight 08/10/18 1450 156 lb 8.4 oz (71 kg)     Height --      Head Circumference --      Peak Flow --      Pain Score --      Pain Loc --      Pain Edu? --      Excl. in [place]? --    Constitutional: Alert, calm and cooperative, no distress.  Confused. Eyes: Normal exam ENT   Head: Normocephalic and atraumatic.   Mouth/Throat: Mucous membranes are moist. Cardiovascular: Normal rate, regular rhythm. No murmur Respiratory: Normal respiratory effort without tachypnea nor retractions. Breath sounds are clear  Gastrointestinal: Soft and nontender. No distention. Musculoskeletal: Nontender with normal range of motion in all extremities.  Neurologic:  Normal speech and language. No gross focal neurologic deficits Skin:  Skin is warm, dry and intact.  Psychiatric: Mood and affect are normal.  ____________________________________________   RADIOLOGY  CT head neck negative for acute abnormality  ____________________________________________   INITIAL IMPRESSION / ASSESSMENT AND PLAN / ED COURSE  Pertinent labs & imaging results that were available during my care of the patient were reviewed by me and considered in my medical decision making (see chart for details).  Patient presents to the emergency department after a fall at her nursing facility.  Patient has a history of advanced dementia.  Is  not able to contribute to her history or review of systems at this time.  She is calm and cooperative, no distress.  No obvious head trauma although reported head injury.  As the patient is on Xarelto for paroxysmal atrial fibrillation we will obtain a CT scan of the head and neck as a precaution.  If CT imaging is negative anticipate likely discharge to a nursing facility.  CT scans are negative for acute abnormality.  We will discharge back to her nursing facility.  ____________________________________________   FINAL CLINICAL IMPRESSION(S) / ED DIAGNOSES  Fall Head injury    Harvest Dark, MD 08/10/18 [phone]

## 2018-08-10 NOTE — ED Triage Notes (Signed)
Pt in memory care unit at [facility]. [address] and struck face. Brought in by EMS. Pt takes xarelto. Oriented to baseline.

## 2018-08-10 NOTE — Telephone Encounter (Signed)
notified

## 2018-08-10 NOTE — ED Notes (Signed)
Pt unable to answer all screening questions due to altered mental status at baseline.

## 2018-08-10 NOTE — Telephone Encounter (Signed)
Copied from [place] [id number]. Topic: General - Other >> Aug 10, 2018  1:25 PM Yvette Rack wrote: Reason for CRM: Tiffany med tech  from [facility] [phone] calling to let provider know that pt fell today while she was walking around 1:00 and hit her lt eye and cheek and want to know if the pt need to be sent out or put in an order for an xray

## 2018-08-10 NOTE — ED Notes (Signed)
Pt unable to sign for discharge, but paperwork will be given to EMS and nursing facility already contacted regarding pts discharge.

## 2018-08-10 NOTE — Telephone Encounter (Signed)
Yes, please have her go to the ER

## 2018-08-10 NOTE — ED Notes (Signed)
Report given to Tiffany, at [facility].

## 2018-08-14 DIAGNOSIS — M25561 Pain in right knee: Secondary | ICD-10-CM | POA: Diagnosis not present

## 2018-08-15 ENCOUNTER — Emergency Department
Admission: EM | Admit: 2018-08-15 | Discharge: 2018-08-15 | Disposition: A | Discharge status: Home / Self Care | Payer: Medicare Other | Attending: Emergency Medicine | Admitting: Emergency Medicine

## 2018-08-15 ENCOUNTER — Emergency Department: Payer: Medicare Other

## 2018-08-15 DIAGNOSIS — R4182 Altered mental status, unspecified: Secondary | ICD-10-CM | POA: Diagnosis not present

## 2018-08-15 DIAGNOSIS — S0990XA Unspecified injury of head, initial encounter: Secondary | ICD-10-CM | POA: Diagnosis not present

## 2018-08-15 DIAGNOSIS — Y929 Unspecified place or not applicable: Secondary | ICD-10-CM | POA: Diagnosis not present

## 2018-08-15 DIAGNOSIS — N183 Chronic kidney disease, stage 3 (moderate): Secondary | ICD-10-CM | POA: Insufficient documentation

## 2018-08-15 DIAGNOSIS — M25561 Pain in right knee: Secondary | ICD-10-CM | POA: Diagnosis not present

## 2018-08-15 DIAGNOSIS — Z7901 Long term (current) use of anticoagulants: Secondary | ICD-10-CM | POA: Diagnosis not present

## 2018-08-15 DIAGNOSIS — I129 Hypertensive chronic kidney disease with stage 1 through stage 4 chronic kidney disease, or unspecified chronic kidney disease: Secondary | ICD-10-CM | POA: Diagnosis not present

## 2018-08-15 DIAGNOSIS — F0390 Unspecified dementia without behavioral disturbance: Secondary | ICD-10-CM | POA: Insufficient documentation

## 2018-08-15 DIAGNOSIS — M7989 Other specified soft tissue disorders: Secondary | ICD-10-CM | POA: Diagnosis not present

## 2018-08-15 DIAGNOSIS — W010XXD Fall on same level from slipping, tripping and stumbling without subsequent striking against object, subsequent encounter: Secondary | ICD-10-CM | POA: Insufficient documentation

## 2018-08-15 DIAGNOSIS — Z79899 Other long term (current) drug therapy: Secondary | ICD-10-CM | POA: Diagnosis not present

## 2018-08-15 DIAGNOSIS — Z7401 Bed confinement status: Secondary | ICD-10-CM | POA: Diagnosis not present

## 2018-08-15 DIAGNOSIS — S8991XA Unspecified injury of right lower leg, initial encounter: Secondary | ICD-10-CM | POA: Diagnosis present

## 2018-08-15 DIAGNOSIS — S82001A Unspecified fracture of right patella, initial encounter for closed fracture: Secondary | ICD-10-CM | POA: Diagnosis not present

## 2018-08-15 DIAGNOSIS — Y939 Activity, unspecified: Secondary | ICD-10-CM | POA: Insufficient documentation

## 2018-08-15 DIAGNOSIS — Y999 Unspecified external cause status: Secondary | ICD-10-CM | POA: Insufficient documentation

## 2018-08-15 DIAGNOSIS — S82091A Other fracture of right patella, initial encounter for closed fracture: Secondary | ICD-10-CM | POA: Diagnosis not present

## 2018-08-15 DIAGNOSIS — Z87891 Personal history of nicotine dependence: Secondary | ICD-10-CM | POA: Insufficient documentation

## 2018-08-15 LAB — BASIC METABOLIC PANEL
ANION GAP: 7 (ref 5–15)
BUN: 16 mg/dL (ref 8–23)
CALCIUM: 8.3 mg/dL — AB (ref 8.9–10.3)
CO2: 26 mmol/L (ref 22–32)
Chloride: 106 mmol/L (ref 98–111)
Creatinine, Ser: 0.78 mg/dL (ref 0.44–1.00)
Glucose, Bld: 109 mg/dL — ABNORMAL HIGH (ref 70–99)
Potassium: 3.8 mmol/L (ref 3.5–5.1)
Sodium: 139 mmol/L (ref 135–145)

## 2018-08-15 LAB — CBC WITH DIFFERENTIAL/PLATELET
Basophils Absolute: 0 10*3/uL (ref 0–0.1)
Basophils Relative: 1 %
Eosinophils Absolute: 0.2 10*3/uL (ref 0–0.7)
Eosinophils Relative: 3 %
HEMATOCRIT: 35 % (ref 35.0–47.0)
Hemoglobin: 12.1 g/dL (ref 12.0–16.0)
Lymphocytes Relative: 17 %
Lymphs Abs: 1.2 10*3/uL (ref 1.0–3.6)
MCH: 31.4 pg (ref 26.0–34.0)
MCHC: 34.6 g/dL (ref 32.0–36.0)
MCV: 90.7 fL (ref 80.0–100.0)
MONO ABS: 0.6 10*3/uL (ref 0.2–0.9)
Monocytes Relative: 8 %
NEUTROS ABS: 5.1 10*3/uL (ref 1.4–6.5)
Neutrophils Relative %: 71 %
Platelets: 235 10*3/uL (ref 150–440)
RBC: 3.86 MIL/uL (ref 3.80–5.20)
RDW: 15 % — AB (ref 11.5–14.5)
WBC: 7.1 10*3/uL (ref 3.6–11.0)

## 2018-08-15 NOTE — ED Triage Notes (Signed)
Patient presents to the ED with right knee pain, swelling and warmth.  Per EMS patient fell last week and was brought to the ED and discharged back to facility with no known injuries.  Today patient was unable to stand or walk and her right knee is swollen and warm.  [facility] staff states knee was not swollen when patient went to bed last night.  They deny any other new falls.  Patient is alert and confused currently and at baseline.

## 2018-08-15 NOTE — ED Notes (Signed)
Family at bedside. 

## 2018-08-15 NOTE — ED Notes (Signed)
[facility]  Called  For  transport

## 2018-08-15 NOTE — ED Notes (Signed)
Pt ambulated with walker in room with steady slow gait noted

## 2018-08-15 NOTE — ED Provider Notes (Signed)
[facility] Emergency Department Provider Note ____________________________________________   First MD Initiated Contact with Patient 08/15/18 [phone]     (approximate)  I have reviewed the triage vital signs and the nursing notes.   HISTORY  Chief Complaint Knee Pain  HPI Jennifer Klein is a 77 y.o. female with a history of dementia as well as atrial fibrillation on Xarelto who was presented to the emergency department with right-sided knee pain.  The patient was evaluated in the hospital for a fall on 821 with a CT of the head and neck which were negative.  There were no limb findings at that time such as swelling or deformity or pain.  There have been no further falls reported at her memory care unit.  However, the patient was noted to have a right swollen knee this morning and then unable to ambulate.  EMS reports that the patient is normally ambulatory without issue.  Patient without complaints at this time and is unable to give further history secondary to dementia.  Past Medical History:  Diagnosis Date  . Alzheimer's dementia with behavioral disturbance 05/04/2016  . Alzheimer's dementia without behavioral disturbance 05/04/2016  . B12 deficiency 05/06/2016  . Gout   . Hemorrhoids   . Hyperlipidemia LDL goal <100 05/05/2016  . Hypertension   . Memory loss   . PAF (paroxysmal atrial fibrillation) ([place])    a. on Xarelto; b. 48-hr Holter 2014: NSR with PAF/flutter which happened at least 2.26% of the time w/ associated tachycardia. Patient was asymptomatic; c. CHADS2VASc => 4 (HTN, age x 2, female)  . Paroxysmal atrial fibrillation ([place]) 08/07/2013  . Senile osteoporosis   . Systolic dysfunction    a. echo 08/2013: EF 50-55%, mild MR, mildly dilated LA    Patient Active Problem List   Diagnosis Date Noted  . Sepsis ([place]) 06/23/2018  . Fall 09/10/2017  . PAD (peripheral artery disease) ([place]) 06/22/2017  . Chronic venous insufficiency 06/22/2017  . CKD  (chronic kidney disease) stage 3, GFR 30-59 ml/min (HCC) 09/04/2016  . B12 deficiency 05/06/2016  . Hyperlipidemia LDL goal <100 05/05/2016  . Alzheimer's dementia without behavioral disturbance 05/04/2016  . Medication monitoring encounter 05/04/2016  . Barrett's esophagus 11/09/2015  . Paroxysmal atrial fibrillation ([place]) 08/07/2013  . Osteopenia 08/08/2012  . Hypertension 06/03/2012  . Gout 06/03/2012    Past Surgical History:  Procedure Laterality Date  . TUBAL LIGATION      Prior to Admission medications   Medication Sig Start Date End Date Taking? Authorizing Provider  allopurinol (ZYLOPRIM) 100 MG tablet Take 1 tablet (100 mg total) by mouth daily. ** STOP Uloric ** 07/12/18  Yes Lada, Satira Anis, MD  atorvastatin (LIPITOR) 20 MG tablet TAKE 1 TABLET BY MOUTH AT BEDTIME 06/07/18  Yes Lada, Satira Anis, MD  busPIRone (BUSPAR) 5 MG tablet TAKE 1 TABLET BY MOUTH 2 TIMES DAILY FORDEPRESSION Patient taking differently: Take 5 mg by mouth 2 (two) times daily.  05/30/18  Yes Lada, Satira Anis, MD  cholecalciferol (VITAMIN D) 1000 units tablet TAKE 1 TABLET BY MOUTH ONCE DAILY 04/13/18  Yes Lada, Satira Anis, MD  Cyanocobalamin (VITAMIN B-12) 500 MCG SUBL One by mouth daily 01/27/18  Yes Lada, Satira Anis, MD  donepezil (ARICEPT) 10 MG tablet TAKE 1 TABLET BY MOUTH BEDTIME Patient taking differently: Take 10 mg by mouth at bedtime.  08/04/17  Yes Lada, Satira Anis, MD  LORazepam (ATIVAN) 0.5 MG tablet Take 0.5 tablets (0.25 mg total) by mouth 2 (  two) times daily as needed for anxiety. 07/12/18  Yes Lada, Satira Anis, MD  memantine (NAMENDA) 10 MG tablet TAKE 1 TABLET BY MOUTH TWICE DAILY 08/11/17  Yes Lada, Satira Anis, MD  metoprolol tartrate (LOPRESSOR) 25 MG tablet TAKE 1 TABLET BY MOUTH TWICE A DAY 04/08/18  Yes Wellington Hampshire, MD  pantoprazole (PROTONIX) 40 MG tablet TAKE 1 TABLET BY MOUTH DAILY 12/27/17  Yes Lada, Satira Anis, MD  XARELTO 15 MG TABS tablet TAKE 1 TABLET BY MOUTH DAILY WITH SUPPER Patient  taking differently: Take 15 mg by mouth daily with supper.  01/17/18  Yes Lada, Satira Anis, MD    Allergies Darvon [propoxyphene hcl]  Family History  Problem Relation Age of Onset  . Heart disease Father   . Gout Brother     Social History Social History   Tobacco Use  . Smoking status: Former Smoker    Packs/day: 0.25    Years: 1.00    Pack years: 0.25    Types: Cigarettes  . Smokeless tobacco: Never Used  Substance Use Topics  . Alcohol use: No    Alcohol/week: 0.0 standard drinks  . Drug use: No    Review of Systems  Level 5 caveat secondary to dementia.   ____________________________________________   PHYSICAL EXAM:  VITAL SIGNS: ED Triage Vitals [08/15/18 0756]  Enc Vitals Group     BP (!) 161/75     Pulse Rate (!) 57     Resp 18     Temp (!) 97.4 F (36.3 C)     Temp Source Axillary     SpO2 100 %     Weight 155 lb 11.2 oz (70.6 kg)     Height      Head Circumference      Peak Flow      Pain Score      Pain Loc      Pain Edu?      Excl. in [place]?     Constitutional: Alert and oriented. Well appearing and in no acute distress. Eyes: Conjunctivae are normal.  Head: Atraumatic. Nose: No congestion/rhinnorhea. Mouth/Throat: Mucous membranes are moist.  Neck: No stridor.   Cardiovascular: Normal rate, regular rhythm. Grossly normal heart sounds.  Good peripheral circulation with equal and bilateral dorsalis pedis pulses. Respiratory: Normal respiratory effort.  No retractions. Lungs CTAB. Gastrointestinal: Soft and nontender. No distention. No CVA tenderness. Musculoskeletal: Moderate right-sided knee effusion palpable.  Patient does not follow commands to actively range her lower extremities but I am able to passively range the bilateral lower extremities.  No deformity or ligamentous laxity elicited.   Neurologic:  Normal speech and language. No gross focal neurologic deficits are appreciated. Skin:  Skin is warm, dry and intact. No rash  noted. Psychiatric: Mood and affect are normal. Speech and behavior are normal.  ____________________________________________   LABS (all labs ordered are listed, but only abnormal results are displayed)  Labs Reviewed  CBC WITH DIFFERENTIAL/PLATELET - Abnormal; Notable for the following components:      Result Value   RDW 15.0 (*)    All other components within normal limits  BASIC METABOLIC PANEL - Abnormal; Notable for the following components:   Glucose, Bld 109 (*)    Calcium 8.3 (*)    All other components within normal limits   ____________________________________________  EKG   ____________________________________________  RADIOLOGY  Right-sided patellar fracture.  Other imaging without acute finding. ____________________________________________   PROCEDURES  Procedure(s) performed:   Procedures  Critical  Care performed:   ____________________________________________   INITIAL IMPRESSION / ASSESSMENT AND PLAN / ED COURSE  Pertinent labs & imaging results that were available during my care of the patient were reviewed by me and considered in my medical decision making (see chart for details).  DDX: Septic joint, fracture, ligamentous injury, contusion, embolus or dysfunction As part of my medical decision making, I reviewed the following data within the electronic MEDICAL RECORD NUMBER Notes from prior ED visits  ----------------------------------------- 10:48 AM on 08/15/2018 -----------------------------------------  Patient at this time with husband at the bedside.  Husband says that she has been ambulatory since her fall the 21st but less so.  New patellar fracture detected.  Discussed case with Dr. Sabra Heck who advises placing the patient in an Ace wrap as well as a knee immobilizer.  Nurse discussed disposition with the patient's skilled nursing facility and they are willing to take her back at this time with the knee immobilizer as well as the Ace wrap and  add a walker so that she may ambulate.  She will follow-up with orthopedics.  Husband is understanding of the diagnosis as well as treatment plan and willing to comply.  Head was rescanned secondary to possibility of this being a new injury there being a second fall. ____________________________________________   FINAL CLINICAL IMPRESSION(S) / ED DIAGNOSES  Patellar fracture.  NEW MEDICATIONS STARTED DURING THIS VISIT:  New Prescriptions   No medications on file     Note:  This document was prepared using Dragon voice recognition software and may include unintentional dictation errors.     Orbie Pyo, MD 08/15/18 1049

## 2018-08-26 ENCOUNTER — Other Ambulatory Visit: Payer: Self-pay | Admitting: Family Medicine

## 2018-08-26 NOTE — Telephone Encounter (Signed)
Last OV: 01/27/18

## 2018-08-30 DIAGNOSIS — S82014A Nondisplaced osteochondral fracture of right patella, initial encounter for closed fracture: Secondary | ICD-10-CM | POA: Diagnosis not present

## 2018-09-30 ENCOUNTER — Telehealth: Payer: Self-pay

## 2018-09-30 DIAGNOSIS — G309 Alzheimer's disease, unspecified: Principal | ICD-10-CM

## 2018-09-30 DIAGNOSIS — F0280 Dementia in other diseases classified elsewhere without behavioral disturbance: Secondary | ICD-10-CM

## 2018-09-30 NOTE — Telephone Encounter (Signed)
Please REFER her to neurologist, as it sounds like her dementia is progressing

## 2018-09-30 NOTE — Telephone Encounter (Signed)
Copied from [place] [id number]. Topic: Quick Communication - See Telephone Encounter >> Sep 30, 2018  9:33 AM Hewitt Shorts wrote: [place] is calling to see about getting pt on medication to help keep her focused or to help keep her from running around -this needs to be a scheduled not as needed   Best number [phone] cindy  Fax number for script is [phone]

## 2018-09-30 NOTE — Telephone Encounter (Signed)
Left detailed voicemail with cindy

## 2018-10-06 ENCOUNTER — Telehealth: Payer: Self-pay | Admitting: Family Medicine

## 2018-10-06 MED ORDER — RIVAROXABAN 15 MG PO TABS: ORAL_TABLET | ORAL | 5 refills | Status: DC

## 2018-10-06 NOTE — Telephone Encounter (Signed)
Copied from [place] [id number]. Topic: Quick Communication - Rx Refill/Question >> Oct 06, 2018 10:35 AM Bea Graff, NT wrote: Medication: XARELTO 15 MG TABS tablet and atorvastatin (LIPITOR) 20 MG tablet   Has the patient contacted their pharmacy? Yes.   (Agent: If no, request that the patient contact the pharmacy for the refill.) (Agent: If yes, when and what did the pharmacy advise?)  Preferred Pharmacy (with phone number or street name): [place], [place]. [address] [phone] (Phone) [phone] (Fax)    Agent: Please be advised that RX refills may take up to 3 business days. We ask that you follow-up with your pharmacy.

## 2018-10-07 ENCOUNTER — Telehealth: Payer: Self-pay

## 2018-10-07 NOTE — Telephone Encounter (Signed)
Copied from [place] [id number]. Topic: Quick Communication - See Telephone Encounter >> Oct 07, 2018  9:53 AM Marja Kays F wrote: Pt husband called stating that he has decided to cancel the neurology appt he does not feel it is needed to due to her dementia  Best number [phone]

## 2018-10-07 NOTE — Telephone Encounter (Signed)
I respect his choice

## 2018-10-15 DIAGNOSIS — Z23 Encounter for immunization: Secondary | ICD-10-CM | POA: Diagnosis not present

## 2018-10-26 ENCOUNTER — Ambulatory Visit (INDEPENDENT_AMBULATORY_CARE_PROVIDER_SITE_OTHER): Payer: Medicare Other | Admitting: Family Medicine

## 2018-10-26 ENCOUNTER — Encounter: Payer: Self-pay | Admitting: Family Medicine

## 2018-10-26 VITALS — BP 110/68 | HR 69 | Ht 62.0 in | Wt 144.4 lb

## 2018-10-26 DIAGNOSIS — M1A00X0 Idiopathic chronic gout, unspecified site, without tophus (tophi): Secondary | ICD-10-CM

## 2018-10-26 DIAGNOSIS — I10 Essential (primary) hypertension: Secondary | ICD-10-CM

## 2018-10-26 DIAGNOSIS — Z23 Encounter for immunization: Secondary | ICD-10-CM

## 2018-10-26 DIAGNOSIS — K2270 Barrett's esophagus without dysplasia: Secondary | ICD-10-CM

## 2018-10-26 DIAGNOSIS — F0280 Dementia in other diseases classified elsewhere without behavioral disturbance: Secondary | ICD-10-CM | POA: Diagnosis not present

## 2018-10-26 DIAGNOSIS — G309 Alzheimer's disease, unspecified: Secondary | ICD-10-CM | POA: Diagnosis not present

## 2018-10-26 NOTE — Patient Instructions (Signed)
She received the PCV-13 vaccine She will not need another pneumonia vaccine for the rest of her life

## 2018-10-26 NOTE — Progress Notes (Signed)
BP 110/68   Pulse 69   Ht 5\' 2"  (1.575 m)   Wt 144 lb 6.4 oz (65.5 kg)   SpO2 94%   BMI 26.41 kg/m    Subjective:    Patient ID: Jennifer Klein, female    DOB: 05/21/1941, 78 y.o.   MRN: [id number]  HPI: Jennifer Klein is a 77 y.o. female  Chief Complaint  Patient presents with  . Paperwork    FL2    HPI Patient is here from [facility] Due for FL-2 Completely ambulatory Does need a little assistance Primary reason for being there is the dementia, memory care unit Can feed herself for the most part, but needs help with set up No current sores per husband; long time ago Medicine for gout changed; no gout flares for a long time  Depression screen [facility] 2/9 01/27/2018 09/10/2017 06/02/2017 05/11/2017 01/19/2017  Decreased Interest 0 0 0 0 0  Down, Depressed, Hopeless 0 0 0 0 0  PHQ - 2 Score 0 0 0 0 0  Altered sleeping - - - - -  Tired, decreased energy - - - - -  Change in appetite - - - - -  Feeling bad or failure about yourself  - - - - -  Trouble concentrating - - - - -  Moving slowly or fidgety/restless - - - - -  Suicidal thoughts - - - - -  PHQ-9 Score - - - - -   Fall Risk  11/09/2018 10/26/2018 01/27/2018 09/10/2017 06/02/2017  Falls in the past year? 0 1 No Yes No  Number falls in past yr: - 1 - 2 or more -  Injury with Fall? - 1 - No -    Relevant past medical, surgical, family and social history reviewed Past Medical History:  Diagnosis Date  . Alzheimer's dementia with behavioral disturbance ([place]) 05/04/2016  . Alzheimer's dementia without behavioral disturbance ([place]) 05/04/2016  . B12 deficiency 05/06/2016  . Gout   . Hemorrhoids   . Hyperlipidemia LDL goal <100 05/05/2016  . Hypertension   . Memory loss   . PAF (paroxysmal atrial fibrillation) ([place])    a. on Xarelto; b. 48-hr Holter 2014: NSR with PAF/flutter which happened at least 2.26% of the time w/ associated tachycardia. Patient was asymptomatic; c. CHADS2VASc => 4 (HTN, age x 2, female)  .  Paroxysmal atrial fibrillation ([place]) 08/07/2013  . Senile osteoporosis   . Systolic dysfunction    a. echo 08/2013: EF 50-55%, mild MR, mildly dilated LA   Past Surgical History:  Procedure Laterality Date  . TUBAL LIGATION     Family History  Problem Relation Age of Onset  . Heart disease Father   . Gout Brother    Social History   Tobacco Use  . Smoking status: Former Smoker    Packs/day: 0.25    Years: 1.00    Pack years: 0.25    Types: Cigarettes  . Smokeless tobacco: Never Used  Substance Use Topics  . Alcohol use: No    Alcohol/week: 0.0 standard drinks  . Drug use: No     Office Visit from 10/26/2018 in [facility]  AUDIT-C Score  0      Interim medical history since last visit reviewed. Allergies and medications reviewed  Review of Systems Per HPI unless specifically indicated above     Objective:    BP 110/68   Pulse 69   Ht 5\' 2"  (1.575 m)   Wt  144 lb 6.4 oz (65.5 kg)   SpO2 94%   BMI 26.41 kg/m     Physical Exam  Constitutional: She appears well-developed and well-nourished.  HENT:  Mouth/Throat: Mucous membranes are normal.  Eyes: EOM are normal. No scleral icterus.  Cardiovascular: Normal rate and regular rhythm.  Pulmonary/Chest: Effort normal and breath sounds normal.  Psychiatric: Her mood appears anxious. Cognition and memory are impaired.  Progressive dementia; does not recognize examiner; unable to give any meaningful history       Assessment & Plan:   Problem List Items Addressed This Visit      Cardiovascular and Mediastinum   Hypertension    Continue medicine; controlled        Digestive   Barrett's esophagus    Continue PPI indefinitely        Nervous and Auditory   Alzheimer's dementia without behavioral disturbance (HCC) - Primary    Some anxiety, but not combative; FL-2 form completed; requiring 24/7 memory care; gradual declining appreciated; husband does not wish her to see neurologist         Other   Gout    Doing well on current medicine       Other Visit Diagnoses    Need for influenza vaccination       Need for vaccination with 13-polyvalent pneumococcal conjugate vaccine       Relevant Orders   Pneumococcal conjugate vaccine 13-valent IM (Completed)       Follow up plan: Return in about 3 months (around 01/26/2019) for follow-up visit with Dr. Sanda Klein.  An after-visit summary was printed and given to the patient at [facility].  Please see the patient instructions which may contain other information and recommendations beyond what is mentioned above in the assessment and plan.  No orders of the defined types were placed in this encounter.   Orders Placed This Encounter  Procedures  . Pneumococcal conjugate vaccine 13-valent IM

## 2018-10-27 ENCOUNTER — Other Ambulatory Visit: Payer: Self-pay

## 2018-10-27 MED ORDER — LORAZEPAM 0.5 MG PO TABS: 0.2500 mg | ORAL_TABLET | Freq: Two times a day (BID) | ORAL | 2 refills | Status: DC | PRN

## 2018-10-27 MED ORDER — DONEPEZIL HCL 10 MG PO TABS: ORAL_TABLET | ORAL | 11 refills | Status: DC

## 2018-10-27 MED ORDER — PANTOPRAZOLE SODIUM 40 MG PO TBEC: 40.0000 mg | DELAYED_RELEASE_TABLET | Freq: Every day | ORAL | 5 refills | Status: DC

## 2018-10-27 MED ORDER — MEMANTINE HCL 10 MG PO TABS: 10.0000 mg | ORAL_TABLET | Freq: Two times a day (BID) | ORAL | 5 refills | Status: DC

## 2018-10-27 MED ORDER — ALLOPURINOL 100 MG PO TABS: 100.0000 mg | ORAL_TABLET | Freq: Every day | ORAL | 5 refills | Status: DC

## 2018-11-03 ENCOUNTER — Other Ambulatory Visit: Payer: Self-pay | Admitting: Family Medicine

## 2018-11-03 NOTE — Telephone Encounter (Signed)
Pharmacy sent a note with refill request for metoprolol which is prescribed by Dr. Fletcher Anon but sent it to me saying I'm her PCP I sent it back letting them know that I am here PCP, but Dr. Fletcher Anon prescribes this medicine Can you call them Friday morning and confirm that they will contact Dr. Fletcher Anon for this medicine please? Thank you

## 2018-11-04 ENCOUNTER — Other Ambulatory Visit: Payer: Self-pay

## 2018-11-04 MED ORDER — METOPROLOL TARTRATE 25 MG PO TABS: 25.0000 mg | ORAL_TABLET | Freq: Two times a day (BID) | ORAL | 3 refills | Status: DC

## 2018-11-04 NOTE — Telephone Encounter (Signed)
Notified they sent to Dr. Fletcher Anon

## 2018-11-05 ENCOUNTER — Other Ambulatory Visit: Payer: Self-pay

## 2018-11-05 ENCOUNTER — Encounter: Payer: Self-pay | Admitting: Emergency Medicine

## 2018-11-05 ENCOUNTER — Emergency Department
Admission: EM | Admit: 2018-11-05 | Discharge: 2018-11-05 | Disposition: A | Discharge status: Home / Self Care | Payer: Medicare Other | Attending: Emergency Medicine | Admitting: Emergency Medicine

## 2018-11-05 DIAGNOSIS — X58XXXA Exposure to other specified factors, initial encounter: Secondary | ICD-10-CM | POA: Insufficient documentation

## 2018-11-05 DIAGNOSIS — I129 Hypertensive chronic kidney disease with stage 1 through stage 4 chronic kidney disease, or unspecified chronic kidney disease: Secondary | ICD-10-CM | POA: Diagnosis not present

## 2018-11-05 DIAGNOSIS — Y999 Unspecified external cause status: Secondary | ICD-10-CM | POA: Insufficient documentation

## 2018-11-05 DIAGNOSIS — G308 Other Alzheimer's disease: Secondary | ICD-10-CM | POA: Insufficient documentation

## 2018-11-05 DIAGNOSIS — Y939 Activity, unspecified: Secondary | ICD-10-CM | POA: Diagnosis not present

## 2018-11-05 DIAGNOSIS — Z87891 Personal history of nicotine dependence: Secondary | ICD-10-CM | POA: Insufficient documentation

## 2018-11-05 DIAGNOSIS — S91209A Unspecified open wound of unspecified toe(s) with damage to nail, initial encounter: Secondary | ICD-10-CM

## 2018-11-05 DIAGNOSIS — S91104A Unspecified open wound of right lesser toe(s) without damage to nail, initial encounter: Secondary | ICD-10-CM | POA: Diagnosis not present

## 2018-11-05 DIAGNOSIS — Z79899 Other long term (current) drug therapy: Secondary | ICD-10-CM | POA: Insufficient documentation

## 2018-11-05 DIAGNOSIS — N183 Chronic kidney disease, stage 3 (moderate): Secondary | ICD-10-CM | POA: Insufficient documentation

## 2018-11-05 DIAGNOSIS — S91205A Unspecified open wound of left lesser toe(s) with damage to nail, initial encounter: Secondary | ICD-10-CM | POA: Diagnosis not present

## 2018-11-05 DIAGNOSIS — F0280 Dementia in other diseases classified elsewhere without behavioral disturbance: Secondary | ICD-10-CM | POA: Diagnosis not present

## 2018-11-05 DIAGNOSIS — Z7901 Long term (current) use of anticoagulants: Secondary | ICD-10-CM | POA: Diagnosis not present

## 2018-11-05 DIAGNOSIS — Y929 Unspecified place or not applicable: Secondary | ICD-10-CM | POA: Insufficient documentation

## 2018-11-05 DIAGNOSIS — S90413A Abrasion, unspecified great toe, initial encounter: Secondary | ICD-10-CM | POA: Diagnosis not present

## 2018-11-05 DIAGNOSIS — S99922A Unspecified injury of left foot, initial encounter: Secondary | ICD-10-CM | POA: Diagnosis present

## 2018-11-05 DIAGNOSIS — R4182 Altered mental status, unspecified: Secondary | ICD-10-CM | POA: Diagnosis not present

## 2018-11-05 DIAGNOSIS — Z743 Need for continuous supervision: Secondary | ICD-10-CM | POA: Diagnosis not present

## 2018-11-05 MED ORDER — LIDOCAINE-PRILOCAINE 2.5-2.5 % EX CREA
TOPICAL_CREAM | Freq: Once | CUTANEOUS | Status: AC
  Administered 2018-11-05: 16:00:00 via TOPICAL
  Filled 2018-11-05: qty 5

## 2018-11-05 NOTE — ED Notes (Signed)
Pt unable to sign. MD Archie Balboa called and spoke with husband. Report called and given to [place] at [facility].

## 2018-11-05 NOTE — ED Triage Notes (Signed)
Pt presents to ED via [facility] with c/o L toe pain. Per EMS pt's husband wanted patient sent for eval of L 5th toe, toenail is attached at the cuticle, pt's husband also requesting toenails of bilateral 4th toe to be cut. Pt with hx of dementia.

## 2018-11-05 NOTE — ED Notes (Signed)
Cream applied to pt's toe. Bandages in place

## 2018-11-05 NOTE — Discharge Instructions (Addendum)
Please seek medical attention for any high fevers, chest pain, shortness of breath, change in behavior, persistent vomiting, bloody stool or any other new or concerning symptoms.  

## 2018-11-05 NOTE — ED Notes (Signed)
Pt has left pinkie toe that appears to be pulled away from the nail bed. No bleeding noted. Nail still attached. Pt also has right 4th toe that looks like it was stumped. No bleeding noted. Purple discoloration under nail.

## 2018-11-05 NOTE — ED Provider Notes (Signed)
[facility] Emergency Department Provider Note    ____________________________________________   I have reviewed the triage vital signs and the nursing notes.   HISTORY  Chief Complaint Toe Pain   History limited by and level 5 caveat due to: Dementia, some history obtained from husband over the phone  HPI Jennifer Klein is a 77 y.o. female who presents to the emergency department today for apparent left fifth toe concern. Unclear if any traumatic injury occurred to the toe. Patient is unable to give any history. In discussion with the husband he was apparently not aware that the patient was being transported to the emergency department. He does state there is some concern that her toenail might come off. Does not think any significant trauma occurred.   Per medical record review patient has a history of dementia.   Past Medical History:  Diagnosis Date  . Alzheimer's dementia with behavioral disturbance ([place]) 05/04/2016  . Alzheimer's dementia without behavioral disturbance ([place]) 05/04/2016  . B12 deficiency 05/06/2016  . Gout   . Hemorrhoids   . Hyperlipidemia LDL goal <100 05/05/2016  . Hypertension   . Memory loss   . PAF (paroxysmal atrial fibrillation) ([place])    a. on Xarelto; b. 48-hr Holter 2014: NSR with PAF/flutter which happened at least 2.26% of the time w/ associated tachycardia. Patient was asymptomatic; c. CHADS2VASc => 4 (HTN, age x 2, female)  . Paroxysmal atrial fibrillation ([place]) 08/07/2013  . Senile osteoporosis   . Systolic dysfunction    a. echo 08/2013: EF 50-55%, mild MR, mildly dilated LA    Patient Active Problem List   Diagnosis Date Noted  . Sepsis ([place]) 06/23/2018  . Fall 09/10/2017  . PAD (peripheral artery disease) ([place]) 06/22/2017  . Chronic venous insufficiency 06/22/2017  . CKD (chronic kidney disease) stage 3, GFR 30-59 ml/min (HCC) 09/04/2016  . B12 deficiency 05/06/2016  . Hyperlipidemia LDL goal <100 05/05/2016  .  Alzheimer's dementia without behavioral disturbance ([place]) 05/04/2016  . Medication monitoring encounter 05/04/2016  . Barrett's esophagus 11/09/2015  . Paroxysmal atrial fibrillation ([place]) 08/07/2013  . Osteopenia 08/08/2012  . Hypertension 06/03/2012  . Gout 06/03/2012    Past Surgical History:  Procedure Laterality Date  . TUBAL LIGATION      Prior to Admission medications   Medication Sig Start Date End Date Taking? Authorizing Provider  acetaminophen (TYLENOL) 500 MG tablet Take 500 mg by mouth every 6 (six) hours as needed.    [provider]  allopurinol (ZYLOPRIM) 100 MG tablet Take 1 tablet (100 mg total) by mouth daily. 10/27/18   Arnetha Courser, MD  alum & mag hydroxide-simeth (MI-ACID) 200-200-20 MG/5ML suspension Take 30 mLs by mouth every 6 (six) hours as needed for indigestion or heartburn.    [provider]  atorvastatin (LIPITOR) 20 MG tablet TAKE 1 TABLET BY MOUTH AT BEDTIME 06/07/18   Lada, Satira Anis, MD  busPIRone (BUSPAR) 5 MG tablet TAKE 1 TABLET BY MOUTH 2 TIMES DAILY FORDEPRESSION Patient taking differently: Take 5 mg by mouth 2 (two) times daily.  05/30/18   Arnetha Courser, MD  cholecalciferol (VITAMIN D) 1000 units tablet TAKE 1 TABLET BY MOUTH ONCE DAILY 04/13/18   Lada, Satira Anis, MD  Cyanocobalamin (VITAMIN B-12) 500 MCG SUBL One by mouth daily 01/27/18   Lada, Satira Anis, MD  donepezil (ARICEPT) 10 MG tablet TAKE 1 TABLET BY MOUTH BEDTIME 10/27/18   Lada, Satira Anis, MD  guaifenesin (ROBITUSSIN) 100 MG/5ML syrup Take  100 mg by mouth 4 (four) times daily as needed for cough.    [provider]  loperamide (IMODIUM) 2 MG capsule Take 2 mg by mouth as needed for diarrhea or loose stools.    [provider]  LORazepam (ATIVAN) 0.5 MG tablet Take 0.5 tablets (0.25 mg total) by mouth 2 (two) times daily as needed for anxiety. 10/27/18   Arnetha Courser, MD  magnesium hydroxide (MILK OF MAGNESIA) 400 MG/5ML suspension Take 30 mLs by mouth  daily as needed for mild constipation.    [provider]  memantine (NAMENDA) 10 MG tablet Take 1 tablet (10 mg total) by mouth 2 (two) times daily. 10/27/18   Arnetha Courser, MD  metoprolol tartrate (LOPRESSOR) 25 MG tablet Take 1 tablet (25 mg total) by mouth 2 (two) times daily. 11/04/18   Wellington Hampshire, MD  neomycin-bacitracin-polymyxin (NEOSPORIN) [phone] ointment Apply topically 4 (four) times daily.    [provider]  pantoprazole (PROTONIX) 40 MG tablet Take 1 tablet (40 mg total) by mouth daily. 10/27/18   Arnetha Courser, MD  Rivaroxaban (XARELTO) 15 MG TABS tablet TAKE 1 TABLET BY MOUTH DAILY WITH SUPPER 10/06/18   Lada, Satira Anis, MD    Allergies Darvon [propoxyphene hcl]  Family History  Problem Relation Age of Onset  . Heart disease Father   . Gout Brother     Social History Social History   Tobacco Use  . Smoking status: Former Smoker    Packs/day: 0.25    Years: 1.00    Pack years: 0.25    Types: Cigarettes  . Smokeless tobacco: Never Used  Substance Use Topics  . Alcohol use: No    Alcohol/week: 0.0 standard drinks  . Drug use: No    Review of Systems Unable to obtain secondary to dementia ____________________________________________   PHYSICAL EXAM:  VITAL SIGNS: ED Triage Vitals  Enc Vitals Group     BP 11/05/18 1516 (!) 144/109     Pulse Rate 11/05/18 1516 (!) 101     Resp --      Temp 11/05/18 1516 97.6 F (36.4 C)     Temp Source 11/05/18 1516 Axillary     SpO2 11/05/18 1516 98 %     Weight 11/05/18 1514 144 lb 6.4 oz (65.5 kg)     Height 11/05/18 1514 5\' 2"  (1.575 m)   Constitutional: Awake and alert. Demented.  Eyes: Conjunctivae are normal.  ENT      Head: Normocephalic and atraumatic.      Nose: No congestion/rhinnorhea.      Mouth/Throat: Mucous membranes are moist.      Neck: No stridor. Hematological/Lymphatic/Immunilogical: No cervical lymphadenopathy. Cardiovascular: Normal rate, regular rhythm.    Respiratory: Normal respiratory effort without tachypnea nor retractions. Breath sounds are clear and equal bilaterally. No wheezes/rales/rhonchi. Gastrointestinal: Soft and non tender. No rebound. No guarding.  Genitourinary: Deferred Musculoskeletal: Left fifth toenail bent up off of the quick. Appears attached to the cuticle. No active bleeding.  Neurologic:  Dementia. Moving all extremities.  Skin:  Skin is warm, dry and intact. No rash noted. ____________________________________________    LABS (pertinent positives/negatives)  None  ____________________________________________   EKG  None  ____________________________________________    RADIOLOGY  None  ____________________________________________   PROCEDURES  Procedures  ____________________________________________   INITIAL IMPRESSION / ASSESSMENT AND PLAN / ED COURSE  Pertinent labs & imaging results that were available during my care of the patient were reviewed by me and considered  in my medical decision making (see chart for details).   Patient sent to the emergency department today from living facility because of concerns for nail injury to the left fifth toe.  The patient's toenail appears to still be attached at the cuticle.  It has somewhat avulsed from the nail bed.  Discussed with the husband who had not been aware that the patient was sent to the emergency department.  He does not have concerns for a fracture and did not want her to have an x-ray.  Did discuss that podiatry would be the appropriate physician to follow-up with.   ____________________________________________   FINAL CLINICAL IMPRESSION(S) / ED DIAGNOSES  Final diagnoses:  Avulsion of toenail, initial encounter     Note: This dictation was prepared with Dragon dictation. Any transcriptional errors that result from this process are unintentional     Nance Pear, MD 11/05/18 [phone]

## 2018-11-07 ENCOUNTER — Other Ambulatory Visit: Payer: Self-pay

## 2018-11-07 ENCOUNTER — Emergency Department: Payer: Medicare Other

## 2018-11-07 ENCOUNTER — Emergency Department
Admission: EM | Admit: 2018-11-07 | Discharge: 2018-11-07 | Disposition: A | Discharge status: Home / Self Care | Payer: Medicare Other | Attending: Emergency Medicine | Admitting: Emergency Medicine

## 2018-11-07 DIAGNOSIS — I129 Hypertensive chronic kidney disease with stage 1 through stage 4 chronic kidney disease, or unspecified chronic kidney disease: Secondary | ICD-10-CM | POA: Diagnosis not present

## 2018-11-07 DIAGNOSIS — F0280 Dementia in other diseases classified elsewhere without behavioral disturbance: Secondary | ICD-10-CM | POA: Diagnosis not present

## 2018-11-07 DIAGNOSIS — Z7901 Long term (current) use of anticoagulants: Secondary | ICD-10-CM | POA: Diagnosis not present

## 2018-11-07 DIAGNOSIS — G309 Alzheimer's disease, unspecified: Secondary | ICD-10-CM | POA: Insufficient documentation

## 2018-11-07 DIAGNOSIS — R4182 Altered mental status, unspecified: Secondary | ICD-10-CM

## 2018-11-07 DIAGNOSIS — N183 Chronic kidney disease, stage 3 (moderate): Secondary | ICD-10-CM | POA: Insufficient documentation

## 2018-11-07 DIAGNOSIS — Z87891 Personal history of nicotine dependence: Secondary | ICD-10-CM | POA: Insufficient documentation

## 2018-11-07 DIAGNOSIS — N3000 Acute cystitis without hematuria: Secondary | ICD-10-CM | POA: Diagnosis not present

## 2018-11-07 DIAGNOSIS — Z743 Need for continuous supervision: Secondary | ICD-10-CM | POA: Diagnosis not present

## 2018-11-07 DIAGNOSIS — R0989 Other specified symptoms and signs involving the circulatory and respiratory systems: Secondary | ICD-10-CM | POA: Diagnosis not present

## 2018-11-07 LAB — URINALYSIS, COMPLETE (UACMP) WITH MICROSCOPIC
Bacteria, UA: NONE SEEN
Bilirubin Urine: NEGATIVE
Glucose, UA: NEGATIVE mg/dL
Ketones, ur: NEGATIVE mg/dL
Nitrite: NEGATIVE
PH: 6 (ref 5.0–8.0)
Protein, ur: 30 mg/dL — AB
SPECIFIC GRAVITY, URINE: 1.017 (ref 1.005–1.030)
WBC, UA: >50 WBC/hpf — ABNORMAL HIGH (ref 0–5)

## 2018-11-07 LAB — CBC
HCT: 41.6 % (ref 36.0–46.0)
Hemoglobin: 13.2 g/dL (ref 12.0–15.0)
MCH: 29.9 pg (ref 26.0–34.0)
MCHC: 31.7 g/dL (ref 30.0–36.0)
MCV: 94.1 fL (ref 80.0–100.0)
NRBC: 0 % (ref 0.0–0.2)
Platelets: 284 10*3/uL (ref 150–400)
RBC: 4.42 MIL/uL (ref 3.87–5.11)
RDW: 13.3 % (ref 11.5–15.5)
WBC: 7.4 10*3/uL (ref 4.0–10.5)

## 2018-11-07 LAB — COMPREHENSIVE METABOLIC PANEL
ALK PHOS: 103 U/L (ref 38–126)
ALT: 11 U/L (ref 0–44)
ANION GAP: 7 (ref 5–15)
AST: 17 U/L (ref 15–41)
Albumin: 3.5 g/dL (ref 3.5–5.0)
BILIRUBIN TOTAL: 0.9 mg/dL (ref 0.3–1.2)
BUN: 19 mg/dL (ref 8–23)
CALCIUM: 8.4 mg/dL — AB (ref 8.9–10.3)
CO2: 26 mmol/L (ref 22–32)
Chloride: 107 mmol/L (ref 98–111)
Creatinine, Ser: 1.09 mg/dL — ABNORMAL HIGH (ref 0.44–1.00)
GFR calc non Af Amer: 47 mL/min — ABNORMAL LOW (ref >60)
GFR, EST AFRICAN AMERICAN: 54 mL/min — AB (ref >60)
Glucose, Bld: 135 mg/dL — ABNORMAL HIGH (ref 70–99)
Potassium: 3.7 mmol/L (ref 3.5–5.1)
Sodium: 140 mmol/L (ref 135–145)
TOTAL PROTEIN: 6.5 g/dL (ref 6.5–8.1)

## 2018-11-07 LAB — TROPONIN I

## 2018-11-07 LAB — AMMONIA: Ammonia: 11 umol/L (ref 9–35)

## 2018-11-07 MED ORDER — SULFAMETHOXAZOLE-TRIMETHOPRIM 800-160 MG PO TABS: 1.0000 | ORAL_TABLET | Freq: Two times a day (BID) | ORAL | 0 refills | Status: AC

## 2018-11-07 MED ORDER — SODIUM CHLORIDE 0.9 % IV SOLN
1.0000 g | Freq: Once | INTRAVENOUS | Status: AC
  Administered 2018-11-07: 1 g via INTRAVENOUS
  Filled 2018-11-07: qty 10

## 2018-11-07 MED ORDER — SULFAMETHOXAZOLE-TRIMETHOPRIM 800-160 MG PO TABS: 1.0000 | ORAL_TABLET | Freq: Two times a day (BID) | ORAL | 0 refills | Status: DC

## 2018-11-07 NOTE — ED Notes (Signed)
Patient transported to CT 

## 2018-11-07 NOTE — ED Triage Notes (Signed)
Pt comes into the ED via EMS from [facility] with c/o from staff that the pt has not been acting her normal baseline over the weekend. CBG 135, 98.7temp axillary per EMS. Pt is alert, disoriented to name, place and time on arrival. No noted skin breakdown.

## 2018-11-07 NOTE — Discharge Instructions (Signed)
Please take the entire course of antibiotics, even if you are feeling better.  Return to the emergency department if you develop severe pain, changes in mental status, nausea or vomiting, fever, or any other symptoms concerning to you.

## 2018-11-07 NOTE — ED Notes (Signed)
[facility]  CALLED  FOR  TRANSPORT 

## 2018-11-07 NOTE — ED Provider Notes (Signed)
[facility] Emergency Department Provider Note  ____________________________________________  Time seen: Approximately 8:05 AM  I have reviewed the triage vital signs and the nursing notes.   HISTORY  Chief Complaint Altered Mental Status    HPI Jennifer Klein is a 77 y.o. female history of paroxysmal A. fib, Alzheimer's dementia, presenting for altered mental status.  The patient is unable to give any history as all she does is mumble and saying.  Per report, the patient lives at a nursing facility and "has not been acting herself this weekend."  Upon arrival to the emergency department, the patient is hemodynamically stable but does keep reaching her right hand over her perineum.  Past Medical History:  Diagnosis Date  . Alzheimer's dementia with behavioral disturbance ([place]) 05/04/2016  . Alzheimer's dementia without behavioral disturbance ([place]) 05/04/2016  . B12 deficiency 05/06/2016  . Gout   . Hemorrhoids   . Hyperlipidemia LDL goal <100 05/05/2016  . Hypertension   . Memory loss   . PAF (paroxysmal atrial fibrillation) ([place])    a. on Xarelto; b. 48-hr Holter 2014: NSR with PAF/flutter which happened at least 2.26% of the time w/ associated tachycardia. Patient was asymptomatic; c. CHADS2VASc => 4 (HTN, age x 2, female)  . Paroxysmal atrial fibrillation ([place]) 08/07/2013  . Senile osteoporosis   . Systolic dysfunction    a. echo 08/2013: EF 50-55%, mild MR, mildly dilated LA    Patient Active Problem List   Diagnosis Date Noted  . Sepsis ([place]) 06/23/2018  . Fall 09/10/2017  . PAD (peripheral artery disease) ([place]) 06/22/2017  . Chronic venous insufficiency 06/22/2017  . CKD (chronic kidney disease) stage 3, GFR 30-59 ml/min (HCC) 09/04/2016  . B12 deficiency 05/06/2016  . Hyperlipidemia LDL goal <100 05/05/2016  . Alzheimer's dementia without behavioral disturbance ([place]) 05/04/2016  . Medication monitoring encounter 05/04/2016  . Barrett's esophagus  11/09/2015  . Paroxysmal atrial fibrillation ([place]) 08/07/2013  . Osteopenia 08/08/2012  . Hypertension 06/03/2012  . Gout 06/03/2012    Past Surgical History:  Procedure Laterality Date  . TUBAL LIGATION      Current Outpatient Rx  . Order #: [id number] Class: Historical Med  . Order #: [id number] Class: Normal  . Order #: [id number] Class: Historical Med  . Order #: [id number] Class: Normal  . Order #: [id number] Class: Normal  . Order #: [id number] Class: Normal  . Order #: [id number] Class: Normal  . Order #: [id number] Class: Normal  . Order #: [id number] Class: Historical Med  . Order #: [id number] Class: Historical Med  . Order #: [id number] Class: Normal  . Order #: [id number] Class: Historical Med  . Order #: [id number] Class: Normal  . Order #: [id number] Class: Normal  . Order #: [id number] Class: Historical Med  . Order #: [id number] Class: Normal  . Order #: [id number] Class: Normal  . Order #: [id number] Class: Normal    Allergies Darvon [propoxyphene hcl]  Family History  Problem Relation Age of Onset  . Heart disease Father   . Gout Brother     Social History Social History   Tobacco Use  . Smoking status: Former Smoker    Packs/day: 0.25    Years: 1.00    Pack years: 0.25    Types: Cigarettes  . Smokeless tobacco: Never Used  Substance Use Topics  . Alcohol use: No    Alcohol/week: 0.0 standard drinks  . Drug use: No    Review of Systems Able to obtain due to patient dementia.   ____________________________________________   PHYSICAL EXAM:  VITAL SIGNS: ED Triage Vitals  Enc  Vitals Group     BP 11/07/18 0802 126/77     Pulse Rate 11/07/18 0802 75     Resp 11/07/18 0802 16     Temp 11/07/18 0802 97.8 F (36.6 C)     Temp Source 11/07/18 0802 Rectal     SpO2 11/07/18 0802 99 %     Weight 11/07/18 0803 150 lb 6.4 oz (68.2 kg)     Height 11/07/18 0803 5\' 4"  (1.626 m)     Head Circumference --      Peak Flow --      Pain Score --      Pain Loc --      Pain  Edu? --      Excl. in [place]? --     Constitutional: The patient is alert, mumbling and singing.  She does not follow commands. Eyes: Conjunctivae are normal.  EOMI. PERRLA.  No scleral icterus.  No raccoon eyes. Head: Atraumatic.  No battle sign. Nose: No congestion/rhinnorhea. Mouth/Throat: Mucous membranes are moist.  Neck: No stridor.  Supple.  No JVD.  No meningismus. Cardiovascular: Normal rate, regular rhythm. No murmurs, rubs or gallops.  Respiratory: Normal respiratory effort.  No accessory muscle use or retractions. Lungs CTAB.  No wheezes, rales or ronchi. Gastrointestinal: Soft, nontender and nondistended.  No guarding or rebound.  No peritoneal signs. Musculoskeletal: No LE edema.  Neurologic: alert and protecting her airway. Face and smile are symmetric.  EOMI.  Moves all extremities well. Skin:  Skin is warm, dry and intact. No rash noted. Psychiatric: Unable to assess due to inability to interact.  The patient is calm and cooperative on my examination.  ____________________________________________   LABS (all labs ordered are listed, but only abnormal results are displayed)  Labs Reviewed  COMPREHENSIVE METABOLIC PANEL - Abnormal; Notable for the following components:      Result Value   Glucose, Bld 135 (*)    Creatinine, Ser 1.09 (*)    Calcium 8.4 (*)    GFR calc non Af Amer 47 (*)    GFR calc Af Amer 54 (*)    All other components within normal limits  URINALYSIS, COMPLETE (UACMP) WITH MICROSCOPIC - Abnormal; Notable for the following components:   Color, Urine AMBER (*)    APPearance CLOUDY (*)    Hgb urine dipstick SMALL (*)    Protein, ur 30 (*)    Leukocytes, UA MODERATE (*)    WBC, UA >50 (*)    All other components within normal limits  URINE CULTURE  TROPONIN I  CBC  AMMONIA   ____________________________________________  EKG  ED ECG REPORT I, Anne-Caroline Mariea Clonts, the attending physician, personally viewed and interpreted this ECG.   Date:  11/07/2018  EKG Time: 748  Rate: 75  Rhythm: afib, well controlled  Axis: normal  Intervals:none  ST&T Change: No STEMI; poor baseline tracing  ____________________________________________  RADIOLOGY  Dg Chest 2 View  Result Date: 11/07/2018 CLINICAL DATA:  Pt comes into the ED via EMS from [facility] with c/o from staff that the pt has not been acting her normal baseline over the weekend, history of Alzheimer's, hypertension, A-Fib, best possible images obtained due to patient conditionams EXAM: CHEST - 2 VIEW COMPARISON:  None. FINDINGS: Normal mediastinum and cardiac silhouette. Normal pulmonary vasculature. No evidence of effusion, infiltrate, or pneumothorax. No acute bony abnormality. Atherosclerotic calcification of the aorta. IMPRESSION: No acute cardiopulmonary process. Electronically Signed   By: Suzy Bouchard M.D.   On: 11/07/2018  09:07   Ct Head Wo Contrast  Result Date: 11/07/2018 CLINICAL DATA:  Altered mental status compared to baseline. History of underlying dementia. EXAM: CT HEAD WITHOUT CONTRAST TECHNIQUE: Contiguous axial images were obtained from the base of the skull through the vertex without intravenous contrast. COMPARISON:  August 15, 2018 FINDINGS: Brain: Moderate diffuse atrophy is stable. There is no intracranial mass, hemorrhage, extra-axial fluid collection, or midline shift. There is patchy small vessel disease in the centra semiovale bilaterally. There is a prior lacunar infarct at the genu of the right internal capsule. There is a nearby small lacunar infarct in the midportion right lentiform nucleus. There is a small lacunar infarct in the mid left cerebellum. No acute infarct is demonstrable on this study. Vascular: There is no hyperdense vessel evident. There is calcification in each carotid siphon region. Skull: The bony calvarium appears intact. Sinuses/Orbits: Visualized paranasal sinuses are clear. Visualized orbits appear symmetric bilaterally.  Other: Visualized mastoid air cells are clear. There is debris in the right external auditory canal. IMPRESSION: Atrophy with periventricular small vessel disease. Prior scattered small lacunar infarcts, stable. No acute infarct evident. No mass or hemorrhage. There are foci of arterial vascular calcification. There is probable cerumen in the right external auditory canal. Electronically Signed   By: Lowella Grip III M.D.   On: 11/07/2018 08:23    ____________________________________________   PROCEDURES  Procedure(s) performed: None  Procedures  Critical Care performed: No ____________________________________________   INITIAL IMPRESSION / ASSESSMENT AND PLAN / ED COURSE  Pertinent labs & imaging results that were available during my care of the patient were reviewed by me and considered in my medical decision making (see chart for details).  77 y.o. female history of Alzheimer's dementia for altered mental status.  Here, the patient is hemodynamically stable and has no focal deficits all other than abnormal mental status and orientation on my exam.  We will plan to check her for UTI, and additionally obtain basic laboratory studies, CT head and chest x-ray as the patient is unable to give a clear history today.  Plan reevaluation for final disposition.  ----------------------------------------- 9:21 AM on 11/07/2018 -----------------------------------------  Patient is work-up in the emergency department has been reassuring.  She continues to be hemodynamically stable and afebrile.  Her urinalysis shows some white blood cells and leukocyte esterase; no bacteria are seen but I have sent the sample for culture and treated the patient with a dose of IV ceftriaxone.  In addition, the patient has some mild renal insufficiency and has been treated with intravenous fluids.  Her remaining laboratory studies, CT of the head, and chest x-ray do not show any acute abnormalities.  We will plan  to discharge the patient back to her nursing facility with a 7-day course of oral Bactrim.  We have contacted the facility to give him specific instructions.  Plan discharge at this time.  Follow-up instructions as well as return precautions were written in her discharge paperwork.  ____________________________________________  FINAL CLINICAL IMPRESSION(S) / ED DIAGNOSES  Final diagnoses:  Altered mental status, unspecified altered mental status type  Acute cystitis without hematuria         NEW MEDICATIONS STARTED DURING THIS VISIT:  New Prescriptions   SULFAMETHOXAZOLE-TRIMETHOPRIM (BACTRIM DS,SEPTRA DS) 800-160 MG TABLET    Take 1 tablet by mouth 2 (two) times daily for 7 days.      Eula Listen, MD 11/07/18 [phone]

## 2018-11-07 NOTE — ED Notes (Signed)
Spoke with staff at [facility] to get a baseline on the pt. Pt will be discharged back to the facility with a prescription of abx. Staff informed of results and report given.

## 2018-11-09 ENCOUNTER — Ambulatory Visit (INDEPENDENT_AMBULATORY_CARE_PROVIDER_SITE_OTHER): Payer: Medicare Other | Admitting: Nurse Practitioner

## 2018-11-09 ENCOUNTER — Encounter: Payer: Self-pay | Admitting: Nurse Practitioner

## 2018-11-09 ENCOUNTER — Telehealth: Payer: Self-pay | Admitting: Family Medicine

## 2018-11-09 VITALS — BP 100/70 | HR 64 | Temp 97.6°F | Resp 16 | Ht 64.0 in | Wt 141.9 lb

## 2018-11-09 DIAGNOSIS — N3001 Acute cystitis with hematuria: Secondary | ICD-10-CM

## 2018-11-09 DIAGNOSIS — B379 Candidiasis, unspecified: Secondary | ICD-10-CM

## 2018-11-09 DIAGNOSIS — T3695XA Adverse effect of unspecified systemic antibiotic, initial encounter: Secondary | ICD-10-CM

## 2018-11-09 MED ORDER — LORAZEPAM 0.5 MG PO TABS: 0.2500 mg | ORAL_TABLET | Freq: Two times a day (BID) | ORAL | 2 refills | Status: DC

## 2018-11-09 MED ORDER — FLUCONAZOLE 150 MG PO TABS: 150.0000 mg | ORAL_TABLET | Freq: Once | ORAL | 0 refills | Status: AC

## 2018-11-09 NOTE — Telephone Encounter (Signed)
faxed

## 2018-11-09 NOTE — Telephone Encounter (Signed)
New Rx ready; lorazepam changed today from PRN to routine

## 2018-11-09 NOTE — Patient Instructions (Signed)
-  We are switching lorazepam 0.25mg  twice a day as needed to just twice a day.  -Please continue the full 7 days course of bactrim until completed - Encourage water intake - Can give fluconazole 150mg  one time if noted vaginal itching with patient, to be administered after the completion of antibiotic- bactrim.

## 2018-11-09 NOTE — Telephone Encounter (Signed)
Copied from [place] [id number]. Topic: General - Other >> Nov 09, 2018 11:49 AM Lennox Solders wrote: Reason for CRM: [facility] calling they needs written rx for lorazepam to be fax to them at [phone]

## 2018-11-09 NOTE — Progress Notes (Signed)
Name: Jennifer Klein   MRN: [id number]    DOB: [birth year]   Date:11/09/2018       Progress Note  Subjective  Chief Complaint  Chief Complaint  Patient presents with  . Hospitalization Follow-up    acute cystits    HPI Patient is at [facility] care unit.   Patient with advanced demential presents for ER follow-up was seen on 11/07/2018 for altered mental status. Urine showed UTI- treated with IV rocephin and IV fluids sent home on bactrim BID for 7 days. She presents today with transporter from care house; who states she is at her baseline.  Called to speak with med tech that works with patient [phone]. [place]; erratic behavior, not eating as much, grabbing at things this has been progressive over the past 6 months or so, no acute changes. No guarding, no bowel or bladder changes. No vaginal discharge or coughing.   ____ No anemia, elevation in WBC, electrolytes stable at baseline, normal ammonia levels, troponin and liver enzymes. Kidney function declined.   ______  CT HEAD IMPRESSION: Atrophy with periventricular small vessel disease. Prior scattered small lacunar infarcts, stable. No acute infarct evident. No mass or hemorrhage.  There are foci of arterial vascular calcification. There is probable cerumen in the right external auditory canal. _____  DG CHEST IMPRESSION: No acute cardiopulmonary process.  Patient Active Problem List   Diagnosis Date Noted  . Sepsis ([place]) 06/23/2018  . Fall 09/10/2017  . PAD (peripheral artery disease) ([place]) 06/22/2017  . Chronic venous insufficiency 06/22/2017  . CKD (chronic kidney disease) stage 3, GFR 30-59 ml/min (HCC) 09/04/2016  . B12 deficiency 05/06/2016  . Hyperlipidemia LDL goal <100 05/05/2016  . Alzheimer's dementia without behavioral disturbance ([place]) 05/04/2016  . Medication monitoring encounter 05/04/2016  . Barrett's esophagus 11/09/2015  . Paroxysmal atrial fibrillation ([place]) 08/07/2013  .  Osteopenia 08/08/2012  . Hypertension 06/03/2012  . Gout 06/03/2012    Past Medical History:  Diagnosis Date  . Alzheimer's dementia with behavioral disturbance ([place]) 05/04/2016  . Alzheimer's dementia without behavioral disturbance ([place]) 05/04/2016  . B12 deficiency 05/06/2016  . Gout   . Hemorrhoids   . Hyperlipidemia LDL goal <100 05/05/2016  . Hypertension   . Memory loss   . PAF (paroxysmal atrial fibrillation) ([place])    a. on Xarelto; b. 48-hr Holter 2014: NSR with PAF/flutter which happened at least 2.26% of the time w/ associated tachycardia. Patient was asymptomatic; c. CHADS2VASc => 4 (HTN, age x 2, female)  . Paroxysmal atrial fibrillation ([place]) 08/07/2013  . Senile osteoporosis   . Systolic dysfunction    a. echo 08/2013: EF 50-55%, mild MR, mildly dilated LA    Past Surgical History:  Procedure Laterality Date  . TUBAL LIGATION      Social History   Tobacco Use  . Smoking status: Former Smoker    Packs/day: 0.25    Years: 1.00    Pack years: 0.25    Types: Cigarettes  . Smokeless tobacco: Never Used  Substance Use Topics  . Alcohol use: No    Alcohol/week: 0.0 standard drinks     Current Outpatient Medications:  .  acetaminophen (TYLENOL) 500 MG tablet, Take 500 mg by mouth every 6 (six) hours as needed., Disp: , Rfl:  .  allopurinol (ZYLOPRIM) 100 MG tablet, Take 1 tablet (100 mg total) by mouth daily., Disp: 30 tablet, Rfl: 5 .  alum & mag hydroxide-simeth (MI-ACID) 200-200-20 MG/5ML suspension, Take 30 mLs by mouth  every 6 (six) hours as needed for indigestion or heartburn., Disp: , Rfl:  .  atorvastatin (LIPITOR) 20 MG tablet, TAKE 1 TABLET BY MOUTH AT BEDTIME (Patient taking differently: Take 20 mg by mouth daily. ), Disp: 30 tablet, Rfl: 5 .  busPIRone (BUSPAR) 5 MG tablet, TAKE 1 TABLET BY MOUTH 2 TIMES DAILY FORDEPRESSION (Patient taking differently: Take 5 mg by mouth 2 (two) times daily. ), Disp: 60 tablet, Rfl: 11 .  cholecalciferol (VITAMIN D) 1000  units tablet, TAKE 1 TABLET BY MOUTH ONCE DAILY (Patient taking differently: Take 1,000 Units by mouth daily. ), Disp: 30 tablet, Rfl: 5 .  Cyanocobalamin (VITAMIN B-12) 500 MCG SUBL, One by mouth daily (Patient taking differently: Place 500 mcg under the tongue daily. One by mouth daily), Disp: 90 tablet, Rfl: 3 .  donepezil (ARICEPT) 10 MG tablet, TAKE 1 TABLET BY MOUTH BEDTIME (Patient taking differently: Take 10 mg by mouth at bedtime. TAKE 1 TABLET BY MOUTH BEDTIME), Disp: 30 tablet, Rfl: 11 .  guaifenesin (ROBITUSSIN) 100 MG/5ML syrup, Take 200 mg by mouth every 6 (six) hours as needed for cough. , Disp: , Rfl:  .  loperamide (IMODIUM) 2 MG capsule, Take 2 mg by mouth as needed for diarrhea or loose stools., Disp: , Rfl:  .  LORazepam (ATIVAN) 0.5 MG tablet, Take 0.5 tablets (0.25 mg total) by mouth 2 (two) times daily as needed for anxiety. (Patient taking differently: Take 0.25 mg by mouth 2 (two) times daily. ), Disp: 30 tablet, Rfl: 2 .  magnesium hydroxide (MILK OF MAGNESIA) 400 MG/5ML suspension, Take 30 mLs by mouth daily as needed for mild constipation., Disp: , Rfl:  .  memantine (NAMENDA) 10 MG tablet, Take 1 tablet (10 mg total) by mouth 2 (two) times daily., Disp: 60 tablet, Rfl: 5 .  metoprolol tartrate (LOPRESSOR) 25 MG tablet, Take 1 tablet (25 mg total) by mouth 2 (two) times daily., Disp: 60 tablet, Rfl: 3 .  neomycin-bacitracin-polymyxin (NEOSPORIN) [phone] ointment, Apply topically 4 (four) times daily as needed. , Disp: , Rfl:  .  pantoprazole (PROTONIX) 40 MG tablet, Take 1 tablet (40 mg total) by mouth daily., Disp: 30 tablet, Rfl: 5 .  Rivaroxaban (XARELTO) 15 MG TABS tablet, TAKE 1 TABLET BY MOUTH DAILY WITH SUPPER (Patient taking differently: Take 15 mg by mouth daily with supper. TAKE 1 TABLET BY MOUTH DAILY WITH SUPPER), Disp: 30 tablet, Rfl: 5 .  sulfamethoxazole-trimethoprim (BACTRIM DS,SEPTRA DS) 800-160 MG tablet, Take 1 tablet by mouth 2 (two) times daily for 7  days., Disp: 14 tablet, Rfl: 0 .  fluconazole (DIFLUCAN) 150 MG tablet, Take 1 tablet (150 mg total) by mouth once for 1 dose., Disp: 1 tablet, Rfl: 0  Allergies  Allergen Reactions  . Darvon [Propoxyphene Hcl]     As a child    ROS   No other specific complaints in a complete review of systems (except as listed in HPI above).  Objective  Vitals:   11/09/18 1044  BP: 100/70  Pulse: 64  Resp: 16  Temp: 97.6 F (36.4 C)  TempSrc: Oral  SpO2: 99%  Weight: 141 lb 14.4 oz (64.4 kg)  Height: 5\' 4"  (1.626 m)    Body mass index is 24.36 kg/m.  Nursing Note and Vital Signs reviewed.  Physical Exam  Constitutional: Patient appears well-developed and well-nourished.  No distress.  HEENT: head atraumatic, normocephalic, conjunctiva clear, neck supple, throat within normal limits; mucous membranes moist Cardiovascular: Normal rate, regular rhythm and  normal heart sounds.   No BLE edema. Pulmonary/Chest: Effort normal and breath sounds normal. No respiratory distress. Abdominal: Soft. Normal bowel sounds There is no tenderness. Neurological/Psychiatric: Pleasantly disoriented, unable to follow simple commands, able to move all extremities with good strength    No results found for this or any previous visit (from the past 48 hour(s)).  Assessment & Plan  1. Acute cystitis with hematuria Continue antibiotic; had Dr. lada see patient- states is at her baseline. Patient GFR decrease- likely due to dehydration and UTI but received IVF in ER- staff to push fluids and monitor for signs of dehydration- due to patients limited cognitive function do not think it is necessary to put her through lab work today.   2. Antibiotic-induced yeast infection CMA noted frequent vaginal itching; given rx and instructions for staff to administer if needed for yeast infection  - fluconazole (DIFLUCAN) 150 MG tablet; Take 1 tablet (150 mg total) by mouth once for 1 dose.  Dispense: 1 tablet; Refill:  0

## 2018-11-10 LAB — URINE CULTURE: Culture: >=100000 — AB

## 2018-11-14 NOTE — Assessment & Plan Note (Signed)
Continue medicine; controlled

## 2018-11-14 NOTE — Assessment & Plan Note (Signed)
Doing well on current medicine

## 2018-11-14 NOTE — Assessment & Plan Note (Signed)
Continue PPI indefinitely. 

## 2018-11-14 NOTE — Assessment & Plan Note (Signed)
Some anxiety, but not combative; FL-2 form completed; requiring 24/7 memory care; gradual declining appreciated; husband does not wish her to see neurologist

## 2018-11-29 ENCOUNTER — Other Ambulatory Visit: Payer: Self-pay | Admitting: Family Medicine

## 2018-12-09 ENCOUNTER — Encounter: Payer: Self-pay | Admitting: Emergency Medicine

## 2018-12-09 ENCOUNTER — Other Ambulatory Visit: Payer: Self-pay

## 2018-12-09 ENCOUNTER — Emergency Department
Admission: EM | Admit: 2018-12-09 | Discharge: 2018-12-09 | Disposition: A | Discharge status: Home / Self Care | Payer: Medicare Other | Attending: Emergency Medicine | Admitting: Emergency Medicine

## 2018-12-09 ENCOUNTER — Emergency Department: Payer: Medicare Other

## 2018-12-09 DIAGNOSIS — I129 Hypertensive chronic kidney disease with stage 1 through stage 4 chronic kidney disease, or unspecified chronic kidney disease: Secondary | ICD-10-CM | POA: Diagnosis not present

## 2018-12-09 DIAGNOSIS — Z79899 Other long term (current) drug therapy: Secondary | ICD-10-CM | POA: Insufficient documentation

## 2018-12-09 DIAGNOSIS — Z7901 Long term (current) use of anticoagulants: Secondary | ICD-10-CM | POA: Diagnosis not present

## 2018-12-09 DIAGNOSIS — N183 Chronic kidney disease, stage 3 (moderate): Secondary | ICD-10-CM | POA: Diagnosis not present

## 2018-12-09 DIAGNOSIS — F0280 Dementia in other diseases classified elsewhere without behavioral disturbance: Secondary | ICD-10-CM | POA: Insufficient documentation

## 2018-12-09 DIAGNOSIS — G308 Other Alzheimer's disease: Secondary | ICD-10-CM | POA: Insufficient documentation

## 2018-12-09 DIAGNOSIS — R0902 Hypoxemia: Secondary | ICD-10-CM | POA: Diagnosis not present

## 2018-12-09 DIAGNOSIS — Z87891 Personal history of nicotine dependence: Secondary | ICD-10-CM | POA: Insufficient documentation

## 2018-12-09 DIAGNOSIS — R410 Disorientation, unspecified: Secondary | ICD-10-CM | POA: Diagnosis not present

## 2018-12-09 DIAGNOSIS — R531 Weakness: Secondary | ICD-10-CM | POA: Diagnosis not present

## 2018-12-09 DIAGNOSIS — R4182 Altered mental status, unspecified: Secondary | ICD-10-CM | POA: Diagnosis not present

## 2018-12-09 LAB — BASIC METABOLIC PANEL
ANION GAP: 7 (ref 5–15)
BUN: 18 mg/dL (ref 8–23)
CALCIUM: 8.5 mg/dL — AB (ref 8.9–10.3)
CHLORIDE: 111 mmol/L (ref 98–111)
CO2: 23 mmol/L (ref 22–32)
CREATININE: 1.1 mg/dL — AB (ref 0.44–1.00)
GFR calc Af Amer: 55 mL/min — ABNORMAL LOW (ref >60)
GFR calc non Af Amer: 48 mL/min — ABNORMAL LOW (ref >60)
Glucose, Bld: 101 mg/dL — ABNORMAL HIGH (ref 70–99)
Potassium: 3.6 mmol/L (ref 3.5–5.1)
SODIUM: 141 mmol/L (ref 135–145)

## 2018-12-09 LAB — CBC WITH DIFFERENTIAL/PLATELET
Abs Immature Granulocytes: 0.04 10*3/uL (ref 0.00–0.07)
Basophils Absolute: 0 10*3/uL (ref 0.0–0.1)
Basophils Relative: 0 %
Eosinophils Absolute: 0.1 10*3/uL (ref 0.0–0.5)
Eosinophils Relative: 1 %
HEMATOCRIT: 40.2 % (ref 36.0–46.0)
HEMOGLOBIN: 13 g/dL (ref 12.0–15.0)
IMMATURE GRANULOCYTES: 0 %
LYMPHS ABS: 0.8 10*3/uL (ref 0.7–4.0)
LYMPHS PCT: 8 %
MCH: 29.7 pg (ref 26.0–34.0)
MCHC: 32.3 g/dL (ref 30.0–36.0)
MCV: 91.8 fL (ref 80.0–100.0)
Monocytes Absolute: 0.5 10*3/uL (ref 0.1–1.0)
Monocytes Relative: 5 %
NEUTROS ABS: 8.2 10*3/uL — AB (ref 1.7–7.7)
NEUTROS PCT: 86 %
NRBC: 0 % (ref 0.0–0.2)
Platelets: 210 10*3/uL (ref 150–400)
RBC: 4.38 MIL/uL (ref 3.87–5.11)
RDW: 14 % (ref 11.5–15.5)
WBC: 9.6 10*3/uL (ref 4.0–10.5)

## 2018-12-09 LAB — TROPONIN I: Troponin I: <0.03 ng/mL (ref <0.03)

## 2018-12-09 NOTE — Discharge Instructions (Signed)
Return to the emergency room for any new or worrisome symptoms. °

## 2018-12-09 NOTE — ED Notes (Signed)
PT ready for d/c, husband is aware is unable to come back to get here.  [facility] states they have no transportation and the pt will need to return via EMS.  Pt dressed, IV removed and monitors d/c'd at this time.  Will monitor pt until EMS arrives.

## 2018-12-09 NOTE — ED Notes (Signed)
PT continues to await ambulance arrival for transport back to [facility].  NAD noted, pt resting quietly in ER stretcher, will continue to monitor.

## 2018-12-09 NOTE — ED Triage Notes (Signed)
Pt to ER via EMS from [facility].  Pt was found by staff in her room after breakfast with her head down on her chest.  Per staff pt was diaphoretic and O2 sats were 70s.  EMS reports O2 sats 92% and pt is her normal.

## 2018-12-09 NOTE — ED Provider Notes (Signed)
[facility] Emergency Department Provider Note  ____________________________________________   I have reviewed the triage vital signs and the nursing notes. Where available I have reviewed prior notes and, if possible and indicated, outside hospital notes.    HISTORY  Chief Complaint Altered Mental Status    HPI LAURENA VALKO is a 77 y.o. female  Who suffers from significant dementia, unfortunately, she presents today because, at the nursing home, she seemed less responsive than normal for a few minutes and she had a transitory sat that they thought was in the 70s. Per ems, however, when they arrived she was awake and her satswere normal, she seemed quite well to them on the way in, today she cannot give any history, her husband briefly came to the department states she seems normal would like her to be discharged if possible..   Level 5 chart caveat; no further history available due to patient status.  Past Medical History:  Diagnosis Date  . Alzheimer's dementia with behavioral disturbance ([place]) 05/04/2016  . Alzheimer's dementia without behavioral disturbance ([place]) 05/04/2016  . B12 deficiency 05/06/2016  . Gout   . Hemorrhoids   . Hyperlipidemia LDL goal <100 05/05/2016  . Hypertension   . Memory loss   . PAF (paroxysmal atrial fibrillation) ([place])    a. on Xarelto; b. 48-hr Holter 2014: NSR with PAF/flutter which happened at least 2.26% of the time w/ associated tachycardia. Patient was asymptomatic; c. CHADS2VASc => 4 (HTN, age x 2, female)  . Paroxysmal atrial fibrillation ([place]) 08/07/2013  . Senile osteoporosis   . Systolic dysfunction    a. echo 08/2013: EF 50-55%, mild MR, mildly dilated LA    Patient Active Problem List   Diagnosis Date Noted  . Sepsis ([place]) 06/23/2018  . Fall 09/10/2017  . PAD (peripheral artery disease) ([place]) 06/22/2017  . Chronic venous insufficiency 06/22/2017  . CKD (chronic kidney disease) stage 3, GFR 30-59 ml/min (HCC)  09/04/2016  . B12 deficiency 05/06/2016  . Hyperlipidemia LDL goal <100 05/05/2016  . Alzheimer's dementia without behavioral disturbance ([place]) 05/04/2016  . Medication monitoring encounter 05/04/2016  . Barrett's esophagus 11/09/2015  . Paroxysmal atrial fibrillation ([place]) 08/07/2013  . Osteopenia 08/08/2012  . Hypertension 06/03/2012  . Gout 06/03/2012    Past Surgical History:  Procedure Laterality Date  . TUBAL LIGATION      Prior to Admission medications   Medication Sig Start Date End Date Taking? Authorizing Provider  acetaminophen (TYLENOL) 500 MG tablet Take 500 mg by mouth every 6 (six) hours as needed.   Yes [provider]  allopurinol (ZYLOPRIM) 100 MG tablet Take 1 tablet (100 mg total) by mouth daily. 10/27/18  Yes Lada, Satira Anis, MD  alum & mag hydroxide-simeth (MI-ACID) 200-200-20 MG/5ML suspension Take 30 mLs by mouth every 6 (six) hours as needed for indigestion or heartburn.   Yes [provider]  atorvastatin (LIPITOR) 20 MG tablet TAKE 1 TABLET BY MOUTH AT BEDTIME Patient taking differently: Take 20 mg by mouth daily.  06/07/18  Yes Lada, Satira Anis, MD  busPIRone (BUSPAR) 5 MG tablet TAKE 1 TABLET BY MOUTH 2 TIMES DAILY FORDEPRESSION Patient taking differently: Take 5 mg by mouth 2 (two) times daily.  05/30/18  Yes Lada, Satira Anis, MD  cholecalciferol (VITAMIN D) 1000 units tablet TAKE 1 TABLET BY MOUTH ONCE DAILY Patient taking differently: Take 1,000 Units by mouth daily.  04/13/18  Yes Lada, Satira Anis, MD  Cyanocobalamin (VITAMIN B-12) 500 MCG SUBL One by  mouth daily Patient taking differently: Place 500 mcg under the tongue daily. One by mouth daily 01/27/18  Yes Lada, Satira Anis, MD  donepezil (ARICEPT) 10 MG tablet TAKE 1 TABLET BY MOUTH BEDTIME Patient taking differently: Take 10 mg by mouth at bedtime. TAKE 1 TABLET BY MOUTH BEDTIME 10/27/18  Yes Lada, Satira Anis, MD  guaifenesin (ROBITUSSIN) 100 MG/5ML syrup Take 200 mg by mouth every 6 (six)  hours as needed for cough.    Yes [provider]  loperamide (IMODIUM) 2 MG capsule Take 2 mg by mouth as needed for diarrhea or loose stools.   Yes [provider]  LORazepam (ATIVAN) 0.5 MG tablet Take 0.5 tablets (0.25 mg total) by mouth 2 (two) times daily. 11/09/18  Yes Lada, Satira Anis, MD  magnesium hydroxide (MILK OF MAGNESIA) 400 MG/5ML suspension Take 30 mLs by mouth daily as needed for mild constipation.   Yes [provider]  memantine (NAMENDA) 10 MG tablet Take 1 tablet (10 mg total) by mouth 2 (two) times daily. 10/27/18  Yes Lada, Satira Anis, MD  metoprolol tartrate (LOPRESSOR) 25 MG tablet Take 1 tablet (25 mg total) by mouth 2 (two) times daily. 11/04/18  Yes Wellington [place], MD  neomycin-bacitracin-polymyxin (NEOSPORIN) [phone] ointment Apply topically 4 (four) times daily as needed.    Yes [provider]  pantoprazole (PROTONIX) 40 MG tablet Take 1 tablet (40 mg total) by mouth daily. 10/27/18  Yes Lada, Satira Anis, MD  Rivaroxaban (XARELTO) 15 MG TABS tablet TAKE 1 TABLET BY MOUTH DAILY WITH SUPPER Patient taking differently: Take 15 mg by mouth daily with supper. TAKE 1 TABLET BY MOUTH DAILY WITH SUPPER 10/06/18  Yes Lada, Satira Anis, MD    Allergies Darvon [propoxyphene hcl]  Family History  Problem Relation Age of Onset  . Heart disease Father   . Gout Brother     Social History Social History   Tobacco Use  . Smoking status: Former Smoker    Packs/day: 0.25    Years: 1.00    Pack years: 0.25    Types: Cigarettes  . Smokeless tobacco: Never Used  Substance Use Topics  . Alcohol use: No    Alcohol/week: 0.0 standard drinks  . Drug use: No    Review of Systems Constitutional: No fever/chills Eyes: No visual changes. ENT: No sore throat. No stiff neck no neck pain Cardiovascular: Denies chest pain. Respiratory: Denies shortness of breath. Gastrointestinal:   no vomiting.  No diarrhea.  No  constipation. Genitourinary: Negative for dysuria. Musculoskeletal: Negative lower extremity swelling Skin: Negative for rash. Neurological: Negative for severe headaches, focal weakness or numbness.   ____________________________________________   PHYSICAL EXAM:  VITAL SIGNS: ED Triage Vitals  Enc Vitals Group     BP 12/09/18 1125 99/64     Pulse Rate 12/09/18 1125 77     Resp 12/09/18 1125 18     Temp 12/09/18 1125 (!) 97.2 F (36.2 C)     Temp Source 12/09/18 1125 Axillary     SpO2 12/09/18 1121 95 %     Weight 12/09/18 1131 145 lb (65.8 kg)     Height 12/09/18 1131 5\' 2"  (1.575 m)     Head Circumference --      Peak Flow --      Pain Score --      Pain Loc --      Pain Edu? --      Excl. in [place]? --     Constitutional: Alert  pleasantly demented in no acute distress Eyes: Conjunctivae are normal Head: Atraumatic HEENT: No congestion/rhinnorhea. Mucous membranes are moist.  Oropharynx non-erythematous Neck:   Nontender with no meningismus, no masses, no stridor Cardiovascular: Normal rate, regular rhythm. Grossly normal heart sounds.  Good peripheral circulation. Respiratory: Normal respiratory effort.  No retractions. Lungs CTAB. Abdominal: Soft and nontender. No distention. No guarding no rebound Back:  There is no focal tenderness or step off.  there is no midline tenderness there are no lesions noted. there is no CVA tenderness Musculoskeletal: No lower extremity tenderness, no upper extremity tenderness. No joint effusions, no DVT signs strong distal pulses no edema Neurologic:  Normal speech and language. No gross focal neurologic deficits are appreciated.  Skin:  Skin is warm, dry and intact. No rash noted. Psychiatric: Mood and affect are normal. Speech and behavior are normal.  ____________________________________________   LABS (all labs ordered are listed, but only abnormal results are displayed)  Labs Reviewed  CBC WITH DIFFERENTIAL/PLATELET - Abnormal;  Notable for the following components:      Result Value   Neutro Abs 8.2 (*)    All other components within normal limits  BASIC METABOLIC PANEL - Abnormal; Notable for the following components:   Glucose, Bld 101 (*)    Creatinine, Ser 1.10 (*)    Calcium 8.5 (*)    GFR calc non Af Amer 48 (*)    GFR calc Af Amer 55 (*)    All other components within normal limits  TROPONIN I    Pertinent labs  results that were available during my care of the patient were reviewed by me and considered in my medical decision making (see chart for details). ____________________________________________  EKG  I personally interpreted any EKGs ordered by me or triage Atrial fibrillation possibly flutter noted, no acute ST elevation or depression, normal axis.  Rate 67 ____________________________________________  RADIOLOGY  Pertinent labs & imaging results that were available during my care of the patient were reviewed by me and considered in my medical decision making (see chart for details). If possible, patient and/or family made aware of any abnormal findings.  Dg Chest 2 View  Result Date: 12/09/2018 CLINICAL DATA:  Hypoxia, found after breakfast with her head down on her chest, diaphoretic, low oxygen saturation in the 70s EXAM: CHEST - 2 VIEW COMPARISON:  11/07/2018 FINDINGS: Minimal enlargement of cardiac silhouette. Rotated to the RIGHT. Mediastinal contours and pulmonary vascularity normal. Lungs clear. No infiltrate, pleural effusion or pneumothorax. Bones demineralized. IMPRESSION: Minimal enlargement of cardiac silhouette. No acute abnormalities. Electronically Signed   By: Lavonia Dana M.D.   On: 12/09/2018 12:15   ____________________________________________    PROCEDURES  Procedure(s) performed: None  Procedures  Critical Care performed: None  ____________________________________________   INITIAL IMPRESSION / ASSESSMENT AND PLAN / ED COURSE  Pertinent labs & imaging  results that were available during my care of the patient were reviewed by me and considered in my medical decision making (see chart for details).  Patient here with no chief complaint with no evidence of acute pathology and work-up is reassuring.  We will discharge her after cardiac work-up is negative return precautions follow-up given understood    ____________________________________________   FINAL CLINICAL IMPRESSION(S) / ED DIAGNOSES  Final diagnoses:  None      This chart was dictated using voice recognition software.  Despite best efforts to proofread,  errors can occur which can change meaning.      Schuyler Amor, MD  12/09/18 1520  

## 2018-12-09 NOTE — ED Notes (Signed)
Up date per EMS, return transfer will be delayed due to heavy call volume at this time.  Pt continues to rest quietly on ER stretcher, will continue to monitor for safety.

## 2019-01-08 ENCOUNTER — Emergency Department: Payer: Medicare Other

## 2019-01-08 ENCOUNTER — Emergency Department
Admission: EM | Admit: 2019-01-08 | Discharge: 2019-01-08 | Disposition: A | Discharge status: Skilled Nursing Facility | Payer: Medicare Other | Attending: Emergency Medicine | Admitting: Emergency Medicine

## 2019-01-08 ENCOUNTER — Other Ambulatory Visit: Payer: Self-pay

## 2019-01-08 DIAGNOSIS — Z79899 Other long term (current) drug therapy: Secondary | ICD-10-CM | POA: Insufficient documentation

## 2019-01-08 DIAGNOSIS — Z23 Encounter for immunization: Secondary | ICD-10-CM | POA: Insufficient documentation

## 2019-01-08 DIAGNOSIS — R55 Syncope and collapse: Secondary | ICD-10-CM

## 2019-01-08 DIAGNOSIS — S022XXA Fracture of nasal bones, initial encounter for closed fracture: Secondary | ICD-10-CM | POA: Diagnosis not present

## 2019-01-08 DIAGNOSIS — S0121XA Laceration without foreign body of nose, initial encounter: Secondary | ICD-10-CM | POA: Diagnosis not present

## 2019-01-08 DIAGNOSIS — I129 Hypertensive chronic kidney disease with stage 1 through stage 4 chronic kidney disease, or unspecified chronic kidney disease: Secondary | ICD-10-CM | POA: Insufficient documentation

## 2019-01-08 DIAGNOSIS — S199XXA Unspecified injury of neck, initial encounter: Secondary | ICD-10-CM | POA: Diagnosis not present

## 2019-01-08 DIAGNOSIS — Z87891 Personal history of nicotine dependence: Secondary | ICD-10-CM | POA: Diagnosis not present

## 2019-01-08 DIAGNOSIS — Z743 Need for continuous supervision: Secondary | ICD-10-CM | POA: Diagnosis not present

## 2019-01-08 DIAGNOSIS — S0990XA Unspecified injury of head, initial encounter: Secondary | ICD-10-CM | POA: Diagnosis not present

## 2019-01-08 DIAGNOSIS — G309 Alzheimer's disease, unspecified: Secondary | ICD-10-CM | POA: Diagnosis not present

## 2019-01-08 DIAGNOSIS — W07XXXA Fall from chair, initial encounter: Secondary | ICD-10-CM | POA: Insufficient documentation

## 2019-01-08 DIAGNOSIS — Y999 Unspecified external cause status: Secondary | ICD-10-CM | POA: Diagnosis not present

## 2019-01-08 DIAGNOSIS — R404 Transient alteration of awareness: Secondary | ICD-10-CM | POA: Diagnosis not present

## 2019-01-08 DIAGNOSIS — S299XXA Unspecified injury of thorax, initial encounter: Secondary | ICD-10-CM | POA: Diagnosis not present

## 2019-01-08 DIAGNOSIS — W19XXXA Unspecified fall, initial encounter: Secondary | ICD-10-CM | POA: Diagnosis not present

## 2019-01-08 DIAGNOSIS — Y929 Unspecified place or not applicable: Secondary | ICD-10-CM | POA: Insufficient documentation

## 2019-01-08 DIAGNOSIS — R279 Unspecified lack of coordination: Secondary | ICD-10-CM | POA: Diagnosis not present

## 2019-01-08 DIAGNOSIS — F0390 Unspecified dementia without behavioral disturbance: Secondary | ICD-10-CM | POA: Insufficient documentation

## 2019-01-08 DIAGNOSIS — Y939 Activity, unspecified: Secondary | ICD-10-CM | POA: Insufficient documentation

## 2019-01-08 DIAGNOSIS — S3993XA Unspecified injury of pelvis, initial encounter: Secondary | ICD-10-CM | POA: Diagnosis not present

## 2019-01-08 DIAGNOSIS — I959 Hypotension, unspecified: Secondary | ICD-10-CM | POA: Diagnosis not present

## 2019-01-08 DIAGNOSIS — R079 Chest pain, unspecified: Secondary | ICD-10-CM | POA: Diagnosis not present

## 2019-01-08 DIAGNOSIS — N183 Chronic kidney disease, stage 3 (moderate): Secondary | ICD-10-CM | POA: Insufficient documentation

## 2019-01-08 LAB — URINALYSIS, COMPLETE (UACMP) WITH MICROSCOPIC
Bacteria, UA: NONE SEEN
Bilirubin Urine: NEGATIVE
Glucose, UA: NEGATIVE mg/dL
Hgb urine dipstick: NEGATIVE
Ketones, ur: NEGATIVE mg/dL
Leukocytes, UA: NEGATIVE
Nitrite: NEGATIVE
Protein, ur: NEGATIVE mg/dL
Specific Gravity, Urine: 1.02 (ref 1.005–1.030)
pH: 5 (ref 5.0–8.0)

## 2019-01-08 LAB — CBC WITH DIFFERENTIAL/PLATELET
Abs Immature Granulocytes: 0.05 10*3/uL (ref 0.00–0.07)
Basophils Absolute: 0 10*3/uL (ref 0.0–0.1)
Basophils Relative: 0 %
EOS ABS: 0.1 10*3/uL (ref 0.0–0.5)
EOS PCT: 1 %
HEMATOCRIT: 42.1 % (ref 36.0–46.0)
Hemoglobin: 13.5 g/dL (ref 12.0–15.0)
Immature Granulocytes: 1 %
LYMPHS ABS: 1.1 10*3/uL (ref 0.7–4.0)
LYMPHS PCT: 11 %
MCH: 29.5 pg (ref 26.0–34.0)
MCHC: 32.1 g/dL (ref 30.0–36.0)
MCV: 92.1 fL (ref 80.0–100.0)
MONOS PCT: 6 %
Monocytes Absolute: 0.7 10*3/uL (ref 0.1–1.0)
Neutro Abs: 8.5 10*3/uL — ABNORMAL HIGH (ref 1.7–7.7)
Neutrophils Relative %: 81 %
Platelets: 284 10*3/uL (ref 150–400)
RBC: 4.57 MIL/uL (ref 3.87–5.11)
RDW: 14.3 % (ref 11.5–15.5)
WBC: 10.5 10*3/uL (ref 4.0–10.5)
nRBC: 0 % (ref 0.0–0.2)

## 2019-01-08 LAB — COMPREHENSIVE METABOLIC PANEL
ALK PHOS: 104 U/L (ref 38–126)
ALT: 12 U/L (ref 0–44)
ANION GAP: 5 (ref 5–15)
AST: 20 U/L (ref 15–41)
Albumin: 3.5 g/dL (ref 3.5–5.0)
BILIRUBIN TOTAL: 0.9 mg/dL (ref 0.3–1.2)
BUN: 17 mg/dL (ref 8–23)
CALCIUM: 8.6 mg/dL — AB (ref 8.9–10.3)
CO2: 26 mmol/L (ref 22–32)
Chloride: 108 mmol/L (ref 98–111)
Creatinine, Ser: 1.17 mg/dL — ABNORMAL HIGH (ref 0.44–1.00)
GFR calc Af Amer: 51 mL/min — ABNORMAL LOW (ref >60)
GFR, EST NON AFRICAN AMERICAN: 44 mL/min — AB (ref >60)
GLUCOSE: 117 mg/dL — AB (ref 70–99)
POTASSIUM: 3.7 mmol/L (ref 3.5–5.1)
Sodium: 139 mmol/L (ref 135–145)
TOTAL PROTEIN: 6.9 g/dL (ref 6.5–8.1)

## 2019-01-08 LAB — TROPONIN I: Troponin I: <0.03 ng/mL (ref <0.03)

## 2019-01-08 MED ORDER — TETANUS-DIPHTH-ACELL PERTUSSIS 5-2.5-18.5 LF-MCG/0.5 IM SUSP
0.5000 mL | Freq: Once | INTRAMUSCULAR | Status: AC
  Administered 2019-01-08: 0.5 mL via INTRAMUSCULAR
  Filled 2019-01-08: qty 0.5

## 2019-01-08 MED ORDER — AMOXICILLIN-POT CLAVULANATE 875-125 MG PO TABS: 1.0000 | ORAL_TABLET | Freq: Two times a day (BID) | ORAL | 0 refills | Status: DC

## 2019-01-08 MED ORDER — SODIUM CHLORIDE 0.9 % IV BOLUS
500.0000 mL | Freq: Once | INTRAVENOUS | Status: AC
  Administered 2019-01-08: 500 mL via INTRAVENOUS

## 2019-01-08 MED ORDER — AMOXICILLIN-POT CLAVULANATE 875-125 MG PO TABS
1.0000 | ORAL_TABLET | Freq: Once | ORAL | Status: AC
  Administered 2019-01-08: 1 via ORAL
  Filled 2019-01-08: qty 1

## 2019-01-08 NOTE — ED Provider Notes (Addendum)
[facility] Emergency Department Provider Note   ____________________________________________   None    (approximate)  I have reviewed the triage vital signs and the nursing notes.   HISTORY  Chief Complaint Fall and Laceration   HPI Jennifer Klein is a 78 y.o. female with a history of atrial fibrillation on Xarelto who was presented emergency department after fall.  EMS reports that the patient "passed out" while sitting, watching television.  EMS reports the fall was unwitnessed and that when they arrived the patient had a systolic blood pressure with a palp of 60.  Since then the patient blood pressure has increased to over 100.  Patient is at her baseline mental status with dementia and is unable to give a history.  The EMS crew says the patient "sings" but will not answer questions.  Patient without any complaints at this time.  Unclear of last tetanus shot.   Past Medical History:  Diagnosis Date  . Alzheimer's dementia with behavioral disturbance ([place]) 05/04/2016  . Alzheimer's dementia without behavioral disturbance ([place]) 05/04/2016  . B12 deficiency 05/06/2016  . Gout   . Hemorrhoids   . Hyperlipidemia LDL goal <100 05/05/2016  . Hypertension   . Memory loss   . PAF (paroxysmal atrial fibrillation) ([place])    a. on Xarelto; b. 48-hr Holter 2014: NSR with PAF/flutter which happened at least 2.26% of the time w/ associated tachycardia. Patient was asymptomatic; c. CHADS2VASc => 4 (HTN, age x 2, female)  . Paroxysmal atrial fibrillation ([place]) 08/07/2013  . Senile osteoporosis   . Systolic dysfunction    a. echo 08/2013: EF 50-55%, mild MR, mildly dilated LA    Patient Active Problem List   Diagnosis Date Noted  . Sepsis ([place]) 06/23/2018  . Fall 09/10/2017  . PAD (peripheral artery disease) ([place]) 06/22/2017  . Chronic venous insufficiency 06/22/2017  . CKD (chronic kidney disease) stage 3, GFR 30-59 ml/min (HCC) 09/04/2016  . B12 deficiency  05/06/2016  . Hyperlipidemia LDL goal <100 05/05/2016  . Alzheimer's dementia without behavioral disturbance ([place]) 05/04/2016  . Medication monitoring encounter 05/04/2016  . Barrett's esophagus 11/09/2015  . Paroxysmal atrial fibrillation ([place]) 08/07/2013  . Osteopenia 08/08/2012  . Hypertension 06/03/2012  . Gout 06/03/2012    Past Surgical History:  Procedure Laterality Date  . TUBAL LIGATION      Prior to Admission medications   Medication Sig Start Date End Date Taking? Authorizing Provider  acetaminophen (TYLENOL) 500 MG tablet Take 500 mg by mouth every 6 (six) hours as needed.   Yes [provider]  allopurinol (ZYLOPRIM) 100 MG tablet Take 1 tablet (100 mg total) by mouth daily. 10/27/18  Yes Lada, Satira Anis, MD  alum & mag hydroxide-simeth (MI-ACID) 200-200-20 MG/5ML suspension Take 30 mLs by mouth every 6 (six) hours as needed for indigestion or heartburn.   Yes [provider]  atorvastatin (LIPITOR) 20 MG tablet TAKE 1 TABLET BY MOUTH AT BEDTIME Patient taking differently: Take 20 mg by mouth daily.  06/07/18  Yes Lada, Satira Anis, MD  busPIRone (BUSPAR) 5 MG tablet TAKE 1 TABLET BY MOUTH 2 TIMES DAILY FORDEPRESSION Patient taking differently: Take 5 mg by mouth 2 (two) times daily.  05/30/18  Yes Lada, Satira Anis, MD  cholecalciferol (VITAMIN D) 1000 units tablet TAKE 1 TABLET BY MOUTH ONCE DAILY Patient taking differently: Take 1,000 Units by mouth daily.  04/13/18  Yes Lada, Satira Anis, MD  donepezil (ARICEPT) 10 MG tablet TAKE 1 TABLET BY  MOUTH BEDTIME Patient taking differently: Take 10 mg by mouth at bedtime. TAKE 1 TABLET BY MOUTH BEDTIME 10/27/18  Yes Lada, Satira Anis, MD  guaifenesin (ROBITUSSIN) 100 MG/5ML syrup Take 200 mg by mouth every 6 (six) hours as needed for cough.    Yes [provider]  loperamide (IMODIUM) 2 MG capsule Take 2 mg by mouth as needed for diarrhea or loose stools.   Yes [provider]  LORazepam (ATIVAN) 0.5 MG  tablet Take 0.5 tablets (0.25 mg total) by mouth 2 (two) times daily. 11/09/18  Yes Lada, Satira Anis, MD  magnesium hydroxide (MILK OF MAGNESIA) 400 MG/5ML suspension Take 30 mLs by mouth daily as needed for mild constipation.   Yes [provider]  memantine (NAMENDA) 10 MG tablet Take 1 tablet (10 mg total) by mouth 2 (two) times daily. 10/27/18  Yes Lada, Satira Anis, MD  metoprolol tartrate (LOPRESSOR) 25 MG tablet Take 1 tablet (25 mg total) by mouth 2 (two) times daily. 11/04/18  Yes Wellington Hampshire, MD  neomycin-bacitracin-polymyxin (NEOSPORIN) [phone] ointment Apply topically 4 (four) times daily as needed.    Yes [provider]  pantoprazole (PROTONIX) 40 MG tablet Take 1 tablet (40 mg total) by mouth daily. 10/27/18  Yes Lada, Satira Anis, MD  Rivaroxaban (XARELTO) 15 MG TABS tablet TAKE 1 TABLET BY MOUTH DAILY WITH SUPPER Patient taking differently: Take 15 mg by mouth daily with supper. TAKE 1 TABLET BY MOUTH DAILY WITH SUPPER 10/06/18  Yes Lada, Satira Anis, MD  vitamin B-12 (CYANOCOBALAMIN) 500 MCG tablet Take 500 mcg by mouth daily.   Yes [provider]    Allergies Darvon [propoxyphene hcl]  Family History  Problem Relation Age of Onset  . Heart disease Father   . Gout Brother     Social History Social History   Tobacco Use  . Smoking status: Former Smoker    Packs/day: 0.25    Years: 1.00    Pack years: 0.25    Types: Cigarettes  . Smokeless tobacco: Never Used  Substance Use Topics  . Alcohol use: No    Alcohol/week: 0.0 standard drinks  . Drug use: No    Review of Systems  Level 5 caveat secondary to dementia.   ____________________________________________   PHYSICAL EXAM:  VITAL SIGNS: ED Triage Vitals  Enc Vitals Group     BP 01/08/19 1017 105/80     Pulse Rate 01/08/19 1017 79     Resp 01/08/19 1017 17     Temp 01/08/19 1017 (!) 95.4 F (35.2 C)     Temp Source 01/08/19 1017 Axillary     SpO2 01/08/19 1017 99 %      Weight 01/08/19 1015 140 lb (63.5 kg)     Height 01/08/19 1015 5\' 3"  (1.6 m)     Head Circumference --      Peak Flow --      Pain Score --      Pain Loc --      Pain Edu? --      Excl. in [place]? --     Constitutional: Alert but oriented x0. Well appearing and in no acute distress. Eyes: Conjunctivae are normal.  Head: Atraumatic. Nose: Swelling diffusely with small laceration over the nasal bridge approximately 0.5 cm that is well approximated without any active bleeding.  No nasal septal hematoma. Mouth/Throat: Mucous membranes are moist.  Neck: No stridor.   Cardiovascular: Normal rate, regular rhythm. Grossly normal heart sounds.  No tenderness  over the chest wall. Respiratory: Normal respiratory effort.  No retractions. Lungs CTAB. Gastrointestinal: Soft and nontender. No distention. No CVA tenderness. Musculoskeletal: No lower extremity tenderness nor edema.  No joint effusions.  No limb shortening.  Good tone to the bilateral lower extremities.  However, patient unable to follow commands to lift to the extremities. Neurologic:   No gross focal neurologic deficits are appreciated. Skin: 2 cm x 6 cm superficial skin tear to the left dorsal forearm.  ____________________________________________   LABS (all labs ordered are listed, but only abnormal results are displayed)  Labs Reviewed  CBC WITH DIFFERENTIAL/PLATELET - Abnormal; Notable for the following components:      Result Value   Neutro Abs 8.5 (*)    All other components within normal limits  COMPREHENSIVE METABOLIC PANEL - Abnormal; Notable for the following components:   Glucose, Bld 117 (*)    Creatinine, Ser 1.17 (*)    Calcium 8.6 (*)    GFR calc non Af Amer 44 (*)    GFR calc Af Amer 51 (*)    All other components within normal limits  URINALYSIS, COMPLETE (UACMP) WITH MICROSCOPIC - Abnormal; Notable for the following components:   Color, Urine YELLOW (*)    APPearance CLEAR (*)    All other components within  normal limits  TROPONIN I  TROPONIN I   ____________________________________________  EKG  ED ECG REPORT I, Doran Stabler, the attending physician, personally viewed and interpreted this ECG.   Date: 01/08/2019  EKG Time: 1053  Rate: 70  Rhythm: Fibrillation versus flutter  Axis: Normal  Intervals:right bundle branch block  ST&T Change: No ST segment elevation or depression.  No abnormal T wave inversion.  ____________________________________________  RADIOLOGY  Chest x-ray without acute process.  Pelvic x-ray without acute process.  CT head, facial bones as well as cervical spine with comminuted fracture of the nasal bone and displaced fracture of the nasal septum.  No other fracture identified. ____________________________________________   PROCEDURES  Procedure(s) performed:   Procedures  Critical Care performed:   ____________________________________________   INITIAL IMPRESSION / ASSESSMENT AND PLAN / ED COURSE  Pertinent labs & imaging results that were available during my care of the patient were reviewed by me and considered in my medical decision making (see chart for details).  DDX: Syncope, arrhythmia, vasovagal episode, UTI, electrolyte abnormality, kidney failure, nasal fracture, intracranial hemorrhage, skin tear As part of my medical decision making, I reviewed the following data within the electronic MEDICAL RECORD NUMBER Notes from prior ED visits  ----------------------------------------- 2:49 PM on 01/08/2019 -----------------------------------------  Patient with nasal bone fracture as well as septal fracture.  Discussed the case with the patient's husband, Jennifer Klein, who reports that the patient has had multiple syncopal episodes in the past that have been caused by suspected dehydration.  Patient here initially hypotensive and then given fluids and blood pressure has risen dramatically.  Discussed possible further work-up with the patient's  husband and he says that he would prefer that the patient be discharged back to [facility].  We discussed the patient's mental status at this time how she will speak words and occasionally sing but is not able to answer questions.  Husband says that this is her baseline.  Husband understanding the diagnosis well treatment and willing to comply.  Nasal fracture is well approximated.  Will not suture or glue.  No active bleeding. ____________________________________________   FINAL CLINICAL IMPRESSION(S) / ED DIAGNOSES  Nasal bone fracture.  Nasal  septum fracture.  Syncope.  NEW MEDICATIONS STARTED DURING THIS VISIT:  New Prescriptions   No medications on file     Note:  This document was prepared using Dragon voice recognition software and may include unintentional dictation errors.     Orbie Pyo, MD 01/08/19 [place], Randall An, MD 01/08/19 [id number]

## 2019-01-08 NOTE — ED Notes (Signed)
Pt unable to sign for discharge due to dementia/mental status

## 2019-01-08 NOTE — ED Triage Notes (Signed)
Pt arrived via ems for unwitnessed fall causing lac to nose and skin tear to left lower arm - bleeding controlled - pt is nonverbal and only sings

## 2019-01-08 NOTE — ED Notes (Signed)
Pt turned, dried, and repositioned after I&O cath

## 2019-01-08 NOTE — ED Notes (Signed)
Brandy RN attempted I&O x2 with only pus obtained - this nurse attempted I&O with only pus obtained - MD aware

## 2019-01-11 ENCOUNTER — Telehealth: Payer: Self-pay | Admitting: Family Medicine

## 2019-01-11 NOTE — Telephone Encounter (Signed)
Copied from [place] [id number]. Topic: General - Other >> Jan 11, 2019  5:07 PM Keene Breath wrote: Reason for CRM: Brandy with [facility] called to inform the doctor that the patient has a rash on her chest and back that is itching very badly.  She wanted to ask if the doctor could prescribe benadryl or something to control the itching.  Please advise and call back as soon as possible.  CB# [phone]

## 2019-01-12 NOTE — Telephone Encounter (Signed)
Spoke with Theadora Rama at [facility] and she stated patients chest was looking much better and she would call us back if the rash came back.

## 2019-01-12 NOTE — Telephone Encounter (Signed)
She would need to be seen, see if Jennifer Klein has an opening We wouldn't know what we're treating without evaluating her

## 2019-01-15 ENCOUNTER — Emergency Department
Admission: EM | Admit: 2019-01-15 | Discharge: 2019-01-15 | Disposition: A | Discharge status: Home / Self Care | Payer: Medicare Other | Source: Home / Self Care | Attending: Emergency Medicine | Admitting: Emergency Medicine

## 2019-01-15 DIAGNOSIS — Z7901 Long term (current) use of anticoagulants: Secondary | ICD-10-CM

## 2019-01-15 DIAGNOSIS — F0280 Dementia in other diseases classified elsewhere without behavioral disturbance: Secondary | ICD-10-CM | POA: Insufficient documentation

## 2019-01-15 DIAGNOSIS — R21 Rash and other nonspecific skin eruption: Secondary | ICD-10-CM | POA: Insufficient documentation

## 2019-01-15 DIAGNOSIS — G308 Other Alzheimer's disease: Secondary | ICD-10-CM

## 2019-01-15 DIAGNOSIS — N183 Chronic kidney disease, stage 3 (moderate): Secondary | ICD-10-CM

## 2019-01-15 DIAGNOSIS — I4891 Unspecified atrial fibrillation: Secondary | ICD-10-CM | POA: Diagnosis not present

## 2019-01-15 DIAGNOSIS — I129 Hypertensive chronic kidney disease with stage 1 through stage 4 chronic kidney disease, or unspecified chronic kidney disease: Secondary | ICD-10-CM | POA: Insufficient documentation

## 2019-01-15 DIAGNOSIS — R279 Unspecified lack of coordination: Secondary | ICD-10-CM | POA: Diagnosis not present

## 2019-01-15 DIAGNOSIS — Z79899 Other long term (current) drug therapy: Secondary | ICD-10-CM

## 2019-01-15 DIAGNOSIS — T782XXA Anaphylactic shock, unspecified, initial encounter: Secondary | ICD-10-CM | POA: Diagnosis not present

## 2019-01-15 DIAGNOSIS — Z87891 Personal history of nicotine dependence: Secondary | ICD-10-CM | POA: Insufficient documentation

## 2019-01-15 DIAGNOSIS — Z743 Need for continuous supervision: Secondary | ICD-10-CM | POA: Diagnosis not present

## 2019-01-15 DIAGNOSIS — E870 Hyperosmolality and hypernatremia: Secondary | ICD-10-CM | POA: Diagnosis not present

## 2019-01-15 DIAGNOSIS — R5381 Other malaise: Secondary | ICD-10-CM | POA: Diagnosis not present

## 2019-01-15 DIAGNOSIS — R609 Edema, unspecified: Secondary | ICD-10-CM | POA: Diagnosis not present

## 2019-01-15 DIAGNOSIS — Z66 Do not resuscitate: Secondary | ICD-10-CM | POA: Diagnosis not present

## 2019-01-15 DIAGNOSIS — D62 Acute posthemorrhagic anemia: Secondary | ICD-10-CM | POA: Diagnosis not present

## 2019-01-15 DIAGNOSIS — T7840XA Allergy, unspecified, initial encounter: Secondary | ICD-10-CM | POA: Diagnosis not present

## 2019-01-15 DIAGNOSIS — L509 Urticaria, unspecified: Secondary | ICD-10-CM | POA: Diagnosis not present

## 2019-01-15 DIAGNOSIS — R040 Epistaxis: Secondary | ICD-10-CM | POA: Diagnosis not present

## 2019-01-15 DIAGNOSIS — K921 Melena: Secondary | ICD-10-CM | POA: Diagnosis not present

## 2019-01-15 DIAGNOSIS — G309 Alzheimer's disease, unspecified: Secondary | ICD-10-CM | POA: Diagnosis not present

## 2019-01-15 DIAGNOSIS — R52 Pain, unspecified: Secondary | ICD-10-CM | POA: Diagnosis not present

## 2019-01-15 DIAGNOSIS — F0390 Unspecified dementia without behavioral disturbance: Secondary | ICD-10-CM | POA: Diagnosis not present

## 2019-01-15 NOTE — ED Provider Notes (Signed)
[facility] Emergency Department Provider Note ____________________________________________   First MD Initiated Contact with Patient 01/15/19 [phone]     (approximate)  I have reviewed the triage vital signs and the nursing notes.   HISTORY  Chief Complaint Allergic Reaction  Level 5 caveat: History of present illness limited due to dementia  HPI Jennifer Klein is a 78 y.o. female with PMH as noted below who presents from her facility with a rash to her chest and concern for possible allergic reaction.  Per EMS, there are no other acute symptoms.  The patient is unable to voice any specific complaints.  Past Medical History:  Diagnosis Date  . Alzheimer's dementia with behavioral disturbance ([place]) 05/04/2016  . Alzheimer's dementia without behavioral disturbance ([place]) 05/04/2016  . B12 deficiency 05/06/2016  . Gout   . Hemorrhoids   . Hyperlipidemia LDL goal <100 05/05/2016  . Hypertension   . Memory loss   . PAF (paroxysmal atrial fibrillation) ([place])    a. on Xarelto; b. 48-hr Holter 2014: NSR with PAF/flutter which happened at least 2.26% of the time w/ associated tachycardia. Patient was asymptomatic; c. CHADS2VASc => 4 (HTN, age x 2, female)  . Paroxysmal atrial fibrillation ([place]) 08/07/2013  . Senile osteoporosis   . Systolic dysfunction    a. echo 08/2013: EF 50-55%, mild MR, mildly dilated LA    Patient Active Problem List   Diagnosis Date Noted  . Sepsis ([place]) 06/23/2018  . Fall 09/10/2017  . PAD (peripheral artery disease) ([place]) 06/22/2017  . Chronic venous insufficiency 06/22/2017  . CKD (chronic kidney disease) stage 3, GFR 30-59 ml/min (HCC) 09/04/2016  . B12 deficiency 05/06/2016  . Hyperlipidemia LDL goal <100 05/05/2016  . Alzheimer's dementia without behavioral disturbance ([place]) 05/04/2016  . Medication monitoring encounter 05/04/2016  . Barrett's esophagus 11/09/2015  . Paroxysmal atrial fibrillation ([place]) 08/07/2013  . Osteopenia  08/08/2012  . Hypertension 06/03/2012  . Gout 06/03/2012    Past Surgical History:  Procedure Laterality Date  . TUBAL LIGATION      Prior to Admission medications   Medication Sig Start Date End Date Taking? Authorizing Provider  acetaminophen (TYLENOL) 500 MG tablet Take 500 mg by mouth every 6 (six) hours as needed.    [provider]  allopurinol (ZYLOPRIM) 100 MG tablet Take 1 tablet (100 mg total) by mouth daily. 10/27/18   Arnetha Courser, MD  alum & mag hydroxide-simeth (MI-ACID) 200-200-20 MG/5ML suspension Take 30 mLs by mouth every 6 (six) hours as needed for indigestion or heartburn.    [provider]  amoxicillin-clavulanate (AUGMENTIN) 875-125 MG tablet Take 1 tablet by mouth every 12 (twelve) hours for 10 days. 01/08/19 01/18/19  Orbie Pyo, MD  atorvastatin (LIPITOR) 20 MG tablet TAKE 1 TABLET BY MOUTH AT BEDTIME Patient taking differently: Take 20 mg by mouth daily.  06/07/18   Lada, Satira Anis, MD  busPIRone (BUSPAR) 5 MG tablet TAKE 1 TABLET BY MOUTH 2 TIMES DAILY FORDEPRESSION Patient taking differently: Take 5 mg by mouth 2 (two) times daily.  05/30/18   Lada, Satira Anis, MD  cholecalciferol (VITAMIN D) 1000 units tablet TAKE 1 TABLET BY MOUTH ONCE DAILY Patient taking differently: Take 1,000 Units by mouth daily.  04/13/18   Lada, Satira Anis, MD  donepezil (ARICEPT) 10 MG tablet TAKE 1 TABLET BY MOUTH BEDTIME Patient taking differently: Take 10 mg by mouth at bedtime. TAKE 1 TABLET BY MOUTH BEDTIME 10/27/18   Lada, Satira Anis, MD  guaifenesin (ROBITUSSIN) 100 MG/5ML syrup Take 200 mg by mouth every 6 (six) hours as needed for cough.     [provider]  loperamide (IMODIUM) 2 MG capsule Take 2 mg by mouth as needed for diarrhea or loose stools.    [provider]  LORazepam (ATIVAN) 0.5 MG tablet Take 0.5 tablets (0.25 mg total) by mouth 2 (two) times daily. 11/09/18   Arnetha Courser, MD  magnesium hydroxide (MILK OF MAGNESIA)  400 MG/5ML suspension Take 30 mLs by mouth daily as needed for mild constipation.    [provider]  memantine (NAMENDA) 10 MG tablet Take 1 tablet (10 mg total) by mouth 2 (two) times daily. 10/27/18   Arnetha Courser, MD  metoprolol tartrate (LOPRESSOR) 25 MG tablet Take 1 tablet (25 mg total) by mouth 2 (two) times daily. 11/04/18   Wellington Hampshire, MD  neomycin-bacitracin-polymyxin (NEOSPORIN) [phone] ointment Apply topically 4 (four) times daily as needed.     [provider]  pantoprazole (PROTONIX) 40 MG tablet Take 1 tablet (40 mg total) by mouth daily. 10/27/18   Arnetha Courser, MD  Rivaroxaban (XARELTO) 15 MG TABS tablet TAKE 1 TABLET BY MOUTH DAILY WITH SUPPER Patient taking differently: Take 15 mg by mouth daily with supper. TAKE 1 TABLET BY MOUTH DAILY WITH SUPPER 10/06/18   Lada, Satira Anis, MD  vitamin B-12 (CYANOCOBALAMIN) 500 MCG tablet Take 500 mcg by mouth daily.    [provider]    Allergies Darvon [propoxyphene hcl]  Family History  Problem Relation Age of Onset  . Heart disease Father   . Gout Brother     Social History Social History   Tobacco Use  . Smoking status: Former Smoker    Packs/day: 0.25    Years: 1.00    Pack years: 0.25    Types: Cigarettes  . Smokeless tobacco: Never Used  Substance Use Topics  . Alcohol use: No    Alcohol/week: 0.0 standard drinks  . Drug use: No    Review of Systems Level 5 caveat: Unable to obtain review of systems due to dementia    ____________________________________________   PHYSICAL EXAM:  VITAL SIGNS: ED Triage Vitals  Enc Vitals Group     BP 01/15/19 0916 119/83     Pulse Rate 01/15/19 0916 84     Resp 01/15/19 0916 14     Temp 01/15/19 0916 98.1 F (36.7 C)     Temp Source 01/15/19 0916 Axillary     SpO2 01/15/19 0916 96 %     Weight 01/15/19 0915 148 lb (67.1 kg)     Height --      Head Circumference --      Peak Flow --      Pain Score --      Pain Loc --        Pain Edu? --      Excl. in [place]? --     Constitutional: Alert, disoriented.  Comfortable appearing. Eyes: Conjunctivae are normal.  Head: Atraumatic. Nose: No congestion/rhinnorhea. Mouth/Throat: Mucous membranes are dry.   Neck: Normal range of motion.  Cardiovascular: Normal rate, regular rhythm. Grossly normal heart sounds.  Good peripheral circulation. Respiratory: Normal respiratory effort.  No retractions. Lungs CTAB. Gastrointestinal:  No distention.  Musculoskeletal: Extremities warm and well perfused.  Neurologic: No gross focal neurologic deficits are appreciated.  Skin:  Skin is warm and dry.  Blanching, nontender erythematous rash to chest.  No extremity involvement.  No  mucosal involvement. Psychiatric: Calm and cooperative.  ____________________________________________   LABS (all labs ordered are listed, but only abnormal results are displayed)  Labs Reviewed - No data to display ____________________________________________  EKG  ED ECG REPORT I, Arta Silence, the attending physician, personally viewed and interpreted this ECG.  Date: 01/15/2019 EKG Time: 916 Rate: 93 Rhythm: Atrial fibrillation QRS Axis: normal Intervals: normal ST/T Wave abnormalities: normal Narrative Interpretation: no evidence of acute ischemia  ____________________________________________  RADIOLOGY    ____________________________________________   PROCEDURES  Procedure(s) performed: No  Procedures  Critical Care performed: No ____________________________________________   INITIAL IMPRESSION / ASSESSMENT AND PLAN / ED COURSE  Pertinent labs & imaging results that were available during my care of the patient were reviewed by me and considered in my medical decision making (see chart for details).  78 year old female with a history of dementia and other PMH as noted above presents with rash to her chest, acute onset today, with concern for possible allergic  reaction.  The patient was started on amoxicillin for UTI several days ago.  She is unable to give any specific complaints.  On exam the patient is comfortable appearing.  Her vital signs are normal.  She does have erythema to her anterior chest but no other rash elsewhere, and no wheezing, stridor, abnormal secretions, or other acute abnormalities on exam.  Her oropharynx is clear.  Overall presentation would be more consistent with a drug eruption rather than an allergic reaction.  There is no indication for lab work-up.  I think it would be prudent to discontinue the amoxicillin and start the patient on alternate antibiotic such as Keflex.  ----------------------------------------- 11:57 AM on 01/15/2019 -----------------------------------------  I further reviewed the patient's past records in epic.  There is a note referring to the presence of the rash on 1/22.  The patient was evaluated in the ED here on 1/19 and was started on amoxicillin for the UTI at that time, so she has now been on it for a full week.  Given this additional information, I do not think that she needs to be changed to a different medication.  There is no clinical evidence of ongoing UTI.  She is stable for discharge home.  RN called the facility to inform them of the findings, and written discharge instructions have also been provided including return precautions. ____________________________________________   FINAL CLINICAL IMPRESSION(S) / ED DIAGNOSES  Final diagnoses:  Rash      NEW MEDICATIONS STARTED DURING THIS VISIT:  New Prescriptions   No medications on file     Note:  This document was prepared using Dragon voice recognition software and may include unintentional dictation errors.    Arta Silence, MD 01/15/19 1158

## 2019-01-15 NOTE — ED Notes (Signed)
Report given to [place] med tech at [facility].

## 2019-01-15 NOTE — Discharge Instructions (Addendum)
We suspect most likely drug related rash due to the amoxicillin.  This can develop after several days of being on medication, rather than a normal allergic reaction which usually starts immediately.  The amoxicillin should be discontinued.  Since Jennifer Klein has been on the medication for a week, she does not need to be placed on another antibiotic.  She should follow-up with her primary care doctor within the next week.

## 2019-01-15 NOTE — ED Notes (Signed)
Pt transported back to facility. Report given to daughter [name] via VM.

## 2019-01-15 NOTE — ED Triage Notes (Signed)
Pt presents via EMS c/o allergic reaction per facility. Pt started on Amoxicillin x4 days agp for UTI per report. EMS reports redness to upper chest. No visible s/s of respiratory distress.

## 2019-01-16 ENCOUNTER — Inpatient Hospital Stay
Admission: EM | Admit: 2019-01-16 | Discharge: 2019-01-24 | DRG: 151 | Disposition: A | Discharge status: Skilled Nursing Facility | Payer: Medicare Other | Source: Skilled Nursing Facility | Attending: Family Medicine | Admitting: Family Medicine

## 2019-01-16 DIAGNOSIS — Z66 Do not resuscitate: Secondary | ICD-10-CM | POA: Diagnosis present

## 2019-01-16 DIAGNOSIS — M1009 Idiopathic gout, multiple sites: Secondary | ICD-10-CM | POA: Diagnosis not present

## 2019-01-16 DIAGNOSIS — Z8269 Family history of other diseases of the musculoskeletal system and connective tissue: Secondary | ICD-10-CM

## 2019-01-16 DIAGNOSIS — Z885 Allergy status to narcotic agent status: Secondary | ICD-10-CM

## 2019-01-16 DIAGNOSIS — Z7901 Long term (current) use of anticoagulants: Secondary | ICD-10-CM

## 2019-01-16 DIAGNOSIS — K5909 Other constipation: Secondary | ICD-10-CM | POA: Diagnosis present

## 2019-01-16 DIAGNOSIS — I129 Hypertensive chronic kidney disease with stage 1 through stage 4 chronic kidney disease, or unspecified chronic kidney disease: Secondary | ICD-10-CM | POA: Diagnosis present

## 2019-01-16 DIAGNOSIS — Z79899 Other long term (current) drug therapy: Secondary | ICD-10-CM

## 2019-01-16 DIAGNOSIS — Z515 Encounter for palliative care: Secondary | ICD-10-CM | POA: Diagnosis not present

## 2019-01-16 DIAGNOSIS — E785 Hyperlipidemia, unspecified: Secondary | ICD-10-CM | POA: Diagnosis present

## 2019-01-16 DIAGNOSIS — E870 Hyperosmolality and hypernatremia: Secondary | ICD-10-CM | POA: Diagnosis present

## 2019-01-16 DIAGNOSIS — R262 Difficulty in walking, not elsewhere classified: Secondary | ICD-10-CM | POA: Diagnosis not present

## 2019-01-16 DIAGNOSIS — Z9181 History of falling: Secondary | ICD-10-CM

## 2019-01-16 DIAGNOSIS — F0280 Dementia in other diseases classified elsewhere without behavioral disturbance: Secondary | ICD-10-CM | POA: Diagnosis present

## 2019-01-16 DIAGNOSIS — I872 Venous insufficiency (chronic) (peripheral): Secondary | ICD-10-CM | POA: Diagnosis present

## 2019-01-16 DIAGNOSIS — G309 Alzheimer's disease, unspecified: Secondary | ICD-10-CM | POA: Diagnosis present

## 2019-01-16 DIAGNOSIS — I480 Paroxysmal atrial fibrillation: Secondary | ICD-10-CM | POA: Diagnosis present

## 2019-01-16 DIAGNOSIS — W19XXXA Unspecified fall, initial encounter: Secondary | ICD-10-CM | POA: Diagnosis present

## 2019-01-16 DIAGNOSIS — F3289 Other specified depressive episodes: Secondary | ICD-10-CM | POA: Diagnosis not present

## 2019-01-16 DIAGNOSIS — K921 Melena: Secondary | ICD-10-CM | POA: Diagnosis present

## 2019-01-16 DIAGNOSIS — E876 Hypokalemia: Secondary | ICD-10-CM | POA: Diagnosis not present

## 2019-01-16 DIAGNOSIS — R58 Hemorrhage, not elsewhere classified: Secondary | ICD-10-CM | POA: Diagnosis not present

## 2019-01-16 DIAGNOSIS — D62 Acute posthemorrhagic anemia: Secondary | ICD-10-CM | POA: Diagnosis present

## 2019-01-16 DIAGNOSIS — I10 Essential (primary) hypertension: Secondary | ICD-10-CM | POA: Diagnosis not present

## 2019-01-16 DIAGNOSIS — S022XXD Fracture of nasal bones, subsequent encounter for fracture with routine healing: Secondary | ICD-10-CM | POA: Diagnosis not present

## 2019-01-16 DIAGNOSIS — R410 Disorientation, unspecified: Secondary | ICD-10-CM | POA: Diagnosis not present

## 2019-01-16 DIAGNOSIS — R040 Epistaxis: Secondary | ICD-10-CM

## 2019-01-16 DIAGNOSIS — F064 Anxiety disorder due to known physiological condition: Secondary | ICD-10-CM | POA: Diagnosis not present

## 2019-01-16 DIAGNOSIS — E538 Deficiency of other specified B group vitamins: Secondary | ICD-10-CM | POA: Diagnosis present

## 2019-01-16 DIAGNOSIS — N183 Chronic kidney disease, stage 3 (moderate): Secondary | ICD-10-CM | POA: Diagnosis present

## 2019-01-16 DIAGNOSIS — I4891 Unspecified atrial fibrillation: Secondary | ICD-10-CM | POA: Diagnosis not present

## 2019-01-16 DIAGNOSIS — F0390 Unspecified dementia without behavioral disturbance: Secondary | ICD-10-CM | POA: Diagnosis not present

## 2019-01-16 DIAGNOSIS — Z8249 Family history of ischemic heart disease and other diseases of the circulatory system: Secondary | ICD-10-CM | POA: Diagnosis not present

## 2019-01-16 DIAGNOSIS — M109 Gout, unspecified: Secondary | ICD-10-CM | POA: Diagnosis present

## 2019-01-16 DIAGNOSIS — S031XXS Dislocation of septal cartilage of nose, sequela: Secondary | ICD-10-CM | POA: Diagnosis not present

## 2019-01-16 DIAGNOSIS — R296 Repeated falls: Secondary | ICD-10-CM | POA: Diagnosis not present

## 2019-01-16 DIAGNOSIS — M810 Age-related osteoporosis without current pathological fracture: Secondary | ICD-10-CM | POA: Diagnosis present

## 2019-01-16 DIAGNOSIS — M2550 Pain in unspecified joint: Secondary | ICD-10-CM | POA: Diagnosis not present

## 2019-01-16 DIAGNOSIS — Z9851 Tubal ligation status: Secondary | ICD-10-CM

## 2019-01-16 DIAGNOSIS — E860 Dehydration: Secondary | ICD-10-CM | POA: Diagnosis present

## 2019-01-16 DIAGNOSIS — E569 Vitamin deficiency, unspecified: Secondary | ICD-10-CM | POA: Diagnosis not present

## 2019-01-16 DIAGNOSIS — I4510 Unspecified right bundle-branch block: Secondary | ICD-10-CM | POA: Diagnosis present

## 2019-01-16 DIAGNOSIS — R52 Pain, unspecified: Secondary | ICD-10-CM | POA: Diagnosis not present

## 2019-01-16 DIAGNOSIS — Z87891 Personal history of nicotine dependence: Secondary | ICD-10-CM

## 2019-01-16 DIAGNOSIS — Z7189 Other specified counseling: Secondary | ICD-10-CM | POA: Diagnosis not present

## 2019-01-16 DIAGNOSIS — S022XXA Fracture of nasal bones, initial encounter for closed fracture: Secondary | ICD-10-CM | POA: Diagnosis present

## 2019-01-16 DIAGNOSIS — K219 Gastro-esophageal reflux disease without esophagitis: Secondary | ICD-10-CM | POA: Diagnosis not present

## 2019-01-16 DIAGNOSIS — M6281 Muscle weakness (generalized): Secondary | ICD-10-CM | POA: Diagnosis not present

## 2019-01-16 DIAGNOSIS — R41841 Cognitive communication deficit: Secondary | ICD-10-CM | POA: Diagnosis not present

## 2019-01-16 DIAGNOSIS — Z7401 Bed confinement status: Secondary | ICD-10-CM | POA: Diagnosis not present

## 2019-01-16 DIAGNOSIS — R404 Transient alteration of awareness: Secondary | ICD-10-CM | POA: Diagnosis not present

## 2019-01-16 DIAGNOSIS — F0391 Unspecified dementia with behavioral disturbance: Secondary | ICD-10-CM | POA: Diagnosis not present

## 2019-01-16 LAB — CBC WITH DIFFERENTIAL/PLATELET
Abs Immature Granulocytes: 0.03 10*3/uL (ref 0.00–0.07)
Abs Immature Granulocytes: 0.07 10*3/uL (ref 0.00–0.07)
Basophils Absolute: 0.1 10*3/uL (ref 0.0–0.1)
Basophils Absolute: 0.1 10*3/uL (ref 0.0–0.1)
Basophils Relative: 1 %
Basophils Relative: 0 %
Eosinophils Absolute: 0.4 10*3/uL (ref 0.0–0.5)
Eosinophils Absolute: 0.4 10*3/uL (ref 0.0–0.5)
Eosinophils Relative: 5 %
Eosinophils Relative: 3 %
HCT: 39.4 % (ref 36.0–46.0)
HCT: 33.4 % — ABNORMAL LOW (ref 36.0–46.0)
Hemoglobin: 12.3 g/dL (ref 12.0–15.0)
Hemoglobin: 10.4 g/dL — ABNORMAL LOW (ref 12.0–15.0)
Immature Granulocytes: 0 %
Immature Granulocytes: 1 %
LYMPHS ABS: 1.5 10*3/uL (ref 0.7–4.0)
Lymphocytes Relative: 18 %
Lymphocytes Relative: 17 %
Lymphs Abs: 2.3 10*3/uL (ref 0.7–4.0)
MCH: 29.7 pg (ref 26.0–34.0)
MCH: 30.1 pg (ref 26.0–34.0)
MCHC: 31.2 g/dL (ref 30.0–36.0)
MCHC: 31.1 g/dL (ref 30.0–36.0)
MCV: 95.2 fL (ref 80.0–100.0)
MCV: 96.5 fL (ref 80.0–100.0)
MONO ABS: 0.8 10*3/uL (ref 0.1–1.0)
Monocytes Absolute: 0.5 10*3/uL (ref 0.1–1.0)
Monocytes Relative: 6 %
Monocytes Relative: 6 %
NRBC: 0 % (ref 0.0–0.2)
Neutro Abs: 5.7 10*3/uL (ref 1.7–7.7)
Neutro Abs: 9.9 10*3/uL — ABNORMAL HIGH (ref 1.7–7.7)
Neutrophils Relative %: 70 %
Neutrophils Relative %: 73 %
Platelets: 225 10*3/uL (ref 150–400)
Platelets: 302 10*3/uL (ref 150–400)
RBC: 4.14 MIL/uL (ref 3.87–5.11)
RBC: 3.46 MIL/uL — ABNORMAL LOW (ref 3.87–5.11)
RDW: 14.6 % (ref 11.5–15.5)
RDW: 14.7 % (ref 11.5–15.5)
WBC: 8.2 10*3/uL (ref 4.0–10.5)
WBC: 13.4 10*3/uL — ABNORMAL HIGH (ref 4.0–10.5)
nRBC: 0 % (ref 0.0–0.2)

## 2019-01-16 LAB — BASIC METABOLIC PANEL
Anion gap: 3 — ABNORMAL LOW (ref 5–15)
BUN: 20 mg/dL (ref 8–23)
CO2: 27 mmol/L (ref 22–32)
Calcium: 8.4 mg/dL — ABNORMAL LOW (ref 8.9–10.3)
Chloride: 117 mmol/L — ABNORMAL HIGH (ref 98–111)
Creatinine, Ser: 0.95 mg/dL (ref 0.44–1.00)
GFR calc Af Amer: >60 mL/min (ref >60)
GFR calc non Af Amer: 57 mL/min — ABNORMAL LOW (ref >60)
GLUCOSE: 104 mg/dL — AB (ref 70–99)
Potassium: 3.5 mmol/L (ref 3.5–5.1)
Sodium: 147 mmol/L — ABNORMAL HIGH (ref 135–145)

## 2019-01-16 LAB — URINALYSIS, COMPLETE (UACMP) WITH MICROSCOPIC
Bacteria, UA: NONE SEEN
Bilirubin Urine: NEGATIVE
Glucose, UA: NEGATIVE mg/dL
Ketones, ur: NEGATIVE mg/dL
Nitrite: NEGATIVE
Protein, ur: NEGATIVE mg/dL
Specific Gravity, Urine: 1.024 (ref 1.005–1.030)
pH: 5 (ref 5.0–8.0)

## 2019-01-16 LAB — TYPE AND SCREEN
ABO/RH(D): O POS
Antibody Screen: NEGATIVE

## 2019-01-16 LAB — HEMOGLOBIN AND HEMATOCRIT, BLOOD
HCT: 29.9 % — ABNORMAL LOW (ref 36.0–46.0)
HEMOGLOBIN: 9.3 g/dL — AB (ref 12.0–15.0)

## 2019-01-16 MED ORDER — SODIUM CHLORIDE 0.9 % IV SOLN
1.0000 g | Freq: Once | INTRAVENOUS | Status: AC
  Administered 2019-01-16: 1 g via INTRAVENOUS
  Filled 2019-01-16: qty 10

## 2019-01-16 MED ORDER — DONEPEZIL HCL 5 MG PO TABS
10.0000 mg | ORAL_TABLET | Freq: Every day | ORAL | Status: DC
  Administered 2019-01-16 – 2019-01-23 (×8): 10 mg via ORAL
  Filled 2019-01-16 (×9): qty 2

## 2019-01-16 MED ORDER — GUAIFENESIN 100 MG/5ML PO SOLN
200.0000 mg | Freq: Four times a day (QID) | ORAL | Status: DC | PRN
  Filled 2019-01-16: qty 10

## 2019-01-16 MED ORDER — SODIUM CHLORIDE 0.9 % IV BOLUS
500.0000 mL | Freq: Once | INTRAVENOUS | Status: AC
  Administered 2019-01-16: 500 mL via INTRAVENOUS

## 2019-01-16 MED ORDER — SODIUM CHLORIDE 0.9 % IV BOLUS
1000.0000 mL | Freq: Once | INTRAVENOUS | Status: AC
  Administered 2019-01-16: 1000 mL via INTRAVENOUS

## 2019-01-16 MED ORDER — METOPROLOL TARTRATE 25 MG PO TABS: 25.0000 mg | ORAL_TABLET | Freq: Once | ORAL | Status: DC

## 2019-01-16 MED ORDER — PANTOPRAZOLE SODIUM 40 MG PO TBEC
40.0000 mg | DELAYED_RELEASE_TABLET | Freq: Every day | ORAL | Status: DC
  Administered 2019-01-17 – 2019-01-18 (×2): 40 mg via ORAL
  Filled 2019-01-16 (×2): qty 1

## 2019-01-16 MED ORDER — LIDOCAINE HCL (PF) 1 % IJ SOLN
INTRAMUSCULAR | Status: AC
  Filled 2019-01-16: qty 5

## 2019-01-16 MED ORDER — METOPROLOL TARTRATE 25 MG PO TABS
25.0000 mg | ORAL_TABLET | Freq: Two times a day (BID) | ORAL | Status: DC
  Administered 2019-01-16 – 2019-01-24 (×15): 25 mg via ORAL
  Filled 2019-01-16 (×16): qty 1

## 2019-01-16 MED ORDER — VITAMIN D3 25 MCG (1000 UNIT) PO TABS
1000.0000 [IU] | ORAL_TABLET | Freq: Every day | ORAL | Status: DC
  Administered 2019-01-17 – 2019-01-24 (×8): 1000 [IU] via ORAL
  Filled 2019-01-16 (×8): qty 1

## 2019-01-16 MED ORDER — TRANEXAMIC ACID 1000 MG/10ML IV SOLN
500.0000 mg | Freq: Once | INTRAVENOUS | Status: AC
  Administered 2019-01-16: 500 mg via TOPICAL
  Filled 2019-01-16: qty 10

## 2019-01-16 MED ORDER — METOPROLOL TARTRATE 5 MG/5ML IV SOLN
5.0000 mg | Freq: Once | INTRAVENOUS | Status: AC
  Administered 2019-01-16: 5 mg via INTRAVENOUS
  Filled 2019-01-16: qty 5

## 2019-01-16 MED ORDER — OXYMETAZOLINE HCL 0.05 % NA SOLN
1.0000 | Freq: Once | NASAL | Status: AC
  Administered 2019-01-16: 1 via NASAL
  Filled 2019-01-16: qty 15

## 2019-01-16 MED ORDER — VITAMIN B-12 1000 MCG PO TABS
500.0000 ug | ORAL_TABLET | Freq: Every day | ORAL | Status: DC
  Administered 2019-01-17 – 2019-01-24 (×8): 500 ug via ORAL
  Filled 2019-01-16 (×9): qty 1

## 2019-01-16 MED ORDER — ONDANSETRON HCL 4 MG/2ML IJ SOLN: 4.0000 mg | Freq: Four times a day (QID) | INTRAMUSCULAR | Status: DC | PRN

## 2019-01-16 MED ORDER — METOCLOPRAMIDE HCL 5 MG/ML IJ SOLN
10.0000 mg | Freq: Once | INTRAMUSCULAR | Status: AC
  Administered 2019-01-16: 10 mg via INTRAVENOUS
  Filled 2019-01-16: qty 2

## 2019-01-16 MED ORDER — ACETAMINOPHEN 325 MG PO TABS: 650.0000 mg | ORAL_TABLET | Freq: Four times a day (QID) | ORAL | Status: DC | PRN

## 2019-01-16 MED ORDER — METOPROLOL TARTRATE 5 MG/5ML IV SOLN
5.0000 mg | INTRAVENOUS | Status: DC | PRN
  Administered 2019-01-16: 5 mg via INTRAVENOUS
  Filled 2019-01-16 (×2): qty 5

## 2019-01-16 MED ORDER — ALLOPURINOL 100 MG PO TABS
100.0000 mg | ORAL_TABLET | Freq: Every day | ORAL | Status: DC
  Administered 2019-01-17 – 2019-01-24 (×8): 100 mg via ORAL
  Filled 2019-01-16 (×8): qty 1

## 2019-01-16 MED ORDER — DIPHENHYDRAMINE HCL 50 MG/ML IJ SOLN
25.0000 mg | Freq: Once | INTRAMUSCULAR | Status: AC
  Administered 2019-01-16: 25 mg via INTRAVENOUS
  Filled 2019-01-16: qty 1

## 2019-01-16 MED ORDER — LORAZEPAM 0.5 MG PO TABS
0.2500 mg | ORAL_TABLET | Freq: Two times a day (BID) | ORAL | Status: DC
  Administered 2019-01-16 – 2019-01-24 (×16): 0.25 mg via ORAL
  Filled 2019-01-16 (×16): qty 1

## 2019-01-16 MED ORDER — BACITRACIN-NEOMYCIN-POLYMYXIN 400-5-5000 EX OINT
1.0000 "application " | TOPICAL_OINTMENT | Freq: Four times a day (QID) | CUTANEOUS | Status: DC | PRN
  Administered 2019-01-18 – 2019-01-19 (×2): 1 via TOPICAL
  Filled 2019-01-16 (×3): qty 1

## 2019-01-16 MED ORDER — LIDOCAINE HCL 4 % EX SOLN
Freq: Once | CUTANEOUS | Status: AC
  Administered 2019-01-16: 06:00:00 via TOPICAL
  Filled 2019-01-16: qty 50

## 2019-01-16 MED ORDER — BUSPIRONE HCL 5 MG PO TABS
5.0000 mg | ORAL_TABLET | Freq: Two times a day (BID) | ORAL | Status: DC
  Administered 2019-01-16 – 2019-01-24 (×16): 5 mg via ORAL
  Filled 2019-01-16 (×17): qty 1

## 2019-01-16 MED ORDER — ATORVASTATIN CALCIUM 20 MG PO TABS
20.0000 mg | ORAL_TABLET | Freq: Every day | ORAL | Status: DC
  Administered 2019-01-16 – 2019-01-23 (×8): 20 mg via ORAL
  Filled 2019-01-16 (×9): qty 1

## 2019-01-16 MED ORDER — ONDANSETRON HCL 4 MG PO TABS: 4.0000 mg | ORAL_TABLET | Freq: Four times a day (QID) | ORAL | Status: DC | PRN

## 2019-01-16 MED ORDER — MEMANTINE HCL 10 MG PO TABS
10.0000 mg | ORAL_TABLET | Freq: Two times a day (BID) | ORAL | Status: DC
  Administered 2019-01-16 – 2019-01-24 (×16): 10 mg via ORAL
  Filled 2019-01-16 (×18): qty 1

## 2019-01-16 MED ORDER — ACETAMINOPHEN 650 MG RE SUPP: 650.0000 mg | Freq: Four times a day (QID) | RECTAL | Status: DC | PRN

## 2019-01-16 MED ORDER — SODIUM CHLORIDE 0.9 % IV SOLN
1.0000 g | INTRAVENOUS | Status: DC
  Administered 2019-01-17: 1 g via INTRAVENOUS
  Filled 2019-01-16: qty 10
  Filled 2019-01-16: qty 1

## 2019-01-16 NOTE — ED Provider Notes (Signed)
[facility] Emergency Department Provider Note   ____________________________________________   First MD Initiated Contact with Patient 01/16/19 [phone]     (approximate)  I have reviewed the triage vital signs and the nursing notes.   HISTORY  Chief Complaint Epistaxis  Level V caveat: Limited due to dementia  HPI Jennifer Klein is a 78 y.o. female brought to the ED via EMS from [facility] health with a chief complaint of nosebleed.   Patient was seen in the ED on 01/08/2019 status post mechanical fall and found to have comminuted nasal fracture as well as septum fracture.  She is on Xarelto.  Staff reports they checked on her at 4 AM and found her to have left-sided nosebleed.  Patient has dementia and is normally nonverbal except for singing.  Patient denies pain, chest pain, shortness of breath, nausea, vomiting, lightheadedness.  Reports of acute trauma or injury this morning.   Past Medical History:  Diagnosis Date  . Alzheimer's dementia with behavioral disturbance ([place]) 05/04/2016  . Alzheimer's dementia without behavioral disturbance ([place]) 05/04/2016  . B12 deficiency 05/06/2016  . Gout   . Hemorrhoids   . Hyperlipidemia LDL goal <100 05/05/2016  . Hypertension   . Memory loss   . PAF (paroxysmal atrial fibrillation) ([place])    a. on Xarelto; b. 48-hr Holter 2014: NSR with PAF/flutter which happened at least 2.26% of the time w/ associated tachycardia. Patient was asymptomatic; c. CHADS2VASc => 4 (HTN, age x 2, female)  . Paroxysmal atrial fibrillation ([place]) 08/07/2013  . Senile osteoporosis   . Systolic dysfunction    a. echo 08/2013: EF 50-55%, mild MR, mildly dilated LA    Patient Active Problem List   Diagnosis Date Noted  . Sepsis ([place]) 06/23/2018  . Fall 09/10/2017  . PAD (peripheral artery disease) ([place]) 06/22/2017  . Chronic venous insufficiency 06/22/2017  . CKD (chronic kidney disease) stage 3, GFR 30-59 ml/min (HCC) 09/04/2016  . B12  deficiency 05/06/2016  . Hyperlipidemia LDL goal <100 05/05/2016  . Alzheimer's dementia without behavioral disturbance ([place]) 05/04/2016  . Medication monitoring encounter 05/04/2016  . Barrett's esophagus 11/09/2015  . Paroxysmal atrial fibrillation ([place]) 08/07/2013  . Osteopenia 08/08/2012  . Hypertension 06/03/2012  . Gout 06/03/2012    Past Surgical History:  Procedure Laterality Date  . TUBAL LIGATION      Prior to Admission medications   Medication Sig Start Date End Date Taking? Authorizing Provider  acetaminophen (TYLENOL) 500 MG tablet Take 500 mg by mouth every 6 (six) hours as needed.    [provider]  allopurinol (ZYLOPRIM) 100 MG tablet Take 1 tablet (100 mg total) by mouth daily. 10/27/18   Arnetha Courser, MD  alum & mag hydroxide-simeth (MI-ACID) 200-200-20 MG/5ML suspension Take 30 mLs by mouth every 6 (six) hours as needed for indigestion or heartburn.    [provider]  amoxicillin-clavulanate (AUGMENTIN) 875-125 MG tablet Take 1 tablet by mouth every 12 (twelve) hours for 10 days. 01/08/19 01/18/19  Orbie Pyo, MD  atorvastatin (LIPITOR) 20 MG tablet TAKE 1 TABLET BY MOUTH AT BEDTIME Patient taking differently: Take 20 mg by mouth daily.  06/07/18   Lada, Satira Anis, MD  busPIRone (BUSPAR) 5 MG tablet TAKE 1 TABLET BY MOUTH 2 TIMES DAILY FORDEPRESSION Patient taking differently: Take 5 mg by mouth 2 (two) times daily.  05/30/18   Lada, Satira Anis, MD  cholecalciferol (VITAMIN D) 1000 units tablet TAKE 1 TABLET BY MOUTH ONCE DAILY Patient  taking differently: Take 1,000 Units by mouth daily.  04/13/18   Lada, Satira Anis, MD  donepezil (ARICEPT) 10 MG tablet TAKE 1 TABLET BY MOUTH BEDTIME Patient taking differently: Take 10 mg by mouth at bedtime. TAKE 1 TABLET BY MOUTH BEDTIME 10/27/18   Lada, Satira Anis, MD  guaifenesin (ROBITUSSIN) 100 MG/5ML syrup Take 200 mg by mouth every 6 (six) hours as needed for cough.     [provider]    loperamide (IMODIUM) 2 MG capsule Take 2 mg by mouth as needed for diarrhea or loose stools.    [provider]  LORazepam (ATIVAN) 0.5 MG tablet Take 0.5 tablets (0.25 mg total) by mouth 2 (two) times daily. 11/09/18   Arnetha Courser, MD  magnesium hydroxide (MILK OF MAGNESIA) 400 MG/5ML suspension Take 30 mLs by mouth daily as needed for mild constipation.    [provider]  memantine (NAMENDA) 10 MG tablet Take 1 tablet (10 mg total) by mouth 2 (two) times daily. 10/27/18   Arnetha Courser, MD  metoprolol tartrate (LOPRESSOR) 25 MG tablet Take 1 tablet (25 mg total) by mouth 2 (two) times daily. 11/04/18   Wellington Hampshire, MD  neomycin-bacitracin-polymyxin (NEOSPORIN) [id number] ointment Apply topically 4 (four) times daily as needed.     [provider]  pantoprazole (PROTONIX) 40 MG tablet Take 1 tablet (40 mg total) by mouth daily. 10/27/18   Arnetha Courser, MD  Rivaroxaban (XARELTO) 15 MG TABS tablet TAKE 1 TABLET BY MOUTH DAILY WITH SUPPER Patient taking differently: Take 15 mg by mouth daily with supper. TAKE 1 TABLET BY MOUTH DAILY WITH SUPPER 10/06/18   Lada, Satira Anis, MD  vitamin B-12 (CYANOCOBALAMIN) 500 MCG tablet Take 500 mcg by mouth daily.    [provider]    Allergies Darvon [propoxyphene hcl]  Family History  Problem Relation Age of Onset  . Heart disease Father   . Gout Brother     Social History Social History   Tobacco Use  . Smoking status: Former Smoker    Packs/day: 0.25    Years: 1.00    Pack years: 0.25    Types: Cigarettes  . Smokeless tobacco: Never Used  Substance Use Topics  . Alcohol use: No    Alcohol/week: 0.0 standard drinks  . Drug use: No    Review of Systems  Constitutional: No fever/chills Eyes: No visual changes. ENT: Positive for nosebleed.  No sore throat. Cardiovascular: Denies chest pain. Respiratory: Denies shortness of breath. Gastrointestinal: No abdominal pain.  No nausea, no  vomiting.  No diarrhea.  No constipation. Genitourinary: Negative for dysuria. Musculoskeletal: Negative for back pain. Skin: Negative for rash. Neurological: Negative for headaches, focal weakness or numbness.   ____________________________________________   PHYSICAL EXAM:  VITAL SIGNS: ED Triage Vitals  Enc Vitals Group     BP      Pulse      Resp      Temp      Temp src      SpO2      Weight      Height      Head Circumference      Peak Flow      Pain Score      Pain Loc      Pain Edu?      Excl. in [place]?     Constitutional: Alert and oriented. Well appearing and in no acute distress. Eyes: Conjunctivae are normal. PERRL. EOMI. Head: Atraumatic. Nose: Small  trickle of blood from left nares. Mouth/Throat: Mucous membranes are moist.  Oropharynx non-erythematous. Neck: No stridor.   Cardiovascular: Normal rate, regular rhythm. Grossly normal heart sounds.  Good peripheral circulation. Respiratory: Normal respiratory effort.  No retractions. Lungs CTAB. Gastrointestinal: Soft and nontender. No distention. No abdominal bruits. No CVA tenderness. Musculoskeletal: No lower extremity tenderness nor edema.  No joint effusions. Neurologic:  Normal speech and language. No gross focal neurologic deficits are appreciated. No gait instability. Skin:  Skin is warm, dry and intact. No rash noted. Psychiatric: Mood and affect are normal. Speech and behavior are normal.  ____________________________________________   LABS (all labs ordered are listed, but only abnormal results are displayed)  Labs Reviewed  BASIC METABOLIC PANEL - Abnormal; Notable for the following components:      Result Value   Sodium 147 (*)    Chloride 117 (*)    Glucose, Bld 104 (*)    Calcium 8.4 (*)    GFR calc non Af Amer 57 (*)    Anion gap 3 (*)    All other components within normal limits  CBC WITH DIFFERENTIAL/PLATELET    ____________________________________________  EKG  None ____________________________________________  RADIOLOGY  ED MD interpretation: None  Official radiology report(s): No results found.  ____________________________________________   PROCEDURES  Procedure(s) performed: None  Procedures  Critical Care performed: No  ____________________________________________   INITIAL IMPRESSION / ASSESSMENT AND PLAN / ED COURSE  As part of my medical decision making, I reviewed the following data within the [place] notes reviewed and incorporated, Labs reviewed, Old chart reviewed and Notes from prior ED visits    78 year old female with dementia who presents with nosebleed.  [place] last week with nasal fracture; patient on Xarelto.  It would be difficult to apply Merocel packing and to have patient keep it in as she keeps fiddling with her nose.  Will try lidocaine with Afrin and TXA cotton balls and nasal clamp.   Clinical Course as of Jan 16 702  Mon Jan 16, 2019  9937 Removed nasal clamp.  Bleeding stopped.  Inserted a cotton ball soaked with lidocaine and Afrin.  Nostril reclamped.  Will recheck.   [JS]  [id number] Cotton ball removed along with large blood clot.  No active bleeding but there is a suggestion of trickle to the left nares.  Soaked 2 x 2 gauze with TXA and inserted into the left nostril.   [JS]  0702 Removed TXA soaked gauze.  No active bleeding.  Patient will finish IV fluids and be discharged back to [facility].  No family present.  Strict return precautions given.  Patient verbalizes understanding to the best of her ability and agrees with plan of care.   [JS]    Clinical Course User Index [JS] Paulette Blanch, MD     ____________________________________________   FINAL CLINICAL IMPRESSION(S) / ED DIAGNOSES  Final diagnoses:  Left-sided epistaxis  Hypernatremia     ED Discharge Orders    None       Note:  This  document was prepared using Dragon voice recognition software and may include unintentional dictation errors.    Paulette Blanch, MD 01/16/19 [phone]

## 2019-01-16 NOTE — ED Provider Notes (Signed)
-----------------------------------------   10:11 AM on 01/16/2019 -----------------------------------------  I took over care on this patient from Dr. Beather Arbour.  At the time of shift change, the patient had controlled epistaxis and was awaiting transportation back to her facility.  After EMS arrived to transport her, she had an episode of bloody emesis, and appeared pale.  Her blood pressure which had been stable up to that point was found to be low.  On my reassessment, the blood pressure is coming up and the heart rate is coming back down.  The patient continues to appear comfortable.  She has no GI bleed history that I am aware of and overall the clinical picture is consistent with her vomiting the blood that was in her stomach from her prior epistaxis.  The epistaxis has not recurred.  At this time I will recheck a CBC and obtain a type and screen.  We will also give a fluid bolus if the CBC is stable and the patient returns to her baseline, she still may be able to go back to the facility.  ED ECG REPORT I, Arta Silence, the attending physician, personally viewed and interpreted this ECG.  Date: 01/16/2019 EKG Time: 1024 Rate: 128 Rhythm: Atrial fibrillation QRS Axis: normal Intervals: RBBB ST/T Wave abnormalities: Nonspecific ST abnormalities Narrative Interpretation: no evidence of acute ischemia; other than atrial fibrillation, no significant change when compared to EKG performed yesterday  ----------------------------------------- 1:07 PM on 01/16/2019 -----------------------------------------  The patient continued to be tachycardic in atrial fibrillation after fluids but her blood pressure improved, so I gave both IV metoprolol as well as her normal 25 mg p.o. dose.  Her heart rate is now in the 110s.  The patient developed recurrent epistaxis and a new gauze was placed although it soaked through.  It seems that most the blood is going posteriorly and the patient is  intermittently coughing.  She also had 2 bowel movements with melena-like material.  Again I do not suspect a GI bleed but rather that the patient has ingested significant blood from the epistaxis.  Repeat CBC shows hemoglobin drop from 12-10 although this may not be clinically significant.  Due to the recurrent bleeding and with the help of an ED tech I was able to place packing with a Merocel and this appears to have controlled the bleeding.  The patient is tolerating it and not trying to pull at it.  At this time the patient is still tachycardic between 100-110 and with a borderline blood pressure.  Especially now after the Merocel, it does not appear that the patient is having acute life-threatening bleeding that would require administration of [place] to reverse the Xarelto.  However, because of the persistent bleeding and her vital signs she will require admission for further monitoring.  I consulted Dr. Tami Ribas from ENT to discuss the case; he agrees with my management so far and has no further recommendations at this time other than controlling the patient's coagulopathy.  I signed the patient out to the hospitalist Dr. Posey Pronto at approximately 1:15PM.    Arta Silence, MD 01/16/19 1318

## 2019-01-16 NOTE — Progress Notes (Signed)
Patient having bloody stools (dark tarry with red blood mixed in) and blood in urine.  Hemoglobin stable at this time.  Will continue to monitor.    HR elevated d/t anemia/GI bleed.  Metoprolol ordered PRN for HR sustaining over 130.

## 2019-01-16 NOTE — ED Notes (Signed)
Tammy is coming in to sit with pt.

## 2019-01-16 NOTE — ED Notes (Addendum)
Changed pt's linens,top,socks, and combed pt's hair this morning. Cleaned pt up with warm wash cloth before her transportation arrives.

## 2019-01-16 NOTE — ED Notes (Signed)
Suction pt with RN in room.

## 2019-01-16 NOTE — ED Notes (Signed)
Pt will not leave nose clamp on and is becoming very restless.

## 2019-01-16 NOTE — ED Triage Notes (Signed)
Pt arrived from [facility] via EMS with complaints of a nosebleed. Facility were concerned because pt fell last week and fractured her nose at that time. Pt has Dementia. Vs per EMS BP-152/81 HR-87. Pt nose is still actively bleeding. Dr. Beather Arbour at bedside. Pt is on a blood thinner.

## 2019-01-16 NOTE — ED Notes (Signed)
Transportation has arrived.

## 2019-01-16 NOTE — H&P (Signed)
[facility] NAME: Jennifer Klein    MR#:  [id number]  DATE OF BIRTH:  1941/08/27  DATE OF ADMISSION:  01/16/2019  PRIMARY CARE PHYSICIAN: Arnetha Courser, MD   REQUESTING/REFERRING PHYSICIAN: dr Cherylann Banas  CHIEF COMPLAINT:  nose bleeding since yesterday and intractable nausea vomiting with dark stools.  HISTORY OF PRESENT ILLNESS:  Jennifer Klein  is a 78 y.o. female with a known history of chronic atrial fibrillation on Xarelto, severe advanced dementia, B12 deficiency, hyperlipidemia comes to the emergency room from assisted living when she was found to have epistaxis as noted by staff at the facility. Patient had a mechanical fall and was seen in the emergency room where she was found to have the nasal septum fracture 119 2020 after she had a mechanical fall.  Left knee air packing was done by ER physician. ER physician spoke with ENT Dr. Tami Ribas who recommended called ENT if bleeding beakers else continue the packing and prophylactic antibiotic  Patient was ready to be sent back to the facility however she started having vomiting and had some coffee ground emesis along with tachycardia and heart rate went into the 120s.  She received IV fluids, IV metoprolol. Currently she is hemodynamically stable with heart rate in the one tends. She is being admitted with acute on chronic a fib, epistaxis and possible G.I. bleed.  pt also noted to have couple dark blackish colored stools while in the emergency room.  Patient has advanced dementia unable to give any history review of systems.  Spoke with patient's been Jennifer Klein his number is [phone]  PAST MEDICAL HISTORY:   Past Medical History:  Diagnosis Date  . Alzheimer's dementia with behavioral disturbance ([place]) 05/04/2016  . Alzheimer's dementia without behavioral disturbance ([place]) 05/04/2016  . B12 deficiency 05/06/2016  . Gout   . Hemorrhoids   . Hyperlipidemia LDL goal  <100 05/05/2016  . Hypertension   . Memory loss   . PAF (paroxysmal atrial fibrillation) ([place])    a. on Xarelto; b. 48-hr Holter 2014: NSR with PAF/flutter which happened at least 2.26% of the time w/ associated tachycardia. Patient was asymptomatic; c. CHADS2VASc => 4 (HTN, age x 2, female)  . Paroxysmal atrial fibrillation ([place]) 08/07/2013  . Senile osteoporosis   . Systolic dysfunction    a. echo 08/2013: EF 50-55%, mild MR, mildly dilated LA    PAST SURGICAL HISTOIRY:   Past Surgical History:  Procedure Laterality Date  . TUBAL LIGATION      SOCIAL HISTORY:   Social History   Tobacco Use  . Smoking status: Former Smoker    Packs/day: 0.25    Years: 1.00    Pack years: 0.25    Types: Cigarettes  . Smokeless tobacco: Never Used  Substance Use Topics  . Alcohol use: No    Alcohol/week: 0.0 standard drinks    FAMILY HISTORY:   Family History  Problem Relation Age of Onset  . Heart disease Father   . Gout Brother     DRUG ALLERGIES:   Allergies  Allergen Reactions  . Darvon [Propoxyphene Hcl]     As a child    REVIEW OF SYSTEMS:  Review of Systems  Unable to perform ROS: Dementia     MEDICATIONS AT HOME:   Prior to Admission medications   Medication Sig Start Date End Date Taking? Authorizing Provider  acetaminophen (TYLENOL) 500 MG tablet Take 500 mg by mouth every 6 (six) hours  as needed.    [provider]  allopurinol (ZYLOPRIM) 100 MG tablet Take 1 tablet (100 mg total) by mouth daily. 10/27/18   Arnetha Courser, MD  alum & mag hydroxide-simeth (MI-ACID) 200-200-20 MG/5ML suspension Take 30 mLs by mouth every 6 (six) hours as needed for indigestion or heartburn.    [provider]  atorvastatin (LIPITOR) 20 MG tablet TAKE 1 TABLET BY MOUTH AT BEDTIME Patient taking differently: Take 20 mg by mouth daily.  06/07/18   Lada, Satira Anis, MD  busPIRone (BUSPAR) 5 MG tablet TAKE 1 TABLET BY MOUTH 2 TIMES DAILY FORDEPRESSION Patient taking  differently: Take 5 mg by mouth 2 (two) times daily.  05/30/18   Lada, Satira Anis, MD  cholecalciferol (VITAMIN D) 1000 units tablet TAKE 1 TABLET BY MOUTH ONCE DAILY Patient taking differently: Take 1,000 Units by mouth daily.  04/13/18   Lada, Satira Anis, MD  donepezil (ARICEPT) 10 MG tablet TAKE 1 TABLET BY MOUTH BEDTIME Patient taking differently: Take 10 mg by mouth at bedtime. TAKE 1 TABLET BY MOUTH BEDTIME 10/27/18   Lada, Satira Anis, MD  guaifenesin (ROBITUSSIN) 100 MG/5ML syrup Take 200 mg by mouth every 6 (six) hours as needed for cough.     [provider]  loperamide (IMODIUM) 2 MG capsule Take 2 mg by mouth as needed for diarrhea or loose stools.    [provider]  LORazepam (ATIVAN) 0.5 MG tablet Take 0.5 tablets (0.25 mg total) by mouth 2 (two) times daily. 11/09/18   Arnetha Courser, MD  magnesium hydroxide (MILK OF MAGNESIA) 400 MG/5ML suspension Take 30 mLs by mouth daily as needed for mild constipation.    [provider]  memantine (NAMENDA) 10 MG tablet Take 1 tablet (10 mg total) by mouth 2 (two) times daily. 10/27/18   Arnetha Courser, MD  metoprolol tartrate (LOPRESSOR) 25 MG tablet Take 1 tablet (25 mg total) by mouth 2 (two) times daily. 11/04/18   Wellington Hampshire, MD  neomycin-bacitracin-polymyxin (NEOSPORIN) [phone] ointment Apply topically 4 (four) times daily as needed.     [provider]  pantoprazole (PROTONIX) 40 MG tablet Take 1 tablet (40 mg total) by mouth daily. 10/27/18   Arnetha Courser, MD  Rivaroxaban (XARELTO) 15 MG TABS tablet TAKE 1 TABLET BY MOUTH DAILY WITH SUPPER Patient taking differently: Take 15 mg by mouth daily with supper. TAKE 1 TABLET BY MOUTH DAILY WITH SUPPER 10/06/18   Lada, Satira Anis, MD  vitamin B-12 (CYANOCOBALAMIN) 500 MCG tablet Take 500 mcg by mouth daily.    [provider]      VITAL SIGNS:  Blood pressure (!) 112/56, pulse (!) 112, temperature 98.7 F (37.1 C), temperature source Oral,  resp. rate (!) 23, height 5\' 5"  (1.651 m), weight 67.1 kg, SpO2 98 %.  PHYSICAL EXAMINATION:  GENERAL:  78 y.o.-year-old patient lying in the bed with no acute distress.  EYES: Pupils equal, round, reactive to light and accommodation. No scleral icterus. Extraocular muscles intact.  HEENT: Head atraumatic, normocephalic. Oropharynx and nasopharynx clear. Left nare packing oozed with blood -some old crusted blood over the lips  NECK:  Supple, no jugular venous distention. No thyroid enlargement, no tenderness.  LUNGS: Normal breath sounds bilaterally, no wheezing, rales,rhonchi or crepitation. No use of accessory muscles of respiration.  CARDIOVASCULAR: S1, S2 normal. No murmurs, rubs, or gallops.  ABDOMEN: Soft, nontender, nondistended. Bowel sounds present. No organomegaly or mass.  EXTREMITIES: No pedal edema, cyanosis, or  clubbing.  NEUROLOGIC: able to assess due to advanced dementia. Moves all extremities well. PSYCHIATRIC: The patient is alert and advance dementia  SKIN: No obvious rash, lesion, or ulcer.   LABORATORY PANEL:   CBC Recent Labs  Lab 01/16/19 1023  WBC 13.4*  HGB 10.4*  HCT 33.4*  PLT 302   ------------------------------------------------------------------------------------------------------------------  Chemistries  Recent Labs  Lab 01/16/19 0534  NA 147*  K 3.5  CL 117*  CO2 27  GLUCOSE 104*  BUN 20  CREATININE 0.95  CALCIUM 8.4*   ------------------------------------------------------------------------------------------------------------------  Cardiac Enzymes No results for input(s): TROPONINI in the last 168 hours. ------------------------------------------------------------------------------------------------------------------  RADIOLOGY:  No results found.  EKG:    IMPRESSION AND PLAN:   Judyann Casasola  is a 78 y.o. female with a known history of chronic atrial fibrillation on Xarelto, severe advanced dementia, B12 deficiency,  hyperlipidemia comes to the emergency room from assisted living when she was found to have epistaxis as noted by staff at the facility.  1. Epistaxis left nose in the setting of chronic anticoagulation with Xarelto -patient had a recent fall in mid January had nasal septal fracture -nasal packing done in the ER. -Consider consultation with Dr. Tami Ribas if needed. ER physician has spoken with Dr. Tami Ribas. Recommends continue packing follow-up with ENT as outpatient and prophylactic antibiotics -IV Rocephin (for UTI and posterior nasal packing)  2. Intractable vomiting -now better. Continue IV fluids  3. Melanotic stools/diarrhea in the ER -heme occult stools -discontinue Xarelto. I have discussed this with patient's husband on the phone he is agreeable with not restarting it at present. -Continue monitor H&H. -G.I. consultation if family wants to pursue further workup  4. Advanced dementia -patient is from an assisted living facility  5. DVT prophylaxis SCD's     All the records are reviewed and case discussed with ED provider.   CODE STATUS: DNR  TOTAL TIME TAKING CARE OF THIS PATIENT: *50* minutes.    Fritzi Mandes M.D on 01/16/2019 at 1:38 PM  Between 7am to 6pm - Pager - [phone]  After 6pm go to [email or web address] - password [email or web address] [facility]  [facility]  Office  [phone]  CC: Primary care physician; Arnetha Courser, MD

## 2019-01-16 NOTE — ED Notes (Signed)
Pt was being loaded up for D/C with EMS, pt vomited blood with ems x 1. MD made aware. Pt was placed back into room 18.

## 2019-01-16 NOTE — Progress Notes (Signed)
Family Meeting Note  Advance Directive:yes Today a meeting took place with the  husband Jennifer Klein on the phone  Patient has severe advanced dementia unable to give any history review of systems unable to address any code status issues. She is being admitted with epistaxis acute on chronic atrial fibrillation. Possible G.I. bleed. I discussed with patient's husband on the phone and patient is a DNR. Time spent during discussion:16 mins Fritzi Mandes, MD

## 2019-01-16 NOTE — ED Notes (Signed)
ED TO INPATIENT HANDOFF REPORT  Name/Age/Gender Jennifer Klein 78 y.o. female  Code Status Code Status History    Date Active Date Inactive Code Status Order ID Comments User Context   06/24/2018 0013 06/27/2018 2111 Full Code [id number]  Amelia Jo, MD Inpatient      Home/SNF/Other Nursing Home  Chief Complaint nose bleed  Level of Care/Admitting Diagnosis ED Disposition    ED Disposition Condition [facility] Area: [place] [[id number]]  Level of Care: Telemetry [5]  Diagnosis: Epistaxis [784.7.ICD-9-CM]  Admitting Physician: Odessa Fleming  Attending Physician: Odessa Fleming  Estimated length of stay: past midnight tomorrow  Certification:: I certify this patient will need inpatient services for at least 2 midnights  PT Class (Do Not Modify): Inpatient [101]  PT Acc Code (Do Not Modify): Private [1]       Medical History Past Medical History:  Diagnosis Date  . Alzheimer's dementia with behavioral disturbance ([place]) 05/04/2016  . Alzheimer's dementia without behavioral disturbance ([place]) 05/04/2016  . B12 deficiency 05/06/2016  . Gout   . Hemorrhoids   . Hyperlipidemia LDL goal <100 05/05/2016  . Hypertension   . Memory loss   . PAF (paroxysmal atrial fibrillation) ([place])    a. on Xarelto; b. 48-hr Holter 2014: NSR with PAF/flutter which happened at least 2.26% of the time w/ associated tachycardia. Patient was asymptomatic; c. CHADS2VASc => 4 (HTN, age x 2, female)  . Paroxysmal atrial fibrillation ([place]) 08/07/2013  . Senile osteoporosis   . Systolic dysfunction    a. echo 08/2013: EF 50-55%, mild MR, mildly dilated LA    Allergies Allergies  Allergen Reactions  . Darvon [Propoxyphene Hcl]     As a child    IV Location/Drains/Wounds Patient Lines/Drains/Airways Status   Active Line/Drains/Airways    Name:   Placement date:   Placement time:   Site:   Days:   Peripheral IV 01/16/19 Left Antecubital   01/16/19    1034     Antecubital   less than 1          Labs/Imaging Results for orders placed or performed during the hospital encounter of 01/16/19 (from the past 48 hour(s))  CBC with Differential     Status: None   Collection Time: 01/16/19  5:34 AM  Result Value Ref Range   WBC 8.2 4.0 - 10.5 K/uL   RBC 4.14 3.87 - 5.11 MIL/uL   Hemoglobin 12.3 12.0 - 15.0 g/dL   HCT 39.4 36.0 - 46.0 %   MCV 95.2 80.0 - 100.0 fL   MCH 29.7 26.0 - 34.0 pg   MCHC 31.2 30.0 - 36.0 g/dL   RDW 14.6 11.5 - 15.5 %   Platelets 225 150 - 400 K/uL   nRBC 0.0 0.0 - 0.2 %   Neutrophils Relative % 70 %   Neutro Abs 5.7 1.7 - 7.7 K/uL   Lymphocytes Relative 18 %   Lymphs Abs 1.5 0.7 - 4.0 K/uL   Monocytes Relative 6 %   Monocytes Absolute 0.5 0.1 - 1.0 K/uL   Eosinophils Relative 5 %   Eosinophils Absolute 0.4 0.0 - 0.5 K/uL   Basophils Relative 1 %   Basophils Absolute 0.1 0.0 - 0.1 K/uL   Immature Granulocytes 0 %   Abs Immature Granulocytes 0.03 0.00 - 0.07 K/uL    Comment: Performed at [facility], [address] [place], [place] [address]  Basic metabolic panel  Status: Abnormal   Collection Time: 01/16/19  5:34 AM  Result Value Ref Range   Sodium 147 (H) 135 - 145 mmol/L   Potassium 3.5 3.5 - 5.1 mmol/L   Chloride 117 (H) 98 - 111 mmol/L   CO2 27 22 - 32 mmol/L   Glucose, Bld 104 (H) 70 - 99 mg/dL   BUN 20 8 - 23 mg/dL   Creatinine, Ser 0.95 0.44 - 1.00 mg/dL   Calcium 8.4 (L) 8.9 - 10.3 mg/dL   GFR calc non Af Amer 57 (L) >60 mL/min   GFR calc Af Amer >60 >60 mL/min   Anion gap 3 (L) 5 - 15    Comment: Performed at [facility], [address] [place], [place] [address]  CBC with Differential     Status: Abnormal   Collection Time: 01/16/19 10:23 AM  Result Value Ref Range   WBC 13.4 (H) 4.0 - 10.5 K/uL   RBC 3.46 (L) 3.87 - 5.11 MIL/uL   Hemoglobin 10.4 (L) 12.0 - 15.0 g/dL   HCT 33.4 (L) 36.0 - 46.0 %   MCV 96.5 80.0 - 100.0 fL   MCH 30.1 26.0 - 34.0 pg   MCHC 31.1  30.0 - 36.0 g/dL   RDW 14.7 11.5 - 15.5 %   Platelets 302 150 - 400 K/uL   nRBC 0.0 0.0 - 0.2 %   Neutrophils Relative % 73 %   Neutro Abs 9.9 (H) 1.7 - 7.7 K/uL   Lymphocytes Relative 17 %   Lymphs Abs 2.3 0.7 - 4.0 K/uL   Monocytes Relative 6 %   Monocytes Absolute 0.8 0.1 - 1.0 K/uL   Eosinophils Relative 3 %   Eosinophils Absolute 0.4 0.0 - 0.5 K/uL   Basophils Relative 0 %   Basophils Absolute 0.1 0.0 - 0.1 K/uL   Immature Granulocytes 1 %   Abs Immature Granulocytes 0.07 0.00 - 0.07 K/uL    Comment: Performed at [facility], [address] [place], [place] [address]  Type and screen [place]     Status: None   Collection Time: 01/16/19 10:23 AM  Result Value Ref Range   ABO/RH(D) O POS    Antibody Screen NEG    Sample Expiration      01/19/2019 Performed at [facility], [address] [place], [place] [address]   Urinalysis, Complete w Microscopic     Status: Abnormal   Collection Time: 01/16/19 11:59 AM  Result Value Ref Range   Color, Urine YELLOW (A) YELLOW   APPearance CLOUDY (A) CLEAR   Specific Gravity, Urine 1.024 1.005 - 1.030   pH 5.0 5.0 - 8.0   Glucose, UA NEGATIVE NEGATIVE mg/dL   Hgb urine dipstick SMALL (A) NEGATIVE   Bilirubin Urine NEGATIVE NEGATIVE   Ketones, ur NEGATIVE NEGATIVE mg/dL   Protein, ur NEGATIVE NEGATIVE mg/dL   Nitrite NEGATIVE NEGATIVE   Leukocytes, UA MODERATE (A) NEGATIVE   RBC / HPF 0-5 0 - 5 RBC/hpf   WBC, UA 11-20 0 - 5 WBC/hpf   Bacteria, UA NONE SEEN NONE SEEN   Squamous Epithelial / LPF 21-50 0 - 5   WBC Clumps PRESENT    Mucus PRESENT     Comment: Performed at [facility], [address] [place], [place] [address]   No results found.  Pending Labs [employer] (From admission, onward)    Start     Ordered   Unscheduled  Occult blood card to  lab, stool  As needed,   STAT     01/16/19 1339   Signed and Held  Basic metabolic panel  Tomorrow morning,   R      Signed and Held   Signed and Held  CBC  Tomorrow morning,   R     Signed and Held          Vitals/Pain Today's Vitals   01/16/19 1051 01/16/19 1105 01/16/19 1115 01/16/19 1440  BP: 101/67 108/68 (!) 112/56 97/72  Pulse: (!) 120 67 (!) 112 (!) 118  Resp: (!) 22  (!) 23 18  Temp:      TempSrc:      SpO2: 92% 100% 98% 97%  Weight:      Height:        Isolation Precautions No active isolations  Medications Medications  lidocaine (PF) (XYLOCAINE) 1 % injection (has no administration in time range)  metoprolol tartrate (LOPRESSOR) injection 5 mg (has no administration in time range)  metoprolol tartrate (LOPRESSOR) tablet 25 mg (has no administration in time range)  cefTRIAXone (ROCEPHIN) 1 g in sodium chloride 0.9 % 100 mL IVPB (has no administration in time range)  tranexamic acid (CYKLOKAPRON) injection 500 mg (500 mg Topical Given by Other 01/16/19 0603)  oxymetazoline (AFRIN) 0.05 % nasal spray 1 spray (1 spray Each Nare Given 01/16/19 0533)  lidocaine (XYLOCAINE) 4 % external solution ( Topical Given by Other 01/16/19 0554)  sodium chloride 0.9 % bolus 1,000 mL (0 mLs Intravenous Stopped 01/16/19 0730)  sodium chloride 0.9 % bolus 500 mL (500 mLs Intravenous New Bag/Given 01/16/19 1034)  metoCLOPramide (REGLAN) injection 10 mg (10 mg Intravenous Given 01/16/19 1051)  diphenhydrAMINE (BENADRYL) injection 25 mg (25 mg Intravenous Given 01/16/19 1051)  metoprolol tartrate (LOPRESSOR) injection 5 mg (5 mg Intravenous Given 01/16/19 1139)    Mobility non-ambulatory

## 2019-01-16 NOTE — Discharge Instructions (Addendum)
1.  Hold Xarelto x 2 doses.   2.  If nose rebleeds, apply nasal clamp and apply ice to nasal bridge for 20 minutes.   3.   Return to the ER for worsening symptoms, persistent vomiting, heavy bleeding or other concerns.

## 2019-01-17 LAB — CBC
HCT: 30.9 % — ABNORMAL LOW (ref 36.0–46.0)
HEMOGLOBIN: 9.5 g/dL — AB (ref 12.0–15.0)
MCH: 29.3 pg (ref 26.0–34.0)
MCHC: 30.7 g/dL (ref 30.0–36.0)
MCV: 95.4 fL (ref 80.0–100.0)
Platelets: 242 10*3/uL (ref 150–400)
RBC: 3.24 MIL/uL — ABNORMAL LOW (ref 3.87–5.11)
RDW: 14.6 % (ref 11.5–15.5)
WBC: 11.4 10*3/uL — ABNORMAL HIGH (ref 4.0–10.5)
nRBC: 0 % (ref 0.0–0.2)

## 2019-01-17 LAB — BASIC METABOLIC PANEL
Anion gap: 5 (ref 5–15)
BUN: 38 mg/dL — ABNORMAL HIGH (ref 8–23)
CO2: 23 mmol/L (ref 22–32)
Calcium: 8.1 mg/dL — ABNORMAL LOW (ref 8.9–10.3)
Chloride: 119 mmol/L — ABNORMAL HIGH (ref 98–111)
Creatinine, Ser: 0.9 mg/dL (ref 0.44–1.00)
GFR calc Af Amer: >60 mL/min (ref >60)
GFR calc non Af Amer: >60 mL/min (ref >60)
Glucose, Bld: 107 mg/dL — ABNORMAL HIGH (ref 70–99)
Potassium: 3.8 mmol/L (ref 3.5–5.1)
SODIUM: 147 mmol/L — AB (ref 135–145)

## 2019-01-17 LAB — HEMOGLOBIN AND HEMATOCRIT, BLOOD
HCT: 29.7 % — ABNORMAL LOW (ref 36.0–46.0)
Hemoglobin: 9.2 g/dL — ABNORMAL LOW (ref 12.0–15.0)

## 2019-01-17 NOTE — Plan of Care (Signed)
  Problem: Education: Goal: Knowledge of General Education information will improve Description Including pain rating scale, medication(s)/side effects and non-pharmacologic comfort measures Outcome: Not Progressing Note:  Patient has severe dementia. Is mostly non-verbal except for when she's singing. Remains on a low bed. Impulsive. Has soft restraint mittens on. Medications crushed and administered in applesauce. Will continue to monitor. Wenda Low [facility]

## 2019-01-17 NOTE — Progress Notes (Signed)
[facility] at [place] NAME: Jennifer Klein    MR#:  [id number]  DATE OF BIRTH:  [birth year]  SUBJECTIVE:  CHIEF COMPLAINT:   Chief Complaint  Patient presents with  . Epistaxis  Patient seen today Has left nasal packing No new episodes of coffee ground emesis  REVIEW OF SYSTEMS:    ROS Unable to obtain secondary to dementia  DRUG ALLERGIES:   Allergies  Allergen Reactions  . Darvon [Propoxyphene Hcl]     As a child    VITALS:  Blood pressure (!) 106/56, pulse 84, temperature 98.6 F (37 C), temperature source Oral, resp. rate 18, height 5\' 5"  (1.651 m), weight 67.1 kg, SpO2 95 %.  PHYSICAL EXAMINATION:   Physical Exam  GENERAL:  78 y.o.-year-old patient lying in the bed in no acute distress EYES: Pupils equal, round, reactive to light and accommodation. No scleral icterus. Extraocular muscles intact.  HEENT: Head atraumatic, normocephalic. Oropharynx clear Nasal packing intact NECK:  Supple, no jugular venous distention. No thyroid enlargement, no tenderness.  LUNGS: Normal breath sounds bilaterally, no wheezing, rales, rhonchi. No use of accessory muscles of respiration.  CARDIOVASCULAR: S1, S2 normal. No murmurs, rubs, or gallops.  ABDOMEN: Soft, nontender, nondistended. Bowel sounds present. No organomegaly or mass.  EXTREMITIES: No cyanosis, clubbing or edema b/l.    NEUROLOGIC: Cranial nerves II through XII are intact. No focal Motor or sensory deficits b/l.   PSYCHIATRIC: could not be assessed SKIN: No obvious rash, lesion, or ulcer.   LABORATORY PANEL:   CBC Recent Labs  Lab 01/17/19 0052 01/17/19 0907  WBC 11.4*  --   HGB 9.5* 9.2*  HCT 30.9* 29.7*  PLT 242  --    ------------------------------------------------------------------------------------------------------------------ Chemistries  Recent Labs  Lab 01/17/19 0052  NA 147*  K 3.8  CL 119*  CO2 23  GLUCOSE 107*  BUN 38*  CREATININE 0.90   CALCIUM 8.1*   ------------------------------------------------------------------------------------------------------------------  Cardiac Enzymes No results for input(s): TROPONINI in the last 168 hours. ------------------------------------------------------------------------------------------------------------------  RADIOLOGY:  No results found.   ASSESSMENT AND PLAN:  78 year old female patient with history of chronic atrial fibrillation on Xarelto, advanced dementia, B12 deficiency, hyperlipidemia had a mechanical fall at assisted living facility and has a nasal septal fracture on 01/08/2019.  -Epistaxis left nose Anticoagulation with Xarelto stopped As nasal septal fractures secondary to fall in mid January Nasal packing done in the emergency room Case has been discussed with ENT by ER physician Recommended nasal packing and follow-up with them as outpatient Prophylactic antibiotics Continue IV Rocephin antibiotic  -Melanotic stools in the emergency room Of Xarelto Check Hemoccult Hemoglobin stable  -Advanced dementia Supportive care  -Nausea and vomiting improved Antiemetics and IV fluids  -DVT prophylaxis sequential compression device to lower extremities  All the records are reviewed and case discussed with Care Management/Social Worker. Management plans discussed with the patient, family and they are in agreement.  CODE STATUS: DNR  DVT Prophylaxis: SCDs  TOTAL TIME TAKING CARE OF THIS PATIENT: 36 minutes.   POSSIBLE D/C IN 2 to 3 DAYS, DEPENDING ON CLINICAL CONDITION.  Saundra Shelling M.D on 01/17/2019 at 2:09 PM  Between 7am to 6pm - Pager - [phone]  After 6pm go to [email or web address] - password [email or web address] [facility]  Office  [phone]  CC: Primary care physician; Arnetha Courser, MD  Note: This dictation was prepared with Dragon dictation along with smaller phrase technology. Any transcriptional errors that result from  this  process are unintentional.

## 2019-01-18 LAB — BASIC METABOLIC PANEL
Anion gap: 3 — ABNORMAL LOW (ref 5–15)
BUN: 23 mg/dL (ref 8–23)
CO2: 26 mmol/L (ref 22–32)
Calcium: 8.1 mg/dL — ABNORMAL LOW (ref 8.9–10.3)
Chloride: 116 mmol/L — ABNORMAL HIGH (ref 98–111)
Creatinine, Ser: 0.83 mg/dL (ref 0.44–1.00)
GFR calc Af Amer: >60 mL/min (ref >60)
GFR calc non Af Amer: >60 mL/min (ref >60)
Glucose, Bld: 118 mg/dL — ABNORMAL HIGH (ref 70–99)
POTASSIUM: 3.7 mmol/L (ref 3.5–5.1)
Sodium: 145 mmol/L (ref 135–145)

## 2019-01-18 LAB — MRSA PCR SCREENING: MRSA BY PCR: NEGATIVE

## 2019-01-18 LAB — CBC
HCT: 25.2 % — ABNORMAL LOW (ref 36.0–46.0)
Hemoglobin: 7.8 g/dL — ABNORMAL LOW (ref 12.0–15.0)
MCH: 29.9 pg (ref 26.0–34.0)
MCHC: 31 g/dL (ref 30.0–36.0)
MCV: 96.6 fL (ref 80.0–100.0)
Platelets: 191 10*3/uL (ref 150–400)
RBC: 2.61 MIL/uL — AB (ref 3.87–5.11)
RDW: 14.9 % (ref 11.5–15.5)
WBC: 9.5 10*3/uL (ref 4.0–10.5)
nRBC: 0 % (ref 0.0–0.2)

## 2019-01-18 LAB — HEMOGLOBIN AND HEMATOCRIT, BLOOD
HCT: 23.5 % — ABNORMAL LOW (ref 36.0–46.0)
Hemoglobin: 7.4 g/dL — ABNORMAL LOW (ref 12.0–15.0)

## 2019-01-18 MED ORDER — PANTOPRAZOLE SODIUM 40 MG PO TBEC
40.0000 mg | DELAYED_RELEASE_TABLET | Freq: Two times a day (BID) | ORAL | Status: DC
  Filled 2019-01-18: qty 1

## 2019-01-18 MED ORDER — SODIUM CHLORIDE 0.9% FLUSH
3.0000 mL | Freq: Two times a day (BID) | INTRAVENOUS | Status: DC
  Administered 2019-01-20 – 2019-01-22 (×4): 3 mL via INTRAVENOUS

## 2019-01-18 MED ORDER — SODIUM CHLORIDE 0.9% FLUSH: 3.0000 mL | INTRAVENOUS | Status: DC | PRN

## 2019-01-18 MED ORDER — SODIUM CHLORIDE 0.9 % IV SOLN: INTRAVENOUS | Status: DC

## 2019-01-18 MED ORDER — SODIUM CHLORIDE 0.9 % IV SOLN
INTRAVENOUS | Status: DC
  Administered 2019-01-18 – 2019-01-20 (×4): via INTRAVENOUS

## 2019-01-18 MED ORDER — PANTOPRAZOLE SODIUM 40 MG PO PACK
40.0000 mg | PACK | Freq: Two times a day (BID) | ORAL | Status: DC
  Administered 2019-01-18: 40 mg
  Filled 2019-01-18 (×5): qty 20

## 2019-01-18 MED ORDER — CEPHALEXIN 500 MG PO CAPS
500.0000 mg | ORAL_CAPSULE | Freq: Three times a day (TID) | ORAL | Status: DC
  Administered 2019-01-18 – 2019-01-19 (×3): 500 mg via ORAL
  Filled 2019-01-18 (×3): qty 1

## 2019-01-18 NOTE — Progress Notes (Signed)
Patient is unable to drink with a straw.  She is unable to draw up the fluid while using a small straw.  She refused to drink water from a cup.

## 2019-01-18 NOTE — Clinical Social Work Note (Addendum)
CSW contacted Florentina Jenny at [facility] [phone] she said patient is in the memory care unit.  Florentina Jenny informed CSW to contact Pope in memory care, [phone].  CSW attempted to contact Jenny Reichmann unable to leave a message, will try to call her later.  4:24pm  CSW attempted to contact patient's son Ron to discuss what patient's base line is, CSW left a message awaiting for a call back.  Jones Broom. Brogen Duell, MSW, [place]  01/18/2019 1:45 PM

## 2019-01-18 NOTE — Progress Notes (Signed)
[facility] at [place] NAME: Jennifer Klein    MR#:  [id number]  DATE OF BIRTH:  1941/05/18  SUBJECTIVE:  CHIEF COMPLAINT:   Chief Complaint  Patient presents with  . Epistaxis  Patient seen today Has left nasal packing No new episodes of epistaxis Has poor oral intake Needs assistance with feeding  REVIEW OF SYSTEMS:    ROS Unable to obtain secondary to dementia  DRUG ALLERGIES:   Allergies  Allergen Reactions  . Darvon [Propoxyphene Hcl]     As a child    VITALS:  Blood pressure 118/62, pulse 81, temperature 98.7 F (37.1 C), temperature source Oral, resp. rate 16, height 5\' 5"  (1.651 m), weight 72.6 kg, SpO2 99 %.  PHYSICAL EXAMINATION:   Physical Exam  GENERAL:  78 y.o.-year-old patient lying in the bed in no acute distress EYES: Pupils equal, round, reactive to light and accommodation. No scleral icterus. Extraocular muscles intact.  HEENT: Head atraumatic, normocephalic. Oropharynx clear Nasal packing intact NECK:  Supple, no jugular venous distention. No thyroid enlargement, no tenderness.  LUNGS: Normal breath sounds bilaterally, no wheezing, rales, rhonchi. No use of accessory muscles of respiration.  CARDIOVASCULAR: S1, S2 normal. No murmurs, rubs, or gallops.  ABDOMEN: Soft, nontender, nondistended. Bowel sounds present. No organomegaly or mass.  EXTREMITIES: No cyanosis, clubbing or edema b/l.    NEUROLOGIC: Cranial nerves II through XII are intact. No focal Motor or sensory deficits b/l.   PSYCHIATRIC: could not be assessed SKIN: No obvious rash, lesion, or ulcer.   LABORATORY PANEL:   CBC Recent Labs  Lab 01/18/19 0336  WBC 9.5  HGB 7.8*  HCT 25.2*  PLT 191   ------------------------------------------------------------------------------------------------------------------ Chemistries  Recent Labs  Lab 01/18/19 0336  NA 145  K 3.7  CL 116*  CO2 26  GLUCOSE 118*  BUN 23  CREATININE 0.83   CALCIUM 8.1*   ------------------------------------------------------------------------------------------------------------------  Cardiac Enzymes No results for input(s): TROPONINI in the last 168 hours. ------------------------------------------------------------------------------------------------------------------  RADIOLOGY:  No results found.   ASSESSMENT AND PLAN:  78 year old female patient with history of chronic atrial fibrillation on Xarelto, advanced dementia, B12 deficiency, hyperlipidemia had a mechanical fall at assisted living facility and has a nasal septal fracture on 01/08/2019.  -Epistaxis left nose improved Anticoagulation with Xarelto stopped  nasal septal fractures secondary to fall in mid January Nasal packing done in the emergency room Case has been discussed with ENT  They recommended nasal packing to continue till Monday Started on oral Keflex antibiotics  -Anemia GI loss versus anemia of chronic disease Of Xarelto Follow-up hemoglobin hematocrit If it drops less than 7 PRBC transfusion Watch for any new episodes of melanotic stools or coffee-ground emesis Start proton pump inhibitor orally  -Advanced dementia Supportive care  -Dehydration IV fluids  -Nausea and vomiting improved Antiemetics and IV fluids  -DVT prophylaxis sequential compression device to lower extremities  All the records are reviewed and case discussed with Care Management/Social Worker. Management plans discussed with the patient, family and they are in agreement.  CODE STATUS: DNR  DVT Prophylaxis: SCDs  TOTAL TIME TAKING CARE OF THIS PATIENT: 38 minutes.   POSSIBLE D/C IN 2 to 3 DAYS, DEPENDING ON CLINICAL CONDITION.  Saundra Shelling M.D on 01/18/2019 at 11:45 AM  Between 7am to 6pm - Pager - [phone]  After 6pm go to [email or web address] - password [email or web address] [place] [facility]  Office  [phone]  CC: Primary care physician; Arnetha Courser,  MD  Note: This dictation was prepared with Dragon dictation along with smaller phrase technology. Any transcriptional errors that result from this process are unintentional.

## 2019-01-18 NOTE — Plan of Care (Signed)
She shows little signs of wanting to, or being able to move herself within the bed.  Turned Q2H.

## 2019-01-19 DIAGNOSIS — R040 Epistaxis: Principal | ICD-10-CM

## 2019-01-19 DIAGNOSIS — D62 Acute posthemorrhagic anemia: Secondary | ICD-10-CM

## 2019-01-19 LAB — CBC
HCT: 24.2 % — ABNORMAL LOW (ref 36.0–46.0)
Hemoglobin: 7.7 g/dL — ABNORMAL LOW (ref 12.0–15.0)
MCH: 30.3 pg (ref 26.0–34.0)
MCHC: 31.8 g/dL (ref 30.0–36.0)
MCV: 95.3 fL (ref 80.0–100.0)
Platelets: 179 10*3/uL (ref 150–400)
RBC: 2.54 MIL/uL — ABNORMAL LOW (ref 3.87–5.11)
RDW: 14.6 % (ref 11.5–15.5)
WBC: 7.6 10*3/uL (ref 4.0–10.5)
nRBC: 0 % (ref 0.0–0.2)

## 2019-01-19 LAB — BASIC METABOLIC PANEL
Anion gap: 3 — ABNORMAL LOW (ref 5–15)
BUN: 15 mg/dL (ref 8–23)
CO2: 25 mmol/L (ref 22–32)
Calcium: 7.7 mg/dL — ABNORMAL LOW (ref 8.9–10.3)
Chloride: 115 mmol/L — ABNORMAL HIGH (ref 98–111)
Creatinine, Ser: 0.76 mg/dL (ref 0.44–1.00)
GFR calc Af Amer: >60 mL/min (ref >60)
GFR calc non Af Amer: >60 mL/min (ref >60)
Glucose, Bld: 107 mg/dL — ABNORMAL HIGH (ref 70–99)
Potassium: 3.4 mmol/L — ABNORMAL LOW (ref 3.5–5.1)
Sodium: 143 mmol/L (ref 135–145)

## 2019-01-19 MED ORDER — PANTOPRAZOLE SODIUM 40 MG PO TBEC
40.0000 mg | DELAYED_RELEASE_TABLET | Freq: Every day | ORAL | Status: DC
  Administered 2019-01-19 – 2019-01-24 (×6): 40 mg via ORAL
  Filled 2019-01-19 (×6): qty 1

## 2019-01-19 MED ORDER — POTASSIUM CHLORIDE CRYS ER 20 MEQ PO TBCR
40.0000 meq | EXTENDED_RELEASE_TABLET | Freq: Once | ORAL | Status: AC
  Administered 2019-01-19: 40 meq via ORAL
  Filled 2019-01-19: qty 2

## 2019-01-19 MED ORDER — POLYETHYLENE GLYCOL 3350 17 G PO PACK
17.0000 g | PACK | Freq: Every day | ORAL | Status: DC
  Administered 2019-01-20 – 2019-01-24 (×4): 17 g via ORAL
  Filled 2019-01-19 (×6): qty 1

## 2019-01-19 MED ORDER — DOCUSATE SODIUM 100 MG PO CAPS
100.0000 mg | ORAL_CAPSULE | Freq: Two times a day (BID) | ORAL | Status: DC
  Administered 2019-01-19 – 2019-01-24 (×11): 100 mg via ORAL
  Filled 2019-01-19 (×11): qty 1

## 2019-01-19 MED ORDER — CEPHALEXIN 500 MG PO CAPS
500.0000 mg | ORAL_CAPSULE | Freq: Three times a day (TID) | ORAL | Status: AC
  Administered 2019-01-19 – 2019-01-23 (×13): 500 mg via ORAL
  Filled 2019-01-19 (×13): qty 1

## 2019-01-19 NOTE — Plan of Care (Signed)
  Problem: Clinical Measurements: Goal: Complications related to the disease process, condition or treatment will be avoided or minimized Outcome: Progressing   Problem: Clinical Measurements: Goal: Diagnostic test results will improve Outcome: Progressing   Problem: Elimination: Goal: Will not experience complications related to urinary retention Outcome: Progressing

## 2019-01-19 NOTE — Progress Notes (Addendum)
[facility] at [place] NAME: Jennifer Klein    MR#:  [id number]  DATE OF BIRTH:  10/12/1941  SUBJECTIVE:  CHIEF COMPLAINT:   Chief Complaint  Patient presents with  . Epistaxis  Patient seen today Has left nasal packing which will be continued till Monday No new episodes of epistaxis Needs assistance with feeding Hemoglobin 7.7  REVIEW OF SYSTEMS:    ROS Unable to obtain secondary to dementia  DRUG ALLERGIES:   Allergies  Allergen Reactions  . Darvon [Propoxyphene Hcl]     As a child    VITALS:  Blood pressure (!) 109/57, pulse 71, temperature 98.4 F (36.9 C), temperature source Oral, resp. rate 19, height 5\' 5"  (1.651 m), weight 72.6 kg, SpO2 97 %.  PHYSICAL EXAMINATION:   Physical Exam  GENERAL:  78 y.o.-year-old patient lying in the bed in no acute distress EYES: Pupils equal, round, reactive to light and accommodation. No scleral icterus. Extraocular muscles intact.  HEENT: Head atraumatic, normocephalic. Oropharynx clear Nasal packing intact NECK:  Supple, no jugular venous distention. No thyroid enlargement, no tenderness.  LUNGS: Normal breath sounds bilaterally, no wheezing, rales, rhonchi. No use of accessory muscles of respiration.  CARDIOVASCULAR: S1, S2 normal. No murmurs, rubs, or gallops.  ABDOMEN: Soft, nontender, nondistended. Bowel sounds present. No organomegaly or mass.  EXTREMITIES: No cyanosis, clubbing or edema b/l.    NEUROLOGIC: Cranial nerves II through XII are intact. No focal Motor or sensory deficits b/l.   PSYCHIATRIC: could not be assessed SKIN: No obvious rash, lesion, or ulcer.   LABORATORY PANEL:   CBC Recent Labs  Lab 01/19/19 0955  WBC 7.6  HGB 7.7*  HCT 24.2*  PLT 179   ------------------------------------------------------------------------------------------------------------------ Chemistries  Recent Labs  Lab 01/19/19 0955  NA 143  K 3.4*  CL 115*  CO2 25  GLUCOSE 107*   BUN 15  CREATININE 0.76  CALCIUM 7.7*   ------------------------------------------------------------------------------------------------------------------  Cardiac Enzymes No results for input(s): TROPONINI in the last 168 hours. ------------------------------------------------------------------------------------------------------------------  RADIOLOGY:  No results found.   ASSESSMENT AND PLAN:  78 year old female patient with history of chronic atrial fibrillation on Xarelto, advanced dementia, B12 deficiency, hyperlipidemia had a mechanical fall at assisted living facility and has a nasal septal fracture on 01/08/2019.  -Epistaxis left nose improved Anticoagulation with Xarelto stopped  nasal septal fractures secondary to fall in mid January Nasal packing done in the emergency room Case has been discussed with ENT  They recommended nasal packing to continue till Monday Started on oral Keflex antibiotics  -Anemia Probably from epistaxis GI loss unlikely Of Xarelto Follow-up hemoglobin hematocrit If it drops less than 7 PRBC transfusion Watch for any new episodes of melanotic stools or coffee-ground emesis Start proton pump inhibitor orally Gastroenterology evaluation for any further intervention  -Advanced dementia Supportive care  -Dehydration IV fluids  -Nausea and vomiting improved Antiemetics and IV fluids  -DVT prophylaxis sequential compression device to lower extremities  -Ambulatory dysfunction PT evaluation  -Acute hypokalemia Replace potassium  All the records are reviewed and case discussed with Care Management/Social Worker. Management plans discussed with the patient, family and they are in agreement.  CODE STATUS: DNR  DVT Prophylaxis: SCDs  TOTAL TIME TAKING CARE OF THIS PATIENT: 38 minutes.   POSSIBLE D/C IN 2 to 3 DAYS, DEPENDING ON CLINICAL CONDITION.  Saundra Shelling M.D on 01/19/2019 at 1:48 PM  Between 7am to 6pm - Pager -  [phone]  After 6pm go to  [email or web address] - password [email or web address] [facility]  Office  [phone]  CC: Primary care physician; Arnetha Courser, MD  Note: This dictation was prepared with Dragon dictation along with smaller phrase technology. Any transcriptional errors that result from this process are unintentional.   Gastroenterology has discussed with patients family They do not want any aggressive measures such endoscopy and colonoscopy

## 2019-01-19 NOTE — Clinical Social Work Note (Signed)
Clinical Social Work Assessment  Patient Details  Name: Jennifer Klein MRN: [id number] Date of Birth: [birth year]  Date of referral:  01/18/19               Reason for consult:  Facility Placement                Permission sought to share information with:  Facility Sport and exercise psychologist, Family Supports Permission granted to share information::  Yes, Verbal Permission Granted  Name::     [name] [phone] or Ericka Pontiff   [phone] or [name], [name] Significant other   [phone]  Agency::  ALF and SNF admissions  Relationship::     Contact Information:     Housing/Transportation Living arrangements for the past 2 months:  Assisted Living Facility([facility] memory care) Source of Information:  Medical Team Patient Interpreter Needed:  None Criminal Activity/Legal Involvement Pertinent to Current Situation/Hospitalization:  No - Comment as needed Significant Relationships:  Adult Children, Spouse Lives with:  Facility Resident Do you feel safe going back to the place where you live?  Yes Need for family participation in patient care:  Yes (Comment)  Care giving concerns:  Patient's husband would like her to return back to [facility] ALF memory care.   Social Worker assessment / plan:  Patient is a 78 year old female who is alert and oriented x1.  Patient is married and lives at [facility].  Due to patient's confusion CSW spoke to her husband Jennifer Klein to complete assessment.  Patient has been at [facility] since 2018, she has been living in the memory care side of ALF.  Patient's husband states that normally patient is walking around ALF, but she has several falls.  Patient's husband states that she gets dehydrated often, and he was working with ALF to try to increase fluid consumption.  Patient's husband reports that she was at [facility] within the last year, and he does not feel that she would benefit from SNF  placement, but he does feel she would benefit from home health.  Patient's husband was explained role of CSW and process for coordinating placement.  Patient's husband did not have any other questions or concerns.  Employment status:  Retired Forensic scientist:  Medicare PT Recommendations:  Not assessed at this time Information / Referral to community resources:     Patient/Family's Response to care:  Patient's husband would like her to return back to memory care ALF at [facility].  Patient/Family's Understanding of and Emotional Response to Diagnosis, Current Treatment, and Prognosis:  Patient's husband is hopeful that patient will be able to return to ALF on Monday.  Emotional Assessment Appearance:  Appears stated age Attitude/Demeanor/Rapport:    Affect (typically observed):  Calm, Quiet Orientation:  Oriented to Self Alcohol / Substance use:  Not Applicable Psych involvement (Current and /or in the community):  No (Comment)  Discharge Needs  Concerns to be addressed:  Care Coordination, Cognitive Concerns, Lack of Support Readmission within the last 30 days:  No Current discharge risk:  Cognitively Impaired, Lack of support system Barriers to Discharge:  Continued Medical Work up   Jennifer Klein 01/19/2019, 5:37 PM

## 2019-01-19 NOTE — Consult Note (Signed)
Jennifer Lame, MD [facility]  [address] [place], [place] [address] Phone: [phone] Fax : [phone]  Consultation  Referring Provider:     Dr. Estanislado Pandy Primary Care Physician:  Arnetha Courser, MD Primary Gastroenterologist:  Dr. Allen Norris Reason for Consultation:     GI bleeding  Date of Admission:  01/16/2019 Date of Consultation:  01/19/2019         HPI:   Jennifer Klein is a 78 y.o. female Who has a history of normal hemoglobins for many years with a history of advanced dementia.  The patient was admitted with epistaxis and anemia.  The patient was also reported to have some dark stools thought to be blood.  The patient had nasal packing of the left naris.  The patient has had no further bleeding. The patient's last colonoscopy appears to be around 2008 and it appears that I had done it. At the time of admission the patient's hemoglobin on January 27 was 12.3 that trended down to 7.4 yesterday.  Today the patient's hemoglobin is 7.7.  There is no sign of any further nasal bleeding.  The patient is pleasantly demented and unable to give any history.  Past Medical History:  Diagnosis Date  . Alzheimer's dementia with behavioral disturbance ([place]) 05/04/2016  . Alzheimer's dementia without behavioral disturbance ([place]) 05/04/2016  . B12 deficiency 05/06/2016  . Gout   . Hemorrhoids   . Hyperlipidemia LDL goal <100 05/05/2016  . Hypertension   . Memory loss   . PAF (paroxysmal atrial fibrillation) ([place])    a. on Xarelto; b. 48-hr Holter 2014: NSR with PAF/flutter which happened at least 2.26% of the time w/ associated tachycardia. Patient was asymptomatic; c. CHADS2VASc => 4 (HTN, age x 2, female)  . Paroxysmal atrial fibrillation ([place]) 08/07/2013  . Senile osteoporosis   . Systolic dysfunction    a. echo 08/2013: EF 50-55%, mild MR, mildly dilated LA    Past Surgical History:  Procedure Laterality Date  . TUBAL LIGATION      Prior to Admission medications   Medication Sig  Start Date End Date Taking? Authorizing Provider  acetaminophen (TYLENOL) 500 MG tablet Take 500 mg by mouth every 6 (six) hours as needed.   Yes [provider]  allopurinol (ZYLOPRIM) 100 MG tablet Take 1 tablet (100 mg total) by mouth daily. 10/27/18  Yes Lada, Satira Anis, MD  alum & mag hydroxide-simeth (MI-ACID) 200-200-20 MG/5ML suspension Take 30 mLs by mouth every 6 (six) hours as needed for indigestion or heartburn.   Yes [provider]  amoxicillin-clavulanate (AUGMENTIN) 875-125 MG tablet Take 1 tablet by mouth every 12 (twelve) hours. 01/10/19 01/19/19 Yes [provider]  atorvastatin (LIPITOR) 20 MG tablet TAKE 1 TABLET BY MOUTH AT BEDTIME Patient taking differently: Take 20 mg by mouth daily.  06/07/18  Yes Lada, Satira Anis, MD  busPIRone (BUSPAR) 5 MG tablet TAKE 1 TABLET BY MOUTH 2 TIMES DAILY FORDEPRESSION Patient taking differently: Take 5 mg by mouth 2 (two) times daily.  05/30/18  Yes Lada, Satira Anis, MD  cholecalciferol (VITAMIN D) 1000 units tablet TAKE 1 TABLET BY MOUTH ONCE DAILY Patient taking differently: Take 1,000 Units by mouth daily.  04/13/18  Yes Lada, Satira Anis, MD  donepezil (ARICEPT) 10 MG tablet TAKE 1 TABLET BY MOUTH BEDTIME Patient taking differently: Take 10 mg by mouth at bedtime. TAKE 1 TABLET BY MOUTH BEDTIME 10/27/18  Yes Lada, Satira Anis, MD  guaifenesin (ROBITUSSIN) 100 MG/5ML syrup  Take 200 mg by mouth every 6 (six) hours as needed for cough.    Yes [provider]  loperamide (IMODIUM) 2 MG capsule Take 2 mg by mouth as needed for diarrhea or loose stools.   Yes [provider]  LORazepam (ATIVAN) 0.5 MG tablet Take 0.5 tablets (0.25 mg total) by mouth 2 (two) times daily. 11/09/18  Yes Lada, Satira Anis, MD  magnesium hydroxide (MILK OF MAGNESIA) 400 MG/5ML suspension Take 30 mLs by mouth daily as needed for mild constipation.   Yes [provider]  memantine (NAMENDA) 10 MG tablet Take 1 tablet (10 mg total)  by mouth 2 (two) times daily. 10/27/18  Yes Lada, Satira Anis, MD  metoprolol tartrate (LOPRESSOR) 25 MG tablet Take 1 tablet (25 mg total) by mouth 2 (two) times daily. 11/04/18  Yes Wellington Hampshire, MD  neomycin-bacitracin-polymyxin (NEOSPORIN) [phone] ointment Apply topically 4 (four) times daily as needed.    Yes [provider]  pantoprazole (PROTONIX) 40 MG tablet Take 1 tablet (40 mg total) by mouth daily. 10/27/18  Yes Lada, Satira Anis, MD  Rivaroxaban (XARELTO) 15 MG TABS tablet TAKE 1 TABLET BY MOUTH DAILY WITH SUPPER Patient taking differently: Take 15 mg by mouth daily with supper. TAKE 1 TABLET BY MOUTH DAILY WITH SUPPER 10/06/18  Yes Lada, Satira Anis, MD  vitamin B-12 (CYANOCOBALAMIN) 500 MCG tablet Take 500 mcg by mouth daily.   Yes [provider]    Family History  Problem Relation Age of Onset  . Heart disease Father   . Gout Brother      Social History   Tobacco Use  . Smoking status: Former Smoker    Packs/day: 0.25    Years: 1.00    Pack years: 0.25    Types: Cigarettes  . Smokeless tobacco: Never Used  Substance Use Topics  . Alcohol use: No    Alcohol/week: 0.0 standard drinks  . Drug use: No    Allergies as of 01/16/2019 - Review Complete 01/16/2019  Allergen Reaction Noted  . Darvon [propoxyphene hcl]  06/03/2012    Review of Systems:    All systems reviewed and negative except where noted in HPI.   Physical Exam:  Vital signs in last 24 hours: Temp:  [98.4 F (36.9 C)-98.6 F (37 C)] 98.5 F (36.9 C) (01/30 1544) Pulse Rate:  [71-95] 71 (01/30 1544) Resp:  [19] 19 (01/30 1544) BP: (95-135)/(51-64) 105/55 (01/30 1544) SpO2:  [97 %-100 %] 99 % (01/30 1544) Last BM Date: 01/16/19 General:   Pleasant, cooperative in NAD Head:  Normocephalic and atraumatic. Eyes:   No icterus.   Conjunctiva pink. PERRLA. Ears:  Normal auditory acuity. Neck:  Supple; no masses or thyroidomegaly Lungs: Respirations even and unlabored. Lungs  clear to auscultation bilaterally.   No wheezes, crackles, or rhonchi.  Heart:  Regular rate and rhythm;  Without murmur, clicks, rubs or gallops Abdomen:  Soft, nondistended, nontender. Normal bowel sounds. No appreciable masses or hepatomegaly.  No rebound or guarding.  Rectal:  Not performed. Msk:  Symmetrical without gross deformities.    Extremities:  Without edema, cyanosis or clubbing. Neurologic:  Alert And confused;  grossly normal neurologically, Patient unable to perform tasks that are asked of her. . Skin:  Intact without significant lesions or rashes. Cervical Nodes:  No significant cervical adenopathy. Psych:  Alert and cooperative. Pleasantly demented.  LAB RESULTS: Recent Labs    01/17/19 0052  01/18/19 0336 01/18/19 1643 01/19/19 0955  WBC 11.4*  --  9.5  --  7.6  HGB 9.5*   < > 7.8* 7.4* 7.7*  HCT 30.9*   < > 25.2* 23.5* 24.2*  PLT 242  --  191  --  179   < > = values in this interval not displayed.   BMET Recent Labs    01/17/19 0052 01/18/19 0336 01/19/19 0955  NA 147* 145 143  K 3.8 3.7 3.4*  CL 119* 116* 115*  CO2 23 26 25   GLUCOSE 107* 118* 107*  BUN 38* 23 15  CREATININE 0.90 0.83 0.76  CALCIUM 8.1* 8.1* 7.7*   LFT No results for input(s): PROT, ALBUMIN, AST, ALT, ALKPHOS, BILITOT, BILIDIR, IBILI in the last 72 hours. PT/INR No results for input(s): LABPROT, INR in the last 72 hours.  STUDIES: No results found.    Impression / Plan:   Assessment: Active Problems:   Epistaxis   Jennifer Klein is a 78 y.o. y/o female with epistaxis and a stable hemoglobin for sometime prior to admission.  Since having the epistaxis the patient had dropped her hemoglobin significantly.  It is unlikely that the patient had a GI bleed at the same time she had a nosebleed and the blood in her stool is likely from swallowed blood.  I have spoken to the husband who does not want to pursue any invasive procedures on the patient.  Plan: This patient has a drop  in hemoglobin on anticoagulation which has subsided.  The patient's hemoglobin was 7.7 today.  The patient has been has been given the option of having the patient undergo a GI workup.  It is more likely that the patient had a nosebleed as the cause of her bleeding with a normal hemoglobin at admission.  It is unlikely that the patient had a GI bleed at the exact same time she had epistaxis causing her anemia.  The husband has refused any GI intervention and I do not disagree with him.  I would continue to treat this patient conservatively.  I will sign off.  Please call if any further GI concerns or questions.  We would like to thank you for the opportunity to participate in the care of Jennifer Klein.    Thank you for involving me in the care of this patient.      LOS: 3 days   Jennifer Lame, MD  01/19/2019, 4:29 PM    Note: This dictation was prepared with Dragon dictation along with smaller phrase technology. Any transcriptional errors that result from this process are unintentional.

## 2019-01-19 NOTE — Care Management Important Message (Signed)
Patient unable to sign initial Medicare IM.  No family available.  Copy left in patient's room.

## 2019-01-20 LAB — POTASSIUM: Potassium: 3.4 mmol/L — ABNORMAL LOW (ref 3.5–5.1)

## 2019-01-20 LAB — HEMOGLOBIN AND HEMATOCRIT, BLOOD
HCT: 22.3 % — ABNORMAL LOW (ref 36.0–46.0)
Hemoglobin: 7.2 g/dL — ABNORMAL LOW (ref 12.0–15.0)

## 2019-01-20 MED ORDER — FLEET ENEMA 7-19 GM/118ML RE ENEM
1.0000 | ENEMA | Freq: Every day | RECTAL | Status: DC | PRN
  Administered 2019-01-20: 1 via RECTAL
  Filled 2019-01-20: qty 1

## 2019-01-20 MED ORDER — POTASSIUM CHLORIDE CRYS ER 20 MEQ PO TBCR
40.0000 meq | EXTENDED_RELEASE_TABLET | Freq: Once | ORAL | Status: AC
  Administered 2019-01-20: 40 meq via ORAL
  Filled 2019-01-20: qty 2

## 2019-01-20 NOTE — Progress Notes (Signed)
Patient accidentally pulled out IV. Dr. Estanislado Pandy at bedside. Okay for patient to not have IV at this time as she is not receiving anything, fluids are discontinued, and she is waiting for PT and potential discharge plans.

## 2019-01-20 NOTE — Plan of Care (Signed)
  Problem: Fluid Volume: Goal: Will show no signs and symptoms of excessive bleeding Outcome: Progressing   Problem: Safety: Goal: Ability to remain free from injury will improve Outcome: Progressing   

## 2019-01-20 NOTE — Evaluation (Signed)
Physical Therapy Evaluation Patient Details Name: Jennifer Klein MRN: [id number] DOB: 1941/12/08 Today's Date: 01/20/2019   History of Present Illness  presented to ER from ALF due to nosebleed, intractable vomiting (with coffee ground emesis) in the setting of chronic anticoagulation.  L nares packed; current HgB 7.2.  Of note, patient with recent fall (per chart) with nasal septum fracture (01/08/2019)  Clinical Impression  Upon evaluation, patient alert but oriented to self only; generally motor restless, somewhat anxious, constantly reaching for and manipulating clothing, gait belt, linens throughout session.  Bilat UE/LE strength and ROM grossly WFL for basic transfers and gait (at least 4-/5) without significant ROM loss/contracture or focal weakness noted.  Does require hand-over-hand assist for movement of all extremities. Unable to follow verbal commands for purposeful movement of any type.  Currently requiring mod/max assist for bed mobility; mod assist +1-2 for sit/stand, basic transfers and gait (75') with bilat HHA.  Very short, shuffling (almost festinating) gait performance, requiring hand-over-hand assist to guide movement/pathway, mod manual cuing for bilat ant/lateral weight shift throughout gait cycle.  Poor balance.  Unsafe to attempt without +2 at this time. Would benefit from skilled PT to address above deficits and promote optimal return to PLOF; Recommend transition to [facility] upon discharge from acute hospitalization.  Question ability to participate/progress with intensive rehab program required at [facility]; additionally, feel patient to benefit most optimally from rehab in normal, familiar setting.     Follow Up Recommendations Home health PT;Supervision/Assistance - 24 hour    Equipment Recommendations       Recommendations for Other Services       Precautions / Restrictions Precautions Precautions: Fall Restrictions Weight Bearing Restrictions: No      Mobility  Bed  Mobility Overal bed mobility: Needs Assistance Bed Mobility: Supine to Sit;Sit to Supine     Supine to sit: Mod assist;Max assist Sit to supine: Max assist   General bed mobility comments: hand-over-hand assist for UE, LEs and trunk  Transfers Overall transfer level: Needs assistance   Transfers: Sit to/from Stand Sit to Stand: Mod assist;+2 safety/equipment         General transfer comment: hand-over-hand and manual cuing at trunk to facilitate; assist for foot placement, lift off and balance. Generally fearful of movement transition  Ambulation/Gait Ambulation/Gait assistance: Mod assist;+2 physical assistance Gait Distance (Feet): 75 Feet Assistive device: 2 person hand held assist       General Gait Details: very short, shuffling (almost festinating) gait performance, requiring hand-over-hand assist to guide movement/pathway, mod manual cuing for bilat ant/lateral weight shift throughout gait cycle.  Poor balance.  Unsafe to attempt withotu +2 at this time  Stairs            Wheelchair Mobility    Modified Rankin (Stroke Patients Only)       Balance Overall balance assessment: Needs assistance Sitting-balance support: No upper extremity supported;Feet supported Sitting balance-Leahy Scale: Fair     Standing balance support: Bilateral upper extremity supported Standing balance-Leahy Scale: Poor Standing balance comment: poor balance reactions in all planes                             Pertinent Vitals/Pain Pain Assessment: Faces Faces Pain Scale: No hurt    Home Living                   Additional Comments: Resident of [facility].  Patient able to provide additional  information.    Prior Function Level of Independence: Needs assistance         Comments: Patient unable to provide full history or PLOF.  Per CSW (who spoke with husband), patient ambulatory at facility (though unsure of device, level of assist).  Per  previous notes 7 months prior, patient ambulatory without assist device and able to feed self.     Hand Dominance        Extremity/Trunk Assessment   Upper Extremity Assessment Upper Extremity Assessment: Generalized weakness(grossly at least 4-/5, act assist required)    Lower Extremity Assessment Lower Extremity Assessment: Generalized weakness(grossly at least 4-/5, hand-over-hand required for purposeful/targeted movement)       Communication   Communication: (unable to maintain conversation or answer specific questions)  Cognition Arousal/Alertness: Awake/alert Behavior During Therapy: Restless Overall Cognitive Status: No family/caregiver present to determine baseline cognitive functioning                                 General Comments: oriented to self only; very motor restless, constantly reaching/grabbing for clothing, gait belt; requires hand-over-hand for initiation and completion of all functional activities      General Comments      Exercises Other Exercises Other Exercises: Sit/stand x3 with HHA, mod assist-increased time/effort for anterior weight translation and standing balance; often attempting spontaneous sitting (despite cuing) with static stance efforts   Assessment/Plan    PT Assessment Patient needs continued PT services  PT Problem List Decreased strength;Decreased range of motion;Decreased activity tolerance;Decreased balance;Decreased mobility;Decreased coordination;Decreased cognition;Decreased knowledge of use of DME;Decreased safety awareness;Decreased knowledge of precautions       PT Treatment Interventions DME instruction;Gait training;Functional mobility training;Therapeutic activities;Therapeutic exercise;Balance training;Cognitive remediation;Patient/family education    PT Goals (Current goals can be found in the Care Plan section)  Acute Rehab PT Goals PT Goal Formulation: Patient unable to participate in goal  setting Time For Goal Achievement: 02/03/19 Potential to Achieve Goals: Fair    Frequency Min 2X/week   Barriers to discharge Decreased caregiver support      Co-evaluation               AM-PAC PT "6 Clicks" Mobility  Outcome Measure Help needed turning from your back to your side while in a flat bed without using bedrails?: A Little Help needed moving from lying on your back to sitting on the side of a flat bed without using bedrails?: A Lot Help needed moving to and from a bed to a chair (including a wheelchair)?: A Lot Help needed standing up from a chair using your arms (e.g., wheelchair or bedside chair)?: A Lot Help needed to walk in hospital room?: A Lot Help needed climbing 3-5 steps with a railing? : Total 6 Click Score: 12    End of Session Equipment Utilized During Treatment: Gait belt Activity Tolerance: Patient tolerated treatment well Patient left: in bed;with call bell/phone within reach;with bed alarm set(per RN, mittens not required as patient no longer with IV/lines in place) Nurse Communication: Mobility status PT Visit Diagnosis: Muscle weakness (generalized) (M62.81);Difficulty in walking, not elsewhere classified (R26.2)    Time: 1501-1520 PT Time Calculation (min) (ACUTE ONLY): 19 min   Charges:   PT Evaluation $PT Eval Moderate Complexity: 1 Mod PT Treatments $Therapeutic Activity: 8-22 mins        Lakira Ogando H. Owens Shark, PT, DPT, NCS 01/20/19, 3:56 PM [phone]

## 2019-01-20 NOTE — Progress Notes (Signed)
[place] [facility] [place] NAME: Jennifer Klein    MR#:  [id number]  DATE OF BIRTH:  04/06/1941  SUBJECTIVE:  CHIEF COMPLAINT:   Chief Complaint  Patient presents with  . Epistaxis  Patient seen today Has left nasal packing which will be continued till Monday No new episodes of epistaxis Needs assistance with feeding Hemoglobin 7.2  REVIEW OF SYSTEMS:    ROS Unable to obtain secondary to dementia  DRUG ALLERGIES:   Allergies  Allergen Reactions  . Darvon [Propoxyphene Hcl]     As a child    VITALS:  Blood pressure 136/87, pulse 98, temperature 98.4 F (36.9 C), temperature source Oral, resp. rate 19, height 5\' 5"  (1.651 m), weight 72.6 kg, SpO2 90 %.  PHYSICAL EXAMINATION:   Physical Exam  GENERAL:  78 y.o.-year-old patient lying in the bed in no acute distress EYES: Pupils equal, round, reactive to light and accommodation. No scleral icterus. Extraocular muscles intact.  HEENT: Head atraumatic, normocephalic. Oropharynx clear Nasal packing intact NECK:  Supple, no jugular venous distention. No thyroid enlargement, no tenderness.  LUNGS: Normal breath sounds bilaterally, no wheezing, rales, rhonchi. No use of accessory muscles of respiration.  CARDIOVASCULAR: S1, S2 normal. No murmurs, rubs, or gallops.  ABDOMEN: Soft, nontender, nondistended. Bowel sounds present. No organomegaly or mass.  EXTREMITIES: No cyanosis, clubbing or edema b/l.    NEUROLOGIC: Cranial nerves II through XII are intact. No focal Motor or sensory deficits b/l.   PSYCHIATRIC: could not be assessed SKIN: No obvious rash, lesion, or ulcer.   LABORATORY PANEL:   CBC Recent Labs  Lab 01/19/19 0955 01/20/19 0526  WBC 7.6  --   HGB 7.7* 7.2*  HCT 24.2* 22.3*  PLT 179  --    ------------------------------------------------------------------------------------------------------------------ Chemistries  Recent Labs  Lab 01/19/19 0955 01/20/19 0526  NA  143  --   K 3.4* 3.4*  CL 115*  --   CO2 25  --   GLUCOSE 107*  --   BUN 15  --   CREATININE 0.76  --   CALCIUM 7.7*  --    ------------------------------------------------------------------------------------------------------------------  Cardiac Enzymes No results for input(s): TROPONINI in the last 168 hours. ------------------------------------------------------------------------------------------------------------------  RADIOLOGY:  No results found.   ASSESSMENT AND PLAN:  78 year old female patient with history of chronic atrial fibrillation on Xarelto, advanced dementia, B12 deficiency, hyperlipidemia had a mechanical fall at assisted living facility and has a nasal septal fracture on 01/08/2019.  -Epistaxis left nose improved Anticoagulation with Xarelto stopped  nasal septal fractures secondary to fall in mid January Nasal packing done in the emergency room Case has been discussed with ENT  They recommended nasal packing to continue till Monday Started on oral Keflex antibiotics for a total.  Of 7 days  -Anemia Probably from epistaxis GI loss unlikely Of Xarelto Follow-up hemoglobin hematocrit Blood transfusion if hemoglobin drops less than 7 Status post gastroenterology evaluation Gastroenterology has discussed with patient's family, patient family do not want any endoscopy and colonoscopy and aggressive measures  -Advanced dementia Supportive care  -Dehydration improved IV fluids will be discontinued  -Nausea and vomiting improved Antiemetics   -DVT prophylaxis sequential compression device to lower extremities  -Ambulatory dysfunction PT evaluation Will have to assess patient whether she needs to go to SNF   -Acute hypokalemia Replace potassium  -Constipation Stool softeners to continue PRN enema  All the records are reviewed and case discussed with Care Management/Social Worker. Management plans discussed with  the patient, family and they are  in agreement.  CODE STATUS: DNR  DVT Prophylaxis: SCDs  TOTAL TIME TAKING CARE OF THIS PATIENT: 35 minutes.   POSSIBLE D/C IN 2 to 3 DAYS, DEPENDING ON CLINICAL CONDITION.  Saundra Shelling M.D on 01/20/2019 at 12:58 PM  Between 7am to 6pm - Pager - [phone]  After 6pm go to [email or web address] - password [email or web address] [facility]  Office  [phone]  CC: Primary care physician; Arnetha Courser, MD  Note: This dictation was prepared with Dragon dictation along with smaller phrase technology. Any transcriptional errors that result from this process are unintentional.   Gastroenterology has discussed with patients family They do not want any aggressive measures such endoscopy and colonoscopy

## 2019-01-20 NOTE — Clinical Social Work Note (Addendum)
CSW was informed by PT that they are recommending home health PT, CSW updated case manager.  CSW to continue to follow patient's progress throughout discharge planning.  CSW updated [employer], and they said patient can return with home health once packing has been removed from patient's nose.  Jones Broom. Norval Morton, MSW, [place]  01/20/2019 6:13 PM

## 2019-01-21 LAB — HEMOGLOBIN AND HEMATOCRIT, BLOOD
HCT: 26.7 % — ABNORMAL LOW (ref 36.0–46.0)
Hemoglobin: 8.5 g/dL — ABNORMAL LOW (ref 12.0–15.0)

## 2019-01-21 LAB — POTASSIUM: POTASSIUM: 3.6 mmol/L (ref 3.5–5.1)

## 2019-01-21 MED ORDER — ORAL CARE MOUTH RINSE
15.0000 mL | Freq: Two times a day (BID) | OROMUCOSAL | Status: DC
  Administered 2019-01-21 – 2019-01-24 (×6): 15 mL via OROMUCOSAL

## 2019-01-21 NOTE — Progress Notes (Addendum)
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 Darvon [Propoxyphene Hcl]     As a child    VITALS:  Blood pressure 130/83, pulse 85, temperature 98.4 F (36.9 C), temperature source Oral, resp. rate 16, height 5\' 5"  (1.651 m), weight 74.4 kg, SpO2 98 %.  PHYSICAL EXAMINATION:   Physical Exam  GENERAL:  78 y.o.-year-old patient lying in the bed in no acute distress EYES: Pupils equal, round, reactive to light and accommodation. No scleral icterus. Extraocular muscles intact.  HEENT: Head atraumatic, normocephalic. Oropharynx clear Nasal packing intact NECK:  Supple, no jugular venous distention. No thyroid enlargement, no tenderness.  LUNGS: Normal breath sounds bilaterally, no wheezing, rales, rhonchi. No use of accessory muscles of respiration.  CARDIOVASCULAR: S1, S2 normal. No murmurs, rubs, or gallops.  ABDOMEN: Soft, nontender, nondistended. Bowel sounds present. No organomegaly or mass.  EXTREMITIES: No cyanosis, clubbing or edema b/l.    NEUROLOGIC: Cranial nerves II through XII are intact. No focal Motor or sensory deficits b/l.   PSYCHIATRIC: could not be assessed SKIN: No obvious rash, lesion, or ulcer.   LABORATORY PANEL:   CBC Recent Labs  Lab 01/19/19 0955  01/21/19 0450  WBC 7.6  --   --   HGB 7.7*   < >  8.5*  HCT 24.2*   < > 26.7*  PLT 179  --   --    < > = values in this interval not displayed.   ------------------------------------------------------------------------------------------------------------------ Chemistries  Recent Labs  Lab 01/19/19 0955  01/21/19 0450  NA 143  --   --   K 3.4*   < > 3.6  CL 115*  --   --   CO2 25  --   --   GLUCOSE 107*  --   --   BUN 15  --   --   CREATININE 0.76  --   --   CALCIUM 7.7*  --   --    < > = values in this interval not displayed.   ------------------------------------------------------------------------------------------------------------------  Cardiac Enzymes No results for input(s): TROPONINI in the last 168 hours. ------------------------------------------------------------------------------------------------------------------  RADIOLOGY:  No results found.   ASSESSMENT AND PLAN:  78 year old female patient with history of chronic atrial fibrillation on Xarelto, advanced dementia, B12 deficiency, hyperlipidemia had a mechanical fall at assisted living facility and has a nasal septal fracture on 01/08/2019.  *Acute epistaxis left nose Resolved Anticoagulation with Xarelto stopped nasal septal fractures secondary to fall in mid January Nasal packing done in the emergency room Case has been discussed with ENT-recommended nasal packing to continue till Monday, continue Keflex 7 days Unable to return to assisted living facility until nasal packing is removed and discussion with social work  *Acute blood loss anemia  Stable Probably from epistaxis GI loss unlikely Xarelto held Status post gastroenterology evaluation Gastroenterology has discussed with patient's  family, patient family do not want any endoscopy and colonoscopy and aggressive measures  *Chronic advanced dementia Supportive care  *Dehydration Resolved with IV fluids for rehydration  *Acute nausea/emesis  Resolved  Antiemetics PRN   *Acute  hypokalemia Repleted  *Chronic constipation Continue current bowel regiment  DVT prophylaxis sequential compression device to lower extremities Disposition to assisted living facility on Monday after nasal packing has been removed-we will take the patient back and packing has been removed, will go back to assisted living facility with home health PT status post discharge with 24-hour assistance  All the records are reviewed and case discussed with Care Management/Social Worker. Management plans discussed with the patient, family and they are in agreement.  CODE STATUS: DNR  DVT Prophylaxis: SCDs  TOTAL TIME TAKING CARE OF THIS PATIENT: 35 minutes.   POSSIBLE D/C IN 2 to 3 DAYS, DEPENDING ON CLINICAL CONDITION.  [name] M.D on 01/21/2019 at 11:42 AM  Between 7am to 6pm - Pager - [phone]  After 6pm go to [email or web address] - password [email or web address] [facility]  Office  [phone]  CC: Primary care physician; Arnetha Courser, MD  Note: This dictation was prepared with Dragon dictation along with smaller phrase technology. Any transcriptional errors that result from this process are unintentional.   Gastroenterology has discussed with patients family They do not want any aggressive measures such endoscopy and colonoscopy

## 2019-01-21 NOTE — Clinical Social Work Note (Signed)
Patient has nose packing, ALF can not have patient return until the packing has been removed.  CSW continuing to follow patient's progress throughout discharge planning.  Jones Broom. [place], MSW, [place]  01/21/2019 10:35 AM

## 2019-01-22 NOTE — Progress Notes (Signed)
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 Darvon [Propoxyphene Hcl]     As a child    VITALS:  Blood pressure 135/76, pulse 68, temperature (!) 97.4 F (36.3 C), temperature source Oral, resp. rate 18, height 5\' 5"  (1.651 m), weight 74.4 kg, SpO2 100 %.  PHYSICAL EXAMINATION:   Physical Exam  GENERAL:  78 y.o.-year-old patient lying in the bed in no acute distress EYES: Pupils equal, round, reactive to light and accommodation. No scleral icterus. Extraocular muscles intact.  HEENT: Head atraumatic, normocephalic. Oropharynx clear Nasal packing intact NECK:  Supple, no jugular venous distention. No thyroid enlargement, no tenderness.  LUNGS: Normal breath sounds bilaterally, no wheezing, rales, rhonchi. No use of accessory muscles of respiration.  CARDIOVASCULAR: S1, S2 normal. No murmurs, rubs, or gallops.  ABDOMEN: Soft, nontender, nondistended. Bowel sounds present. No organomegaly or mass.  EXTREMITIES: No cyanosis, clubbing or edema b/l.    NEUROLOGIC: Cranial nerves II through XII are intact. No focal Motor or sensory deficits b/l.   PSYCHIATRIC: could not be assessed SKIN: No obvious rash, lesion, or ulcer.   LABORATORY PANEL:   CBC Recent Labs  Lab 01/19/19 0955  01/21/19 0450  WBC 7.6  --   --   HGB 7.7*    < > 8.5*  HCT 24.2*   < > 26.7*  PLT 179  --   --    < > = values in this interval not displayed.   ------------------------------------------------------------------------------------------------------------------ Chemistries  Recent Labs  Lab 01/19/19 0955  01/21/19 0450  NA 143  --   --   K 3.4*   < > 3.6  CL 115*  --   --   CO2 25  --   --   GLUCOSE 107*  --   --   BUN 15  --   --   CREATININE 0.76  --   --   CALCIUM 7.7*  --   --    < > = values in this interval not displayed.   ------------------------------------------------------------------------------------------------------------------  Cardiac Enzymes No results for input(s): TROPONINI in the last 168 hours. ------------------------------------------------------------------------------------------------------------------  RADIOLOGY:  No results found.   ASSESSMENT AND PLAN:  78 year old female patient with history of chronic atrial fibrillation on Xarelto, advanced dementia, B12 deficiency, hyperlipidemia had a mechanical fall at assisted living facility and has a nasal septal fracture on 01/08/2019.  *Acute epistaxis left nose Resolved Anticoagulation with Xarelto stopped nasal septal fractures secondary to fall in mid January Nasal packing done in the emergency room Case has been discussed with ENT-recommended nasal packing to continue till Monday, continue Keflex 7 days Unable to return to assisted living facility until nasal packing is removed and discussion with social work  *Acute blood loss anemia  Stable Probably from epistaxis GI loss unlikely Xarelto held Status post gastroenterology evaluation Gastroenterology has discussed with  patient's family, patient family do not want any endoscopy and colonoscopy and aggressive measures  *Chronic advanced dementia Supportive care  *Dehydration Resolved with IV fluids for rehydration  *Acute nausea/emesis  Resolved  Antiemetics PRN   *Acute  hypokalemia Repleted  *Chronic constipation Continue current bowel regiment  DVT prophylaxis sequential compression device to lower extremities Disposition to assisted living facility on Monday after nasal packing has been removed-we will take the patient back and packing has been removed, will go back to assisted living facility with home health PT status post discharge with 24-hour assistance  All the records are reviewed and case discussed with Care Management/Social Worker. Management plans discussed with the patient, family and they are in agreement.  CODE STATUS: DNR  DVT Prophylaxis: SCDs  TOTAL TIME TAKING CARE OF THIS PATIENT: 35 minutes.   POSSIBLE D/C IN 2 to 3 DAYS, DEPENDING ON CLINICAL CONDITION.  Avel Peace  M.D on 01/22/2019 at 10:37 AM  Between 7am to 6pm - Pager - [phone]  After 6pm go to [email or web address] - password [email or web address] [facility]  Office  [phone]  CC: Primary care physician; Arnetha Courser, MD  Note: This dictation was prepared with Dragon dictation along with smaller phrase technology. Any transcriptional errors that result from this process are unintentional.   Gastroenterology has discussed with patients family They do not want any aggressive measures such endoscopy and colonoscopy

## 2019-01-22 NOTE — Care Management Note (Signed)
Case Management Note  Patient Details  Name: Jennifer Klein MRN: [id number] Date of Birth: 04-05-1941  Subjective/Objective:   PT recommending home health. Patient is from assisted living facility. Spoke primary with daughter [name], since patient is confused. [name] is agreeable to home health. CMS Medicare.gov Compare Post Acute Care list reviewed with daughter and she prefers [facility]. Referral placed with [facility]. Will obtain orders from MD. Plans back to facility tomorrow once packing can be removed.                 Action/Plan:   Expected Discharge Date:                  Expected Discharge Plan:  [place]  In-House Referral:     Discharge planning Services  CM Consult  Post Acute Care Choice:  Home Health Choice offered to:  Adult Children  DME Arranged:    DME Agency:     HH Arranged:    HH Agency:  [facility]  Status of Service:  In process, will continue to follow  If discussed at Long Length of Stay Meetings, dates discussed:    Additional Comments:  Mikena Masoner A Jarreau Callanan, RN 01/22/2019, 11:22 AM

## 2019-01-23 DIAGNOSIS — Z7189 Other specified counseling: Secondary | ICD-10-CM

## 2019-01-23 DIAGNOSIS — Z66 Do not resuscitate: Secondary | ICD-10-CM

## 2019-01-23 DIAGNOSIS — Z515 Encounter for palliative care: Secondary | ICD-10-CM

## 2019-01-23 MED ORDER — CEPHALEXIN 500 MG PO CAPS: 500.0000 mg | ORAL_CAPSULE | Freq: Three times a day (TID) | ORAL | 0 refills | Status: DC

## 2019-01-23 MED ORDER — RIVAROXABAN 15 MG PO TABS: 15.0000 mg | ORAL_TABLET | Freq: Every day | ORAL | 0 refills | Status: DC

## 2019-01-23 NOTE — Clinical Social Work Note (Addendum)
CSW received phone call from [place] [facility], she stated that Jennifer Klein, Scientist, physiological, needs to assess patient before she is able to return back to ALF memory care.  Jennifer Klein informed this CSW that [place], will not be able to assess patient till tomorrow.  CSW updated physician and bedside nurse.  CSW was informed by palliative that the husband would like palliative to follow patient at [facility], [place] made referral to [facility] and [facility] nurse liaison.  CSW updated patient's husband Jennifer Klein.  Jones Broom. Norval Morton, MSW, [place]  01/23/2019 4:33 PM

## 2019-01-23 NOTE — Care Management Important Message (Addendum)
Blank copy of Medicare IM left in patient's room.  Unable to sign and no family available.

## 2019-01-23 NOTE — Consult Note (Addendum)
Consultation Note Date: 01/23/2019   Patient Name: Jennifer Klein  DOB: 1941-11-26  MRN: [id number]  Age / Sex: 78 y.o., female  PCP: Arnetha Courser, MD Referring Physician: Gorden Harms, MD  Reason for Consultation: Establishing goals of care  HPI/Patient Profile: 78 y.o. female admitted on 01/16/2019 from [facility] after staff noticed patient having continuous nose bleeding. She has a past medical history significant for atrial fibrillation on Xarelto, Vitamin B12 deficiency, hyperlipidemia, hypertension, systolic dysfunction (EF 94-80% 2014), Alzheimer's, and gout. Patient recently had a fall at facility on 01/08/19 with rsulted in nasal septum fracture. During ED course left nare was packed and ENT was consulted. Patient was originally going to be discharged back to facility but had episode of coffee ground emesis, dark colored stool,  and also became tachycardia (120s). IV metoprolol and fluids were given. NA 147, WBC 13.2, hemoglobin 10.4, platelets 302, UA negative. She was admitted for further GI evaluation and possible bleed, management of a-fib, and epistaxis. Since admission nasal packing remained in place until removed on 2/3 per ENT recommendations. Her Xarelto has been held due to bleeding, Gastroenterology saw patient and family declined endoscopy, colonoscopy, or aggressive measures. She continues to receive IV fluids for hydration support. Palliative Medicine team consulted for goals of care.   Clinical Assessment and Goals of Care: I have reviewed medical records including lab results, imaging, Epic notes, and MAR, received report from the bedside RN, and assessed the patient. I spoke with patient's husband Jennifer Klein to discuss diagnosis prognosis, [place], EOL wishes, disposition and options. Patient has advanced dementia. She is unable to provide medical history. She is alert,  but minimally responsive. She is unable to engage in conversation. She appears comfortable and in no respiratory distress.   I introduced Palliative Medicine as specialized medical care for people living with serious illness. It focuses on providing relief from the symptoms and stress of a serious illness. The goal is to improve quality of life for both the patient and the family. Husband verbalized understanding.   Jennifer Klein reports they do not have any children. They have been married for more than 50 years. 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 She enjoyed traveling and helping others.   Mr. Jennifer Klein reports patients has been a resident at [facility] for a little over 2 years due to worsening of her dementia. He reports for safety reasons and his own health decline he was no longer able to care for her in the home independently. He reports at the facility she was mobile and would walk with a walker at times. For weeks prior to admission she was wheelchair bound due to weakness. He states she fell in January due to weakness and dizziness. He endorses that she her appetite has declined over the past several months and this has caused her some fatigue and weakness. He states she has lost some weight but unsure total amount. She requires assistance with ADLs but was able to feed  herself with prompting and set-up assistance. Her dementia has worsened and she no longer recognizes him or other family members. He states she generally only speaks a few words, and when she does talk it does not make sense as to what she is trying to say due to her confusion.   We discussed her current illness and what it means in the larger context of her on-going co-morbidities.  Natural disease trajectory and expectations at EOL were discussed. Mr. Jennifer Klein reports he understands her condition, however, he remains hopeful she will at least show signs of improvement and return back to her baseline. He states he  knows she doesn't eat much but she will eat when she is hungry and he feels like if she gets back to her facility and works with PT some she may regain some strength to show interest in walking.   I attempted to elicit values and goals of care important to the patient.    The difference between aggressive medical intervention and comfort care was considered in light of the patient's goals of care. At this time he wishes to continue treating the treatable, however, no aggressive measures. He states he wouldn't want to put her through a lot of medical procedures or risk her chances of surviving. He expresses "if she passes away, I want it to be because she just because she passed away and not on somebody's operating or exam table." Support given. He continues to state he wants her to attempt to work with PT and have a chance at getting better because he has seen her do it before. He reports "I am not expecting a miracle, just a little improvement!" He is aware that patient will have to be able to follow commands to successfully work with PT and have an opportunity to show any forms of improvement. He verbalizes his understanding and expresses if they work with her he feels she would cooperate, and knows she may require more prompting.   Husband confirmed DNR/DNI and wishes for no artificial feedings.   Hospice and Palliative Care services outpatient were explained and offered. Husband expressed he thought at one time patient had palliative services. He verbalized he is not yet prepared for hospice and hopes she will continue on for more time. He is open to palliative if she does not already have services in place at [facility].   Questions and concerns were addressed. The family was encouraged to call with questions or concerns.  PMT will continue to support holistically.   Primary Decision Maker:  NEXT OF KIN/HUSBAND: Jennifer Klein    SUMMARY OF RECOMMENDATIONS    DNR/DNI-as confirmed by husband     Continue to treat the treatable per attending.   Husband remains hopeful for signs of improvement. Request patient returns to [facility] with rehab assistance. Outpatient palliative.   Randall Hiss, CSW verbalized awareness of discharge plans/palliative.   PMT will continue to support and follow as needed.   Code Status/Advance Care Planning:  DNR   Symptom Management:   Per attending   Palliative Prophylaxis:   Aspiration, Bowel Regimen, Delirium Protocol, Frequent Pain Assessment, Oral Care and Turn Reposition  Additional Recommendations (Limitations, Scope, Preferences):  Full Scope Treatment  Psycho-social/Spiritual:   Desire for further Chaplaincy support:NO   Additional Recommendations: Education on Hospice  Prognosis:   Guarded to Poor in the setting of acute epistaxis of left nare, nasal septal fracture s/p fall, advanced dementia, dehydration, hypokalemia, chronic constipation, hyperlipidemia, hypertension, a-fib (Xarelto), osteoporosis, falls,  and B12 deficiency.   Discharge Planning: [place] per husband w/PT, Palliative       Primary Diagnoses: Present on Admission: . Epistaxis   I have reviewed the medical record, interviewed the patient and family, and examined the patient. The following aspects are pertinent.  Past Medical History:  Diagnosis Date  . Alzheimer's dementia with behavioral disturbance ([place]) 05/04/2016  . Alzheimer's dementia without behavioral disturbance ([place]) 05/04/2016  . B12 deficiency 05/06/2016  . Gout   . Hemorrhoids   . Hyperlipidemia LDL goal <100 05/05/2016  . Hypertension   . Memory loss   . PAF (paroxysmal atrial fibrillation) ([place])    a. on Xarelto; b. 48-hr Holter 2014: NSR with PAF/flutter which happened at least 2.26% of the time w/ associated tachycardia. Patient was asymptomatic; c. CHADS2VASc => 4 (HTN, age x 2, female)  . Paroxysmal atrial fibrillation ([place]) 08/07/2013  . Senile osteoporosis   . Systolic  dysfunction    a. echo 08/2013: EF 50-55%, mild MR, mildly dilated LA   Social History   Socioeconomic History  . Marital status: Widowed    Spouse name: Not on file  . Number of children: Not on file  . Years of education: Not on file  . Highest education level: Not on file  Occupational History  . Not on file  Social Needs  . Financial resource strain: Not on file  . Food insecurity:    Worry: Not on file    Inability: Not on file  . Transportation needs:    Medical: Not on file    Non-medical: Not on file  Tobacco Use  . Smoking status: Former Smoker    Packs/day: 0.25    Years: 1.00    Pack years: 0.25    Types: Cigarettes  . Smokeless tobacco: Never Used  Substance and Sexual Activity  . Alcohol use: No    Alcohol/week: 0.0 standard drinks  . Drug use: No  . Sexual activity: Not on file  Lifestyle  . Physical activity:    Days per week: Not on file    Minutes per session: Not on file  . Stress: Not on file  Relationships  . Social connections:    Talks on phone: Not on file    Gets together: Not on file    Attends religious service: Not on file    Active member of club or organization: Not on file    Attends meetings of clubs or organizations: Not on file    Relationship status: Not on file  Other Topics Concern  . Not on file  Social History Narrative   Lives with husband in [place].   Worked at [employer] and [employer].      Diet - regular         Family History  Problem Relation Age of Onset  . Heart disease Father   . Gout Brother    Scheduled Meds: . allopurinol  100 mg Oral Daily  . atorvastatin  20 mg Oral QHS  . busPIRone  5 mg Oral BID  . cephALEXin  500 mg Oral Q8H  . cholecalciferol  1,000 Units Oral Daily  . docusate sodium  100 mg Oral BID  . donepezil  10 mg Oral QHS  . LORazepam  0.25 mg Oral BID  . mouth rinse  15 mL Mouth Rinse BID  . memantine  10 mg Oral BID  . metoprolol tartrate  25 mg Oral BID  . pantoprazole  40 mg Oral Daily   .  polyethylene glycol  17 g Oral Daily  . sodium chloride flush  3 mL Intravenous Q12H  . vitamin B-12  500 mcg Oral Daily   Continuous Infusions: PRN Meds:.acetaminophen **OR** acetaminophen, guaiFENesin, metoprolol tartrate, neomycin-bacitracin-polymyxin, ondansetron **OR** ondansetron (ZOFRAN) IV, sodium chloride flush, sodium phosphate Medications Prior to Admission:  Prior to Admission medications   Medication Sig Start Date End Date Taking? Authorizing Provider  acetaminophen (TYLENOL) 500 MG tablet Take 500 mg by mouth every 6 (six) hours as needed.   Yes [provider]  allopurinol (ZYLOPRIM) 100 MG tablet Take 1 tablet (100 mg total) by mouth daily. 10/27/18  Yes Lada, Satira Anis, MD  alum & mag hydroxide-simeth (MI-ACID) 200-200-20 MG/5ML suspension Take 30 mLs by mouth every 6 (six) hours as needed for indigestion or heartburn.   Yes [provider]  atorvastatin (LIPITOR) 20 MG tablet TAKE 1 TABLET BY MOUTH AT BEDTIME Patient taking differently: Take 20 mg by mouth daily.  06/07/18  Yes Lada, Satira Anis, MD  busPIRone (BUSPAR) 5 MG tablet TAKE 1 TABLET BY MOUTH 2 TIMES DAILY FORDEPRESSION Patient taking differently: Take 5 mg by mouth 2 (two) times daily.  05/30/18  Yes Lada, Satira Anis, MD  cholecalciferol (VITAMIN D) 1000 units tablet TAKE 1 TABLET BY MOUTH ONCE DAILY Patient taking differently: Take 1,000 Units by mouth daily.  04/13/18  Yes Lada, Satira Anis, MD  donepezil (ARICEPT) 10 MG tablet TAKE 1 TABLET BY MOUTH BEDTIME Patient taking differently: Take 10 mg by mouth at bedtime. TAKE 1 TABLET BY MOUTH BEDTIME 10/27/18  Yes Lada, Satira Anis, MD  guaifenesin (ROBITUSSIN) 100 MG/5ML syrup Take 200 mg by mouth every 6 (six) hours as needed for cough.    Yes [provider]  loperamide (IMODIUM) 2 MG capsule Take 2 mg by mouth as needed for diarrhea or loose stools.   Yes [provider]  LORazepam (ATIVAN) 0.5 MG tablet Take 0.5 tablets (0.25 mg  total) by mouth 2 (two) times daily. 11/09/18  Yes Lada, Satira Anis, MD  magnesium hydroxide (MILK OF MAGNESIA) 400 MG/5ML suspension Take 30 mLs by mouth daily as needed for mild constipation.   Yes [provider]  memantine (NAMENDA) 10 MG tablet Take 1 tablet (10 mg total) by mouth 2 (two) times daily. 10/27/18  Yes Lada, Satira Anis, MD  metoprolol tartrate (LOPRESSOR) 25 MG tablet Take 1 tablet (25 mg total) by mouth 2 (two) times daily. 11/04/18  Yes Wellington Hampshire, MD  neomycin-bacitracin-polymyxin (NEOSPORIN) [phone] ointment Apply topically 4 (four) times daily as needed.    Yes [provider]  pantoprazole (PROTONIX) 40 MG tablet Take 1 tablet (40 mg total) by mouth daily. 10/27/18  Yes Lada, Satira Anis, MD  vitamin B-12 (CYANOCOBALAMIN) 500 MCG tablet Take 500 mcg by mouth daily.   Yes [provider]  cephALEXin (KEFLEX) 500 MG capsule Take 1 capsule (500 mg total) by mouth every 8 (eight) hours. 01/23/19   Salary, Avel Peace, MD  Rivaroxaban (XARELTO) 15 MG TABS tablet Take 1 tablet (15 mg total) by mouth daily with supper. Start taking on January 28, 2019   TAKE 1 TABLET BY MOUTH DAILY WITH SUPPER 01/23/19   Salary, Avel Peace, MD   Allergies  Allergen Reactions  . Darvon [Propoxyphene Hcl]     As a child   Review of Systems  Unable to perform ROS: Dementia    Physical Exam Vitals signs and nursing note reviewed.  Constitutional:  Appearance: She is normal weight. She is ill-appearing.     Comments: Chronically ill appearing   Cardiovascular:     Rate and Rhythm: Normal rate and regular rhythm.     Pulses: Normal pulses.     Heart sounds: Normal heart sounds.  Pulmonary:     Effort: Pulmonary effort is normal.     Breath sounds: Decreased breath sounds present.  Abdominal:     General: Bowel sounds are normal.     Palpations: Abdomen is soft.  Skin:    General: Skin is warm and dry.  Neurological:     Mental Status: She is alert and  easily aroused.     Motor: Weakness present.     Comments: Alert, no verbal response, hx of dementia   Psychiatric:        Cognition and Memory: Cognition is impaired. Memory is impaired.        Judgment: Judgment is inappropriate.     Vital Signs: BP (!) 111/58   Pulse 73   Temp (!) 97.3 F (36.3 C) (Oral)   Resp 18   Ht 5\' 5"  (1.651 m)   Wt 74.4 kg   SpO2 (!) 80%   BMI 27.29 kg/m  Pain Scale: FLACC   Pain Score: 0-No pain   SpO2: SpO2: (!) 80 % O2 Device:SpO2: (!) 80 % O2 Flow Rate: .   IO: Intake/output summary: No intake or output data in the 24 hours ending 01/23/19 1334  LBM: Last BM Date: 01/20/19 Baseline Weight: Weight: 67.1 kg Most recent weight: Weight: 74.4 kg     Palliative Assessment/Data: PPS 30%   Flowsheet Rows     Most Recent Value  Intake Tab  Referral Department  Hospitalist  Unit at Time of Referral  Cardiac/Telemetry Unit  Palliative Care Primary Diagnosis  Other (Comment) [Alzheimer's]  Date Notified  01/20/19  Palliative Care Type  New Palliative care  Reason for referral  Clarify Goals of Care  Date of Admission  01/16/19  Date first seen by Palliative Care  01/23/19  # of days Palliative referral response time  3 Day(s)  # of days IP prior to Palliative referral  4  Clinical Assessment  Psychosocial & Spiritual Assessment  Palliative Care Outcomes      Time In: 1100 Time Out: 1215 Time Total: 75 min  Greater than 50%  of this time was spent counseling and coordinating care related to the above assessment and plan.  Signed by:  Alda Lea, AGPCNP-BC Palliative Medicine Team  Phone: [phone] Fax: [phone] Pager: [phone] Amion: Bjorn Pippin    Please contact Palliative Medicine Team phone at [phone] for questions and concerns.  For individual provider: See Shea Evans

## 2019-01-23 NOTE — Progress Notes (Signed)
New referral for outpatient Palliative to follow at [facility] received from [place].Patient information faxed to referral.  Flo Shanks RN, BSN, [facility] and Palliative care of [place], hospital Liaison [phone]

## 2019-01-23 NOTE — NC FL2 (Signed)
Success LEVEL OF CARE SCREENING TOOL     IDENTIFICATION  Patient Name: Jennifer Klein Birthdate: [birth year] Sex: female Admission Date (Current Location): 01/16/2019  [place] and [place] Number:  Engineering geologist and Address:  [facility], [address], [place], [place] [address]      Provider Number: [id number]  Attending Physician Name and Address:  Gorden Harms, MD  Relative Name and Phone Number:  Doreene Eland Daughter [phone]  or Ericka Pontiff   [phone]     Current Level of Care: Hospital Recommended Level of Care: Memory Care Prior Approval Number:    Date Approved/Denied:   PASRR Number:    Discharge Plan: Domiciliary (Rest home)([facility] memory Care)    Current Diagnoses: Patient Active Problem List   Diagnosis Date Noted  . Acute post-hemorrhagic anemia   . Epistaxis 01/16/2019  . Sepsis ([place]) 06/23/2018  . Fall 09/10/2017  . PAD (peripheral artery disease) ([place]) 06/22/2017  . Chronic venous insufficiency 06/22/2017  . CKD (chronic kidney disease) stage 3, GFR 30-59 ml/min (HCC) 09/04/2016  . B12 deficiency 05/06/2016  . Hyperlipidemia LDL goal <100 05/05/2016  . Alzheimer's dementia without behavioral disturbance ([place]) 05/04/2016  . Medication monitoring encounter 05/04/2016  . Barrett's esophagus 11/09/2015  . Paroxysmal atrial fibrillation ([place]) 08/07/2013  . Osteopenia 08/08/2012  . Hypertension 06/03/2012  . Gout 06/03/2012    Orientation RESPIRATION BLADDER Height & Weight     Self  Normal Incontinent Weight: 164 lb (74.4 kg) Height:  5\' 5"  (165.1 cm)  BEHAVIORAL SYMPTOMS/MOOD NEUROLOGICAL BOWEL NUTRITION STATUS      Incontinent Diet(Soft diet)  AMBULATORY STATUS COMMUNICATION OF NEEDS Skin   Supervision Verbally Normal                       Personal Care Assistance Level of Assistance  Feeding, Bathing, Dressing Bathing Assistance: Limited assistance Feeding  assistance: Limited assistance Dressing Assistance: Limited assistance     Functional Limitations Info  Sight, Hearing, Speech Sight Info: Adequate Hearing Info: Adequate Speech Info: Impaired    SPECIAL CARE FACTORS FREQUENCY  PT (By licensed PT)     PT Frequency: Home Health minimum 2x a week              Contractures      Additional Factors Info  Code Status, Allergies, Psychotropic Code Status Info: DNR Allergies Info: DARVON PROPOXYPHENE HCL  Psychotropic Info: busPIRone (BUSPAR) tablet 5 mg or LORazepam (ATIVAN) tablet 0.25 mg          Current Medications (01/23/2019):  This is the current hospital active medication list Current Facility-Administered Medications  Medication Dose Route Frequency Provider Last Rate Last Dose  . acetaminophen (TYLENOL) tablet 650 mg  650 mg Oral Q6H PRN Fritzi Mandes, MD       Or  . acetaminophen (TYLENOL) suppository 650 mg  650 mg Rectal Q6H PRN Fritzi Mandes, MD      . allopurinol (ZYLOPRIM) tablet 100 mg  100 mg Oral Daily Fritzi Mandes, MD   100 mg at 01/23/19 1013  . atorvastatin (LIPITOR) tablet 20 mg  20 mg Oral QHS Fritzi Mandes, MD   20 mg at 01/22/19 2304  . busPIRone (BUSPAR) tablet 5 mg  5 mg Oral BID Fritzi Mandes, MD   5 mg at 01/23/19 1013  . cephALEXin (KEFLEX) capsule 500 mg  500 mg Oral Q8H Pyreddy, Pavan, MD   500 mg at 01/23/19 1400  .  cholecalciferol (VITAMIN D) tablet 1,000 Units  1,000 Units Oral Daily Fritzi Mandes, MD   1,000 Units at 01/23/19 1013  . docusate sodium (COLACE) capsule 100 mg  100 mg Oral BID Saundra Shelling, MD   100 mg at 01/23/19 1013  . donepezil (ARICEPT) tablet 10 mg  10 mg Oral QHS Fritzi Mandes, MD   10 mg at 01/22/19 2301  . guaiFENesin (ROBITUSSIN) 100 MG/5ML solution 200 mg  200 mg Oral Q6H PRN Fritzi Mandes, MD      . LORazepam (ATIVAN) tablet 0.25 mg  0.25 mg Oral BID Fritzi Mandes, MD   0.25 mg at 01/23/19 1013  . MEDLINE mouth rinse  15 mL Mouth Rinse BID Salary, Montell D, MD   15 mL at 01/23/19  1000  . memantine (NAMENDA) tablet 10 mg  10 mg Oral BID Fritzi Mandes, MD   10 mg at 01/23/19 1013  . metoprolol tartrate (LOPRESSOR) injection 5 mg  5 mg Intravenous Q4H PRN Fritzi Mandes, MD   5 mg at 01/16/19 1855  . metoprolol tartrate (LOPRESSOR) tablet 25 mg  25 mg Oral BID Fritzi Mandes, MD   25 mg at 01/23/19 1007  . neomycin-bacitracin-polymyxin (NEOSPORIN) ointment packet 1 application  1 application Topical QID PRN Fritzi Mandes, MD   1 application at 84/16/60 1042  . ondansetron (ZOFRAN) tablet 4 mg  4 mg Oral Q6H PRN Fritzi Mandes, MD       Or  . ondansetron [facility]) injection 4 mg  4 mg Intravenous Q6H PRN Fritzi Mandes, MD      . pantoprazole (PROTONIX) EC tablet 40 mg  40 mg Oral Daily Pyreddy, Reatha Harps, MD   40 mg at 01/23/19 1013  . polyethylene glycol (MIRALAX / GLYCOLAX) packet 17 g  17 g Oral Daily Pyreddy, Pavan, MD   17 g at 01/23/19 1006  . sodium chloride flush (NS) 0.9 % injection 3 mL  3 mL Intravenous Q12H Pyreddy, Reatha Harps, MD   3 mL at 01/22/19 2308  . sodium chloride flush (NS) 0.9 % injection 3 mL  3 mL Intravenous PRN Pyreddy, Reatha Harps, MD      . sodium phosphate (FLEET) 7-19 GM/118ML enema 1 enema  1 enema Rectal Daily PRN Saundra Shelling, MD   1 enema at 01/20/19 1400  . vitamin B-12 (CYANOCOBALAMIN) tablet 500 mcg  500 mcg Oral Daily Fritzi Mandes, MD   500 mcg at 01/23/19 1007     Discharge Medications: STOP taking these medications   amoxicillin-clavulanate 875-125 MG tablet Commonly known as:  AUGMENTIN     TAKE these medications   acetaminophen 500 MG tablet Commonly known as:  TYLENOL Take 500 mg by mouth every 6 (six) hours as needed.   allopurinol 100 MG tablet Commonly known as:  ZYLOPRIM Take 1 tablet (100 mg total) by mouth daily.   atorvastatin 20 MG tablet Commonly known as:  LIPITOR TAKE 1 TABLET BY MOUTH AT BEDTIME What changed:  when to take this   busPIRone 5 MG tablet Commonly known as:  BUSPAR TAKE 1 TABLET BY MOUTH 2 TIMES DAILY  FORDEPRESSION What changed:  See the new instructions.   cephALEXin 500 MG capsule Commonly known as:  KEFLEX Take 1 capsule (500 mg total) by mouth every 8 (eight) hours.   cholecalciferol 25 MCG (1000 UT) tablet Commonly known as:  VITAMIN D TAKE 1 TABLET BY MOUTH ONCE DAILY   donepezil 10 MG tablet Commonly known as:  ARICEPT TAKE 1 TABLET BY MOUTH BEDTIME What  changed:    how much to take  how to take this  when to take this   guaifenesin 100 MG/5ML syrup Commonly known as:  ROBITUSSIN Take 200 mg by mouth every 6 (six) hours as needed for cough.   loperamide 2 MG capsule Commonly known as:  IMODIUM Take 2 mg by mouth as needed for diarrhea or loose stools.   LORazepam 0.5 MG tablet Commonly known as:  ATIVAN Take 0.5 tablets (0.25 mg total) by mouth 2 (two) times daily.   magnesium hydroxide 400 MG/5ML suspension Commonly known as:  MILK OF MAGNESIA Take 30 mLs by mouth daily as needed for mild constipation.   memantine 10 MG tablet Commonly known as:  NAMENDA Take 1 tablet (10 mg total) by mouth 2 (two) times daily.   metoprolol tartrate 25 MG tablet Commonly known as:  LOPRESSOR Take 1 tablet (25 mg total) by mouth 2 (two) times daily.   MI-ACID 200-200-20 MG/5ML suspension Generic drug:  alum & mag hydroxide-simeth Take 30 mLs by mouth every 6 (six) hours as needed for indigestion or heartburn.   neomycin-bacitracin-polymyxin [phone] ointment Apply topically 4 (four) times daily as needed.   pantoprazole 40 MG tablet Commonly known as:  PROTONIX Take 1 tablet (40 mg total) by mouth daily.   Rivaroxaban 15 MG Tabs tablet Commonly known as:  XARELTO Take 1 tablet (15 mg total) by mouth daily with supper. Start taking on January 28, 2019   TAKE 1 TABLET BY MOUTH DAILY WITH SUPPER What changed:    how much to take  how to take this  when to take this  additional instructions   vitamin B-12 500 MCG tablet Commonly known  as:  CYANOCOBALAMIN Take 500 mcg by mouth daily.     Relevant Imaging Results:  Relevant Lab Results:   Additional Information    Leia Coletti, [place], LCSWA

## 2019-01-23 NOTE — Care Management Note (Signed)
Case Management Note  Patient Details  Name: Jennifer Klein MRN: [id number] Date of Birth: Feb 07, 1941  Subjective/Objective:     Notified Cheryl with [facility] of patient discharge today.               Action/Plan:   Expected Discharge Date:  01/23/19               Expected Discharge Plan:  [place]  In-House Referral:     Discharge planning Services  CM Consult  Post Acute Care Choice:  Home Health Choice offered to:  Adult Children  DME Arranged:    DME Agency:     HH Arranged:    HH Agency:  [facility]  Status of Service:  Completed, signed off  If discussed at Long Length of Stay Meetings, dates discussed:    Additional Comments:  Elza Rafter, RN 01/23/2019, 10:56 AM

## 2019-01-23 NOTE — Discharge Summary (Addendum)
[facility] NAME: Jennifer Klein    MR#:  [id number]  DATE OF BIRTH:  [birth year]  DATE OF ADMISSION:  01/16/2019 ADMITTING PHYSICIAN: Fritzi Mandes, MD  DATE OF DISCHARGE: No discharge date for patient encounter.  PRIMARY CARE PHYSICIAN: Arnetha Courser, MD    ADMISSION DIAGNOSIS:  Hypernatremia [E87.0] Left-sided epistaxis [R04.0]  DISCHARGE DIAGNOSIS:  Active Problems:   Epistaxis   Acute post-hemorrhagic anemia   SECONDARY DIAGNOSIS:   Past Medical History:  Diagnosis Date  . Alzheimer's dementia with behavioral disturbance ([place]) 05/04/2016  . Alzheimer's dementia without behavioral disturbance ([place]) 05/04/2016  . B12 deficiency 05/06/2016  . Gout   . Hemorrhoids   . Hyperlipidemia LDL goal <100 05/05/2016  . Hypertension   . Memory loss   . PAF (paroxysmal atrial fibrillation) ([place])    a. on Xarelto; b. 48-hr Holter 2014: NSR with PAF/flutter which happened at least 2.26% of the time w/ associated tachycardia. Patient was asymptomatic; c. CHADS2VASc => 4 (HTN, age x 2, female)  . Paroxysmal atrial fibrillation ([place]) 08/07/2013  . Senile osteoporosis   . Systolic dysfunction    a. echo 08/2013: EF 50-55%, mild MR, mildly dilated LA    HOSPITAL COURSE:  78 year old female patient with history of chronic atrial fibrillation on Xarelto, advanced dementia, B12 deficiency, hyperlipidemia had a mechanical fall at assisted living facility and has a nasal septal fracture on 01/08/2019.  *Acute epistaxis left nose Resolved Anticoagulation with Xarelto held while in house  nasal septal fractures secondary to fall in mid January Nasal packing done in the emergency room, remove January 23, 2019 Case has been discussed with ENT-recommended nasal packing to continue till Monday/Keflex 7 days/nasal packing removed on Monday  Patient was unable to return to assisted living facility until nasal packing was removed in discussion  with social work  *Acute blood loss anemia  Stable Probably from epistaxis GI loss unlikely Xarelto held Status post gastroenterology evaluation Gastroenterology has discussed with patient's family, patient family do not want any endoscopy and colonoscopy and aggressive measures  *Chronic advanced dementia Supportive care  *Dehydration Resolved with IV fluids for rehydration  *Acute nausea/emesis  Resolved  Provided antiemetics PRN   *Acute hypokalemia Repleted  *Chronic constipation Continue current bowel regiment  Paliative Care with discharge  DISCHARGE CONDITIONS:   stable  CONSULTS OBTAINED:    DRUG ALLERGIES:   Allergies  Allergen Reactions  . Darvon [Propoxyphene Hcl]     As a child    DISCHARGE MEDICATIONS:   Allergies as of 01/23/2019      Reactions   Darvon [propoxyphene Hcl]    As a child      Medication List    STOP taking these medications   amoxicillin-clavulanate 875-125 MG tablet Commonly known as:  AUGMENTIN     TAKE these medications   acetaminophen 500 MG tablet Commonly known as:  TYLENOL Take 500 mg by mouth every 6 (six) hours as needed.   allopurinol 100 MG tablet Commonly known as:  ZYLOPRIM Take 1 tablet (100 mg total) by mouth daily.   atorvastatin 20 MG tablet Commonly known as:  LIPITOR TAKE 1 TABLET BY MOUTH AT BEDTIME What changed:  when to take this   busPIRone 5 MG tablet Commonly known as:  BUSPAR TAKE 1 TABLET BY MOUTH 2 TIMES DAILY FORDEPRESSION What changed:  See the new instructions.   cephALEXin 500 MG capsule Commonly known as:  KEFLEX Take 1 capsule (  500 mg total) by mouth every 8 (eight) hours.   cholecalciferol 25 MCG (1000 UT) tablet Commonly known as:  VITAMIN D TAKE 1 TABLET BY MOUTH ONCE DAILY   donepezil 10 MG tablet Commonly known as:  ARICEPT TAKE 1 TABLET BY MOUTH BEDTIME What changed:    how much to take  how to take this  when to take this   guaifenesin 100 MG/5ML  syrup Commonly known as:  ROBITUSSIN Take 200 mg by mouth every 6 (six) hours as needed for cough.   loperamide 2 MG capsule Commonly known as:  IMODIUM Take 2 mg by mouth as needed for diarrhea or loose stools.   LORazepam 0.5 MG tablet Commonly known as:  ATIVAN Take 0.5 tablets (0.25 mg total) by mouth 2 (two) times daily.   magnesium hydroxide 400 MG/5ML suspension Commonly known as:  MILK OF MAGNESIA Take 30 mLs by mouth daily as needed for mild constipation.   memantine 10 MG tablet Commonly known as:  NAMENDA Take 1 tablet (10 mg total) by mouth 2 (two) times daily.   metoprolol tartrate 25 MG tablet Commonly known as:  LOPRESSOR Take 1 tablet (25 mg total) by mouth 2 (two) times daily.   MI-ACID 200-200-20 MG/5ML suspension Generic drug:  alum & mag hydroxide-simeth Take 30 mLs by mouth every 6 (six) hours as needed for indigestion or heartburn.   neomycin-bacitracin-polymyxin [id number] ointment Apply topically 4 (four) times daily as needed.   pantoprazole 40 MG tablet Commonly known as:  PROTONIX Take 1 tablet (40 mg total) by mouth daily.   Rivaroxaban 15 MG Tabs tablet Commonly known as:  XARELTO Take 1 tablet (15 mg total) by mouth daily with supper. Start taking on January 28, 2019   TAKE 1 TABLET BY MOUTH DAILY WITH SUPPER What changed:    how much to take  how to take this  when to take this  additional instructions   vitamin B-12 500 MCG tablet Commonly known as:  CYANOCOBALAMIN Take 500 mcg by mouth daily.        DISCHARGE INSTRUCTIONS:    If you experience worsening of your admission symptoms, develop shortness of breath, life threatening emergency, suicidal or homicidal thoughts you must seek medical attention immediately by calling 911 or calling your MD immediately  if symptoms less severe.  You Must read complete instructions/literature along with all the possible adverse reactions/side effects for all the Medicines you take and  that have been prescribed to you. Take any new Medicines after you have completely understood and accept all the possible adverse reactions/side effects.   Please note  You were cared for by a hospitalist during your hospital stay. If you have any questions about your discharge medications or the care you received while you were in the hospital after you are discharged, you can call the unit and asked to speak with the hospitalist on call if the hospitalist that took care of you is not available. Once you are discharged, your primary care physician will handle any further medical issues. Please note that NO REFILLS for any discharge medications will be authorized once you are discharged, as it is imperative that you return to your primary care physician (or establish a relationship with a primary care physician if you do not have one) for your aftercare needs so that they can reassess your need for medications and monitor your lab values.    Today   CHIEF COMPLAINT:   Chief Complaint  Patient presents with  .  Epistaxis    HISTORY OF PRESENT ILLNESS:  78 y.o. female with a known history of chronic atrial fibrillation on Xarelto, severe advanced dementia, B12 deficiency, hyperlipidemia comes to the emergency room from assisted living when she was found to have epistaxis as noted by staff at the facility. Patient had a mechanical fall and was seen in the emergency room where she was found to have the nasal septum fracture 119 2020 after she had a mechanical fall.  Left knee air packing was done by ER physician. ER physician spoke with ENT Dr. Tami Ribas who recommended called ENT if bleeding beakers else continue the packing and prophylactic antibiotic  Patient was ready to be sent back to the facility however she started having vomiting and had some coffee ground emesis along with tachycardia and heart rate went into the 120s.  She received IV fluids, IV metoprolol. Currently she is  hemodynamically stable with heart rate in the one tends. She is being admitted with acute on chronic a fib, epistaxis and possible G.I. bleed.  pt also noted to have couple dark blackish colored stools while in the emergency room.  Patient has advanced dementia unable to give any history review of systems.  Spoke with patient's been Consuelo Pandy his number is [phone]   VITAL SIGNS:  Blood pressure (!) 111/58, pulse 73, temperature (!) 97.3 F (36.3 C), temperature source Oral, resp. rate 18, height 5\' 5"  (1.651 m), weight 74.4 kg, SpO2 (!) 80 %.  I/O:    Intake/Output Summary (Last 24 hours) at 01/23/2019 1031 Last data filed at 01/22/2019 1200 Gross per 24 hour  Intake -  Output 700 ml  Net -700 ml    PHYSICAL EXAMINATION:  GENERAL:  78 y.o.-year-old patient lying in the bed with no acute distress.  EYES: Pupils equal, round, reactive to light and accommodation. No scleral icterus. Extraocular muscles intact.  HEENT: Head atraumatic, normocephalic. Oropharynx and nasopharynx clear.  NECK:  Supple, no jugular venous distention. No thyroid enlargement, no tenderness.  LUNGS: Normal breath sounds bilaterally, no wheezing, rales,rhonchi or crepitation. No use of accessory muscles of respiration.  CARDIOVASCULAR: S1, S2 normal. No murmurs, rubs, or gallops.  ABDOMEN: Soft, non-tender, non-distended. Bowel sounds present. No organomegaly or mass.  EXTREMITIES: No pedal edema, cyanosis, or clubbing.  NEUROLOGIC: Cranial nerves II through XII are intact. Muscle strength 5/5 in all extremities. Sensation intact. Gait not checked.  PSYCHIATRIC: The patient is alert and oriented x 3.  SKIN: No obvious rash, lesion, or ulcer.   DATA REVIEW:   CBC Recent Labs  Lab 01/19/19 0955  01/21/19 0450  WBC 7.6  --   --   HGB 7.7*   < > 8.5*  HCT 24.2*   < > 26.7*  PLT 179  --   --    < > = values in this interval not displayed.    Chemistries  Recent Labs  Lab 01/19/19 0955   01/21/19 0450  NA 143  --   --   K 3.4*   < > 3.6  CL 115*  --   --   CO2 25  --   --   GLUCOSE 107*  --   --   BUN 15  --   --   CREATININE 0.76  --   --   CALCIUM 7.7*  --   --    < > = values in this interval not displayed.    Cardiac Enzymes No results for input(s): TROPONINI in the  last 168 hours.  Microbiology Results  Results for orders placed or performed during the hospital encounter of 01/16/19  MRSA PCR Screening     Status: None   Collection Time: 01/18/19  7:52 AM  Result Value Ref Range Status   MRSA by PCR NEGATIVE NEGATIVE Final    Comment:        The GeneXpert MRSA Assay (FDA approved for NASAL specimens only), is one component of a comprehensive MRSA colonization surveillance program. It is not intended to diagnose MRSA infection nor to guide or monitor treatment for MRSA infections. Performed at [facility], [address] [place], [place] [address]     RADIOLOGY:  No results found.  EKG:   Orders placed or performed during the hospital encounter of 01/16/19  . ED EKG  . ED EKG      Management plans discussed with the patient, family and they are in agreement.  CODE STATUS:     Code Status Orders  (From admission, onward)         Start     Ordered   01/16/19 1603  Do not attempt resuscitation (DNR)  Continuous    Question Answer Comment  In the event of cardiac or respiratory ARREST Do not call a "code blue"   In the event of cardiac or respiratory ARREST Do not perform Intubation, CPR, defibrillation or ACLS   In the event of cardiac or respiratory ARREST Use medication by any route, position, wound care, and other measures to relive pain and suffering. May use oxygen, suction and manual treatment of airway obstruction as needed for comfort.      01/16/19 1602        Code Status History    Date Active Date Inactive Code Status Order ID Comments User Context   06/24/2018 0013 06/27/2018 2111 Full Code [id number]  Amelia Jo, MD Inpatient      TOTAL TIME TAKING CARE OF THIS PATIENT: 40 minutes.    Avel Peace Salary M.D on 01/23/2019 at 10:31 AM  Between 7am to 6pm - Pager - [phone]  After 6pm go to [email or web address] - password [email or web address] [place] [facility]  Office  [phone]  CC: Primary care physician; Arnetha Courser, MD   Note: This dictation was prepared with Dragon dictation along with smaller phrase technology. Any transcriptional errors that result from this process are unintentional.

## 2019-01-24 DIAGNOSIS — D649 Anemia, unspecified: Secondary | ICD-10-CM | POA: Diagnosis not present

## 2019-01-24 DIAGNOSIS — K219 Gastro-esophageal reflux disease without esophagitis: Secondary | ICD-10-CM | POA: Diagnosis not present

## 2019-01-24 DIAGNOSIS — E785 Hyperlipidemia, unspecified: Secondary | ICD-10-CM | POA: Diagnosis not present

## 2019-01-24 DIAGNOSIS — R404 Transient alteration of awareness: Secondary | ICD-10-CM | POA: Diagnosis not present

## 2019-01-24 DIAGNOSIS — S031XXS Dislocation of septal cartilage of nose, sequela: Secondary | ICD-10-CM | POA: Diagnosis not present

## 2019-01-24 DIAGNOSIS — F0390 Unspecified dementia without behavioral disturbance: Secondary | ICD-10-CM | POA: Diagnosis not present

## 2019-01-24 DIAGNOSIS — R262 Difficulty in walking, not elsewhere classified: Secondary | ICD-10-CM | POA: Diagnosis not present

## 2019-01-24 DIAGNOSIS — E569 Vitamin deficiency, unspecified: Secondary | ICD-10-CM | POA: Diagnosis not present

## 2019-01-24 DIAGNOSIS — M6281 Muscle weakness (generalized): Secondary | ICD-10-CM | POA: Diagnosis not present

## 2019-01-24 DIAGNOSIS — I10 Essential (primary) hypertension: Secondary | ICD-10-CM | POA: Diagnosis not present

## 2019-01-24 DIAGNOSIS — R52 Pain, unspecified: Secondary | ICD-10-CM | POA: Diagnosis not present

## 2019-01-24 DIAGNOSIS — F0391 Unspecified dementia with behavioral disturbance: Secondary | ICD-10-CM | POA: Diagnosis not present

## 2019-01-24 DIAGNOSIS — M2550 Pain in unspecified joint: Secondary | ICD-10-CM | POA: Diagnosis not present

## 2019-01-24 DIAGNOSIS — R296 Repeated falls: Secondary | ICD-10-CM | POA: Diagnosis not present

## 2019-01-24 DIAGNOSIS — F3289 Other specified depressive episodes: Secondary | ICD-10-CM | POA: Diagnosis not present

## 2019-01-24 DIAGNOSIS — S022XXD Fracture of nasal bones, subsequent encounter for fracture with routine healing: Secondary | ICD-10-CM | POA: Diagnosis not present

## 2019-01-24 DIAGNOSIS — F064 Anxiety disorder due to known physiological condition: Secondary | ICD-10-CM | POA: Diagnosis not present

## 2019-01-24 DIAGNOSIS — Z7401 Bed confinement status: Secondary | ICD-10-CM | POA: Diagnosis not present

## 2019-01-24 DIAGNOSIS — I4891 Unspecified atrial fibrillation: Secondary | ICD-10-CM | POA: Diagnosis not present

## 2019-01-24 DIAGNOSIS — M1009 Idiopathic gout, multiple sites: Secondary | ICD-10-CM | POA: Diagnosis not present

## 2019-01-24 DIAGNOSIS — R040 Epistaxis: Secondary | ICD-10-CM | POA: Diagnosis not present

## 2019-01-24 DIAGNOSIS — R41841 Cognitive communication deficit: Secondary | ICD-10-CM | POA: Diagnosis not present

## 2019-01-24 NOTE — Clinical Social Work Note (Addendum)
CSW received phone call from [facility], they said patient needs to go to SNF first before she is able to return back to ALF.  CSW discussed with patient's husband, and he was in agreement to going to SNF.  Patient's husband was prevented bed offers and he chose [facility] with Palliative to follow.  CSW contacted [facility] and they can accept patient today.  Patient to be d/c'ed today to [facility] room 705.  Patient and family agreeable to plans will transport via ems RN to call report to [phone].  Patient's husband is aware that patient is discharging today.  Evette Cristal, MSW, [place]

## 2019-01-24 NOTE — NC FL2 (Signed)
[place] LEVEL OF CARE SCREENING TOOL     IDENTIFICATION  Patient Name: Jennifer Klein Birthdate: 1941-08-25 Sex: female Admission Date (Current Location): 01/16/2019  [place] and [place] Number:  Engineering geologist and Address:  [facility], [address] [place], [place] [address]      Provider Number: [id number]  Attending Physician Name and Address:  Gorden Harms, MD  Relative Name and Phone Number:  Doreene Eland Daughter [phone]  or Ericka Pontiff   [phone]     Current Level of Care: Hospital Recommended Level of Care: [place] Prior Approval Number:    Date Approved/Denied:   PASRR Number: [id number]  Discharge Plan: SNF    Current Diagnoses: Patient Active Problem List   Diagnosis Date Noted  . Acute post-hemorrhagic anemia   . Epistaxis 01/16/2019  . Sepsis ([place]) 06/23/2018  . Fall 09/10/2017  . PAD (peripheral artery disease) ([place]) 06/22/2017  . Chronic venous insufficiency 06/22/2017  . CKD (chronic kidney disease) stage 3, GFR 30-59 ml/min (H[facility]) 09/04/2016  . B12 deficiency 05/06/2016  . Hyperlipidemia LDL goal <100 05/05/2016  . Alzheimer's dementia without behavioral disturbance ([place]) 05/04/2016  . Medication monitoring encounter 05/04/2016  . Barrett's esophagus 11/09/2015  . Paroxysmal atrial fibrillation ([place]) 08/07/2013  . Osteopenia 08/08/2012  . Hypertension 06/03/2012  . Gout 06/03/2012    Orientation RESPIRATION BLADDER Height & Weight     Self, Place  Normal Incontinent Weight: 164 lb (74.4 kg) Height:  5\' 5"  (165.1 cm)  BEHAVIORAL SYMPTOMS/MOOD NEUROLOGICAL BOWEL NUTRITION STATUS      Incontinent Diet(Mechanical Soft)  AMBULATORY STATUS COMMUNICATION OF NEEDS Skin   Supervision Verbally Normal                       Personal Care Assistance Level of Assistance  Feeding, Bathing, Dressing Bathing Assistance: Limited assistance Feeding assistance:  Limited assistance Dressing Assistance: Limited assistance     Functional Limitations Info  Sight, Hearing, Speech Sight Info: Adequate Hearing Info: Adequate Speech Info: Impaired    SPECIAL CARE FACTORS FREQUENCY  PT (By licensed PT)     PT Frequency: SNF 5x a week              Contractures Contractures Info: Not present    Additional Factors Info  Code Status, Allergies, Psychotropic Code Status Info: DNR Allergies Info: DARVON PROPOXYPHENE HCL  Psychotropic Info: busPIRone (BUSPAR) tablet 5 mg and LORazepam (ATIVAN) tablet 0.25 mg          Current Medications (01/24/2019):  This is the current hospital active medication list Current Facility-Administered Medications  Medication Dose Route Frequency Provider Last Rate Last Dose  . acetaminophen (TYLENOL) tablet 650 mg  650 mg Oral Q6H PRN Fritzi Mandes, MD       Or  . acetaminophen (TYLENOL) suppository 650 mg  650 mg Rectal Q6H PRN Fritzi Mandes, MD      . allopurinol (ZYLOPRIM) tablet 100 mg  100 mg Oral Daily Fritzi Mandes, MD   100 mg at 01/24/19 0956  . atorvastatin (LIPITOR) tablet 20 mg  20 mg Oral QHS Fritzi Mandes, MD   20 mg at 01/23/19 2249  . busPIRone (BUSPAR) tablet 5 mg  5 mg Oral BID Fritzi Mandes, MD   5 mg at 01/24/19 0956  . cholecalciferol (VITAMIN D) tablet 1,000 Units  1,000 Units Oral Daily Fritzi Mandes, MD   1,000 Units at 01/24/19 [phone]  . docusate sodium (COLACE)  capsule 100 mg  100 mg Oral BID Saundra Shelling, MD   100 mg at 01/24/19 0955  . donepezil (ARICEPT) tablet 10 mg  10 mg Oral QHS Fritzi Mandes, MD   10 mg at 01/23/19 2248  . guaiFENesin (ROBITUSSIN) 100 MG/5ML solution 200 mg  200 mg Oral Q6H PRN Fritzi Mandes, MD      . LORazepam (ATIVAN) tablet 0.25 mg  0.25 mg Oral BID Fritzi Mandes, MD   0.25 mg at 01/24/19 0957  . MEDLINE mouth rinse  15 mL Mouth Rinse BID Salary, Montell D, MD   15 mL at 01/23/19 2251  . memantine (NAMENDA) tablet 10 mg  10 mg Oral BID Fritzi Mandes, MD   10 mg at 01/24/19 0956   . metoprolol tartrate (LOPRESSOR) injection 5 mg  5 mg Intravenous Q4H PRN Fritzi Mandes, MD   5 mg at 01/16/19 1855  . metoprolol tartrate (LOPRESSOR) tablet 25 mg  25 mg Oral BID Fritzi Mandes, MD   25 mg at 01/24/19 0955  . neomycin-bacitracin-polymyxin (NEOSPORIN) ointment packet 1 application  1 application Topical QID PRN Fritzi Mandes, MD   1 application at 68/37/29 1042  . ondansetron (ZOFRAN) tablet 4 mg  4 mg Oral Q6H PRN Fritzi Mandes, MD       Or  . ondansetron [facility]) injection 4 mg  4 mg Intravenous Q6H PRN Fritzi Mandes, MD      . pantoprazole (PROTONIX) EC tablet 40 mg  40 mg Oral Daily Pyreddy, Reatha Harps, MD   40 mg at 01/24/19 0955  . polyethylene glycol (MIRALAX / GLYCOLAX) packet 17 g  17 g Oral Daily Pyreddy, Reatha Harps, MD   17 g at 01/24/19 0957  . sodium chloride flush (NS) 0.9 % injection 3 mL  3 mL Intravenous Q12H Pyreddy, Reatha Harps, MD   3 mL at 01/22/19 2308  . sodium chloride flush (NS) 0.9 % injection 3 mL  3 mL Intravenous PRN Pyreddy, Reatha Harps, MD      . sodium phosphate (FLEET) 7-19 GM/118ML enema 1 enema  1 enema Rectal Daily PRN Saundra Shelling, MD   1 enema at 01/20/19 1400  . vitamin B-12 (CYANOCOBALAMIN) tablet 500 mcg  500 mcg Oral Daily Fritzi Mandes, MD   500 mcg at 01/24/19 0211     Discharge Medications: Please see discharge summary for a list of discharge medications.  Relevant Imaging Results:  Relevant Lab Results:   Additional Information SS# [id number]  [name]

## 2019-01-24 NOTE — Discharge Summary (Addendum)
[facility] [place] NAME: Jennifer Klein    MR#:  [id number]  DATE OF BIRTH:  1941/09/24  DATE OF ADMISSION:  01/16/2019 ADMITTING PHYSICIAN: Fritzi Mandes, MD  DATE OF DISCHARGE: No discharge date for patient encounter.  PRIMARY CARE PHYSICIAN: Arnetha Courser, MD    ADMISSION DIAGNOSIS:  Hypernatremia [E87.0] Left-sided epistaxis [R04.0]  DISCHARGE DIAGNOSIS:  Active Problems:   Epistaxis   Acute post-hemorrhagic anemia   SECONDARY DIAGNOSIS:   Past Medical History:  Diagnosis Date  . Alzheimer's dementia with behavioral disturbance ([place]) 05/04/2016  . Alzheimer's dementia without behavioral disturbance ([place]) 05/04/2016  . B12 deficiency 05/06/2016  . Gout   . Hemorrhoids   . Hyperlipidemia LDL goal <100 05/05/2016  . Hypertension   . Memory loss   . PAF (paroxysmal atrial fibrillation) ([place])    a. on Xarelto; b. 48-hr Holter 2014: NSR with PAF/flutter which happened at least 2.26% of the time w/ associated tachycardia. Patient was asymptomatic; c. CHADS2VASc => 4 (HTN, age x 2, female)  . Paroxysmal atrial fibrillation ([place]) 08/07/2013  . Senile osteoporosis   . Systolic dysfunction    a. echo 08/2013: EF 50-55%, mild MR, mildly dilated LA    HOSPITAL COURSE:  78 year old female patient with history of chronic atrial fibrillation on Xarelto, advanced dementia, B12 deficiency, hyperlipidemia had a mechanical fall at assisted living facility and has a nasal septal fracture on 01/08/2019.  *Acute epistaxis left nose Resolved Anticoagulation with Xarelto held while in house  nasal septal fractures secondary to fall in mid January Nasal packing done in the emergency room, remove January 23, 2019 Case has been discussed with ENT-recommended nasal packing to continue till Monday/Keflex 7 days/nasal packing removed on Monday   *Acute blood loss anemia  Stable Probably from epistaxis GI loss unlikely Xarelto held Status post  gastroenterology evaluation Gastroenterology has discussed with patient's family, patient family do not want any endoscopy and colonoscopy and aggressive measures  *Chronic advanced dementia Supportive care  *Dehydration Resolved with IV fluids for rehydration  *Acute nausea/emesis  Resolved  Provided antiemetics PRN   *Acute hypokalemia Repleted  *Chronic constipation Continue current bowel regiment  Paliative Care with discharge  DISCHARGE CONDITIONS:   stable  CONSULTS OBTAINED:    DRUG ALLERGIES:   Allergies  Allergen Reactions  . Darvon [Propoxyphene Hcl]     As a child    DISCHARGE MEDICATIONS:   Allergies as of 01/24/2019      Reactions   Darvon [propoxyphene Hcl]    As a child      Medication List    STOP taking these medications   amoxicillin-clavulanate 875-125 MG tablet Commonly known as:  AUGMENTIN     TAKE these medications   acetaminophen 500 MG tablet Commonly known as:  TYLENOL Take 500 mg by mouth every 6 (six) hours as needed.   allopurinol 100 MG tablet Commonly known as:  ZYLOPRIM Take 1 tablet (100 mg total) by mouth daily.   atorvastatin 20 MG tablet Commonly known as:  LIPITOR TAKE 1 TABLET BY MOUTH AT BEDTIME What changed:  when to take this   busPIRone 5 MG tablet Commonly known as:  BUSPAR TAKE 1 TABLET BY MOUTH 2 TIMES DAILY FORDEPRESSION What changed:  See the new instructions.   cephALEXin 500 MG capsule Commonly known as:  KEFLEX Take 1 capsule (500 mg total) by mouth every 8 (eight) hours.   cholecalciferol 25 MCG (1000 UT) tablet Commonly known  as:  VITAMIN D TAKE 1 TABLET BY MOUTH ONCE DAILY   donepezil 10 MG tablet Commonly known as:  ARICEPT TAKE 1 TABLET BY MOUTH BEDTIME What changed:    how much to take  how to take this  when to take this   guaifenesin 100 MG/5ML syrup Commonly known as:  ROBITUSSIN Take 200 mg by mouth every 6 (six) hours as needed for cough.   loperamide 2 MG  capsule Commonly known as:  IMODIUM Take 2 mg by mouth as needed for diarrhea or loose stools.   LORazepam 0.5 MG tablet Commonly known as:  ATIVAN Take 0.5 tablets (0.25 mg total) by mouth 2 (two) times daily.   magnesium hydroxide 400 MG/5ML suspension Commonly known as:  MILK OF MAGNESIA Take 30 mLs by mouth daily as needed for mild constipation.   memantine 10 MG tablet Commonly known as:  NAMENDA Take 1 tablet (10 mg total) by mouth 2 (two) times daily.   metoprolol tartrate 25 MG tablet Commonly known as:  LOPRESSOR Take 1 tablet (25 mg total) by mouth 2 (two) times daily.   MI-ACID 200-200-20 MG/5ML suspension Generic drug:  alum & mag hydroxide-simeth Take 30 mLs by mouth every 6 (six) hours as needed for indigestion or heartburn.   neomycin-bacitracin-polymyxin [phone] ointment Apply topically 4 (four) times daily as needed.   pantoprazole 40 MG tablet Commonly known as:  PROTONIX Take 1 tablet (40 mg total) by mouth daily.   Rivaroxaban 15 MG Tabs tablet Commonly known as:  XARELTO Take 1 tablet (15 mg total) by mouth daily with supper. Start taking on January 28, 2019   TAKE 1 TABLET BY MOUTH DAILY WITH SUPPER What changed:    how much to take  how to take this  when to take this  additional instructions   vitamin B-12 500 MCG tablet Commonly known as:  CYANOCOBALAMIN Take 500 mcg by mouth daily.        DISCHARGE INSTRUCTIONS:    If you experience worsening of your admission symptoms, develop shortness of breath, life threatening emergency, suicidal or homicidal thoughts you must seek medical attention immediately by calling 911 or calling your MD immediately  if symptoms less severe.  You Must read complete instructions/literature along with all the possible adverse reactions/side effects for all the Medicines you take and that have been prescribed to you. Take any new Medicines after you have completely understood and accept all the possible  adverse reactions/side effects.   Please note  You were cared for by a hospitalist during your hospital stay. If you have any questions about your discharge medications or the care you received while you were in the hospital after you are discharged, you can call the unit and asked to speak with the hospitalist on call if the hospitalist that took care of you is not available. Once you are discharged, your primary care physician will handle any further medical issues. Please note that NO REFILLS for any discharge medications will be authorized once you are discharged, as it is imperative that you return to your primary care physician (or establish a relationship with a primary care physician if you do not have one) for your aftercare needs so that they can reassess your need for medications and monitor your lab values.    Today   CHIEF COMPLAINT:   Chief Complaint  Patient presents with  . Epistaxis    HISTORY OF PRESENT ILLNESS:  78 y.o. female with a known history of  chronic atrial fibrillation on Xarelto, severe advanced dementia, B12 deficiency, hyperlipidemia comes to the emergency room from assisted living when she was found to have epistaxis as noted by staff at the facility. Patient had a mechanical fall and was seen in the emergency room where she was found to have the nasal septum fracture 119 2020 after she had a mechanical fall.  Left knee air packing was done by ER physician. ER physician spoke with ENT Dr. Tami Ribas who recommended called ENT if bleeding beakers else continue the packing and prophylactic antibiotic  Patient was ready to be sent back to the facility however she started having vomiting and had some coffee ground emesis along with tachycardia and heart rate went into the 120s.  She received IV fluids, IV metoprolol. Currently she is hemodynamically stable with heart rate in the one tends. She is being admitted with acute on chronic a fib, epistaxis and possible  G.I. bleed.  pt also noted to have couple dark blackish colored stools while in the emergency room.  Patient has advanced dementia unable to give any history review of systems.  Spoke with patient's been Consuelo Pandy his number is [phone]   VITAL SIGNS:  Blood pressure (!) 108/52, pulse 77, temperature 97.9 F (36.6 C), resp. rate 18, height 5\' 5"  (1.651 m), weight 74.4 kg, SpO2 93 %.  I/O:    Intake/Output Summary (Last 24 hours) at 01/24/2019 1048 Last data filed at 01/24/2019 1017 Gross per 24 hour  Intake 240 ml  Output 850 ml  Net -610 ml    PHYSICAL EXAMINATION:  GENERAL:  78 y.o.-year-old patient lying in the bed with no acute distress.  EYES: Pupils equal, round, reactive to light and accommodation. No scleral icterus. Extraocular muscles intact.  HEENT: Head atraumatic, normocephalic. Oropharynx and nasopharynx clear.  NECK:  Supple, no jugular venous distention. No thyroid enlargement, no tenderness.  LUNGS: Normal breath sounds bilaterally, no wheezing, rales,rhonchi or crepitation. No use of accessory muscles of respiration.  CARDIOVASCULAR: S1, S2 normal. No murmurs, rubs, or gallops.  ABDOMEN: Soft, non-tender, non-distended. Bowel sounds present. No organomegaly or mass.  EXTREMITIES: No pedal edema, cyanosis, or clubbing.  NEUROLOGIC: Cranial nerves II through XII are intact. Muscle strength 5/5 in all extremities. Sensation intact. Gait not checked.  PSYCHIATRIC: The patient is alert and oriented x 3.  SKIN: No obvious rash, lesion, or ulcer.   DATA REVIEW:   CBC Recent Labs  Lab 01/19/19 0955  01/21/19 0450  WBC 7.6  --   --   HGB 7.7*   < > 8.5*  HCT 24.2*   < > 26.7*  PLT 179  --   --    < > = values in this interval not displayed.    Chemistries  Recent Labs  Lab 01/19/19 0955  01/21/19 0450  NA 143  --   --   K 3.4*   < > 3.6  CL 115*  --   --   CO2 25  --   --   GLUCOSE 107*  --   --   BUN 15  --   --   CREATININE 0.76  --   --    CALCIUM 7.7*  --   --    < > = values in this interval not displayed.    Cardiac Enzymes No results for input(s): TROPONINI in the last 168 hours.  Microbiology Results  Results for orders placed or performed during the hospital encounter of 01/16/19  MRSA  PCR Screening     Status: None   Collection Time: 01/18/19  7:52 AM  Result Value Ref Range Status   MRSA by PCR NEGATIVE NEGATIVE Final    Comment:        The GeneXpert MRSA Assay (FDA approved for NASAL specimens only), is one component of a comprehensive MRSA colonization surveillance program. It is not intended to diagnose MRSA infection nor to guide or monitor treatment for MRSA infections. Performed at [facility], [address], [place], [place] [address]     RADIOLOGY:  No results found.  EKG:   Orders placed or performed during the hospital encounter of 01/16/19  . ED EKG  . ED EKG      Management plans discussed with the patient, family and they are in agreement.  CODE STATUS:     Code Status Orders  (From admission, onward)         Start     Ordered   01/16/19 1603  Do not attempt resuscitation (DNR)  Continuous    Question Answer Comment  In the event of cardiac or respiratory ARREST Do not call a "code blue"   In the event of cardiac or respiratory ARREST Do not perform Intubation, CPR, defibrillation or ACLS   In the event of cardiac or respiratory ARREST Use medication by any route, position, wound care, and other measures to relive pain and suffering. May use oxygen, suction and manual treatment of airway obstruction as needed for comfort.      01/16/19 1602        Code Status History    Date Active Date Inactive Code Status Order ID Comments User Context   06/24/2018 0013 06/27/2018 2111 Full Code [id number]  Amelia Jo, MD Inpatient      TOTAL TIME TAKING CARE OF THIS PATIENT: 40 minutes.    Avel Peace Salary M.D on 01/24/2019 at 10:48 AM  Between 7am to 6pm - Pager -  [phone]  After 6pm go to [email or web address] - password [email or web address] [facility]  Office  [phone]  CC: Primary care physician; Arnetha Courser, MD   Note: This dictation was prepared with Dragon dictation along with smaller phrase technology. Any transcriptional errors that result from this process are unintentional.

## 2019-01-24 NOTE — Clinical Social Work Placement (Signed)
   CLINICAL SOCIAL WORK PLACEMENT  NOTE  Date:  01/24/2019  Patient Details  Name: Jennifer Klein MRN: [id number] Date of Birth: Aug 26, 1941  Clinical Social Work is seeking post-discharge placement for this patient at the [place] level of care (*CSW will initial, date and re-position this form in  chart as items are completed):  Yes   Patient/family provided with [facility] Work Department's list of facilities offering this level of care within the geographic area requested by the patient (or if unable, by the patient's family).  Yes   Patient/family informed of their freedom to choose among providers that offer the needed level of care, that participate in Medicare, Medicaid or managed care program needed by the patient, have an available bed and are willing to accept the patient.  Yes   Patient/family informed of [facility]'s ownership interest in [facility] and [facility], as well as of the fact that they are under no obligation to receive care at these facilities.  PASRR submitted to EDS on 01/24/19     PASRR number received on       Existing PASRR number confirmed on 01/24/19     FL2 transmitted to all facilities in geographic area requested by pt/family on 01/24/19     FL2 transmitted to all facilities within larger geographic area on       Patient informed that his/her managed care company has contracts with or will negotiate with certain facilities, including the following:        Yes   Patient/family informed of bed offers received.  Patient chooses bed at [facility]     Physician recommends and patient chooses bed at      Patient to be transferred to [facility] on 01/24/19.  Patient to be transferred to facility by [facility]     Patient family notified on 01/24/19 of transfer.  Name of family member notified:  Patient's husband Yasaman Kolek     PHYSICIAN Please sign FL2, Please sign  DNR     Additional Comment:    _______________________________________________ Ross Ludwig, LCSWA 01/24/2019, 4:50 PM

## 2019-01-24 NOTE — Progress Notes (Signed)
Patient transferred to [facility] via EMT.  This RN attempted to call [facility] to give report and no answer x2 attempts.

## 2019-01-24 NOTE — Progress Notes (Signed)
Per CSW Evette Cristal, patient will be discharging to [facility] for STR with Palliative to follow. Referral updated, discharge summary faxed.  Flo Shanks RN, BSN, [place] [facility], hospital Liaison [phone]

## 2019-01-24 NOTE — Clinical Social Work Note (Addendum)
CSW received phone call from [place] at [facility] care and she said they will be here to assess patient today.  CSW spoke to patient's husband, and he would prefer that patient return back to [facility] instead of going to SNF.  If [facility] says they can't take patient back without short term rehab, they would prefer to go to [facility].  Patient's husband would like CSW to start bed search in case [facility] is unable to accept patient back.  Jones Broom. Wickes, MSW, [place]  01/24/2019 10:20 AM

## 2019-01-25 ENCOUNTER — Inpatient Hospital Stay: Payer: Medicare Other | Admitting: Family Medicine

## 2019-01-26 DIAGNOSIS — E785 Hyperlipidemia, unspecified: Secondary | ICD-10-CM | POA: Diagnosis not present

## 2019-01-26 DIAGNOSIS — R040 Epistaxis: Secondary | ICD-10-CM | POA: Diagnosis not present

## 2019-01-26 DIAGNOSIS — D649 Anemia, unspecified: Secondary | ICD-10-CM | POA: Diagnosis not present

## 2019-01-26 DIAGNOSIS — I10 Essential (primary) hypertension: Secondary | ICD-10-CM | POA: Diagnosis not present

## 2019-01-26 DIAGNOSIS — F0390 Unspecified dementia without behavioral disturbance: Secondary | ICD-10-CM | POA: Diagnosis not present

## 2019-01-26 DIAGNOSIS — I4891 Unspecified atrial fibrillation: Secondary | ICD-10-CM | POA: Diagnosis not present

## 2019-02-01 DIAGNOSIS — E785 Hyperlipidemia, unspecified: Secondary | ICD-10-CM | POA: Diagnosis not present

## 2019-02-01 DIAGNOSIS — D649 Anemia, unspecified: Secondary | ICD-10-CM | POA: Diagnosis not present

## 2019-02-01 DIAGNOSIS — F0390 Unspecified dementia without behavioral disturbance: Secondary | ICD-10-CM | POA: Diagnosis not present

## 2019-02-01 DIAGNOSIS — I10 Essential (primary) hypertension: Secondary | ICD-10-CM | POA: Diagnosis not present

## 2019-02-01 DIAGNOSIS — I4891 Unspecified atrial fibrillation: Secondary | ICD-10-CM | POA: Diagnosis not present

## 2019-02-01 DIAGNOSIS — R040 Epistaxis: Secondary | ICD-10-CM | POA: Diagnosis not present

## 2019-02-07 ENCOUNTER — Ambulatory Visit: Payer: Self-pay

## 2019-02-07 ENCOUNTER — Emergency Department
Admission: EM | Admit: 2019-02-07 | Discharge: 2019-02-07 | Disposition: A | Discharge status: Home / Self Care | Payer: Medicare Other | Attending: Emergency Medicine | Admitting: Emergency Medicine

## 2019-02-07 ENCOUNTER — Emergency Department: Payer: Medicare Other

## 2019-02-07 ENCOUNTER — Other Ambulatory Visit: Payer: Self-pay

## 2019-02-07 ENCOUNTER — Encounter: Payer: Self-pay | Admitting: Emergency Medicine

## 2019-02-07 DIAGNOSIS — F0280 Dementia in other diseases classified elsewhere without behavioral disturbance: Secondary | ICD-10-CM | POA: Insufficient documentation

## 2019-02-07 DIAGNOSIS — R279 Unspecified lack of coordination: Secondary | ICD-10-CM | POA: Diagnosis not present

## 2019-02-07 DIAGNOSIS — R5381 Other malaise: Secondary | ICD-10-CM | POA: Diagnosis not present

## 2019-02-07 DIAGNOSIS — N183 Chronic kidney disease, stage 3 (moderate): Secondary | ICD-10-CM | POA: Diagnosis not present

## 2019-02-07 DIAGNOSIS — I129 Hypertensive chronic kidney disease with stage 1 through stage 4 chronic kidney disease, or unspecified chronic kidney disease: Secondary | ICD-10-CM | POA: Insufficient documentation

## 2019-02-07 DIAGNOSIS — Z87891 Personal history of nicotine dependence: Secondary | ICD-10-CM | POA: Diagnosis not present

## 2019-02-07 DIAGNOSIS — R404 Transient alteration of awareness: Secondary | ICD-10-CM | POA: Diagnosis not present

## 2019-02-07 DIAGNOSIS — G309 Alzheimer's disease, unspecified: Secondary | ICD-10-CM | POA: Insufficient documentation

## 2019-02-07 DIAGNOSIS — R55 Syncope and collapse: Secondary | ICD-10-CM | POA: Insufficient documentation

## 2019-02-07 DIAGNOSIS — Z743 Need for continuous supervision: Secondary | ICD-10-CM | POA: Diagnosis not present

## 2019-02-07 LAB — URINALYSIS, COMPLETE (UACMP) WITH MICROSCOPIC
Bacteria, UA: NONE SEEN
Bilirubin Urine: NEGATIVE
Glucose, UA: NEGATIVE mg/dL
Hgb urine dipstick: NEGATIVE
KETONES UR: NEGATIVE mg/dL
Leukocytes,Ua: NEGATIVE
Nitrite: NEGATIVE
Protein, ur: NEGATIVE mg/dL
Specific Gravity, Urine: 1.026 (ref 1.005–1.030)
pH: 5 (ref 5.0–8.0)

## 2019-02-07 LAB — COMPREHENSIVE METABOLIC PANEL
ALBUMIN: 3.6 g/dL (ref 3.5–5.0)
ALT: 13 U/L (ref 0–44)
AST: 17 U/L (ref 15–41)
Alkaline Phosphatase: 85 U/L (ref 38–126)
Anion gap: 9 (ref 5–15)
BUN: 20 mg/dL (ref 8–23)
CO2: 21 mmol/L — ABNORMAL LOW (ref 22–32)
Calcium: 8.6 mg/dL — ABNORMAL LOW (ref 8.9–10.3)
Chloride: 111 mmol/L (ref 98–111)
Creatinine, Ser: 1 mg/dL (ref 0.44–1.00)
GFR calc Af Amer: >60 mL/min (ref >60)
GFR calc non Af Amer: 54 mL/min — ABNORMAL LOW (ref >60)
GLUCOSE: 116 mg/dL — AB (ref 70–99)
POTASSIUM: 4 mmol/L (ref 3.5–5.1)
Sodium: 141 mmol/L (ref 135–145)
Total Bilirubin: 0.6 mg/dL (ref 0.3–1.2)
Total Protein: 6.4 g/dL — ABNORMAL LOW (ref 6.5–8.1)

## 2019-02-07 LAB — CBC
HCT: 31.2 % — ABNORMAL LOW (ref 36.0–46.0)
Hemoglobin: 9.6 g/dL — ABNORMAL LOW (ref 12.0–15.0)
MCH: 27.7 pg (ref 26.0–34.0)
MCHC: 30.8 g/dL (ref 30.0–36.0)
MCV: 90.2 fL (ref 80.0–100.0)
Platelets: 358 10*3/uL (ref 150–400)
RBC: 3.46 MIL/uL — ABNORMAL LOW (ref 3.87–5.11)
RDW: 14.3 % (ref 11.5–15.5)
WBC: 12.1 10*3/uL — ABNORMAL HIGH (ref 4.0–10.5)
nRBC: 0 % (ref 0.0–0.2)

## 2019-02-07 LAB — TROPONIN I: Troponin I: <0.03 ng/mL (ref <0.03)

## 2019-02-07 LAB — GLUCOSE, CAPILLARY: Glucose-Capillary: 80 mg/dL (ref 70–99)

## 2019-02-07 MED ORDER — SODIUM CHLORIDE 0.9 % IV BOLUS
1000.0000 mL | Freq: Once | INTRAVENOUS | Status: AC
  Administered 2019-02-07: 1000 mL via INTRAVENOUS

## 2019-02-07 NOTE — ED Notes (Signed)
ac[facility] to transport to [facility]

## 2019-02-07 NOTE — ED Triage Notes (Signed)
Pt to ED via [facility] from [facility] with c/o witnessed syncopal episode at the table. Per EMS episode only lasted several seconds. Per EMS pt did not fall, did not hit her head.   VSS CBG 126

## 2019-02-07 NOTE — ED Notes (Signed)
[facility] made aware of discharge. Per employee no transportation at this time. Called [employer]PO[employer] husband Jennifer Klein. Per husband, unable to pick up because unable to drive at night and no other next to kin able to transport. Advised Jennifer Klein he might get a bill from [facility]. Per Jennifer Klein, "That will be ok." [facility] notified for transport to [facility].

## 2019-02-07 NOTE — ED Notes (Signed)
Urine obtained by straight cath with this Probation officer and ED tech Sarah. Patient tolerated with no issues.

## 2019-02-07 NOTE — Telephone Encounter (Signed)
She needs to go to the ER NOW

## 2019-02-07 NOTE — Discharge Instructions (Addendum)
Please make sure Jennifer Klein is drinking plenty of fluid to stay well-hydrated.  Please make sure she eats small regular healthy meals throughout the day.  Please have her follow-up with her primary care physician, as well as the cardiologist, who may want to put an event monitor on her to make sure she is not having abnormal rhythms that come and go.  Return to the emergency department if you develop severe pain, lightheadedness or fainting, fever, or any other symptoms concerning to you.

## 2019-02-07 NOTE — ED Notes (Signed)
Patient laying in bed calm with no issues. Will continue to monitor.

## 2019-02-07 NOTE — Telephone Encounter (Signed)
[place] notified

## 2019-02-07 NOTE — Telephone Encounter (Signed)
  Jennifer from [place] from [facility] called to report pt appeared more pale than usual and that her skin felt cool. Anderson Malta stated that she is walking but appears "slumped" over more. Pt's BP 138/70 and HR 70. Anderson Malta stated that pt's mouth is moist. Pt was recently taken off of Xarelto at last hospitalization due to hemoccult positive stools and nose bleed.  During call, pt was reassessed by Anderson Malta who stated that her color has improved, her skin temperature is warm except for her hands. Called PCP office and spoke with Roselyn Reef. Asked to send note to office for Dr lada to review.  Reason for Disposition . [1] Worsening confusion AND [2] new onset (hours to 3 days)  Answer Assessment - Initial Assessment Questions 1. MAIN CONCERN OR SYMPTOM:  "What is your main concern right now?" "What questions do you have?" "What's the main symptom you're worried about?" (e.g., confusion, memory loss)      more confused and walking slumped over and appears  weaker 2. ONSET:  "When did the symptom start (or worsen)?" (minutes, hours, days, weeks)     Today  BP 138/70 HR 70 3. BETTER-SAME-WORSE: "Are you getting better, staying the same, or getting worse compared to the day you were discharged?" Pt unable to answer 4.  DIAGNOSIS: "Was patient diagnosed with dementia by a doctor?" If yes, "When?" (e.g., days, months, years ago) yes 5.  MEDICATION: "Has there been any change in medications recently?" (e.g., narcotics, antihistamines, benzodiazepines, etc.) No- off xarelto- on pro-stat SF amino acid protein drink, vitamin C, zinc 6.  OTHER SYMPTOMS: "Are there any other symptoms?" (e.g., fever, cough, pain, falling)     Pale and cool -assistants put pt to bed and color now  looks better and is warmer except for hands.  7. SUPPORT: Document living circumstances and support (e.g., family, nursing home)     Assistive living memory care facility  Protocols used: [place]

## 2019-02-07 NOTE — ED Provider Notes (Signed)
[facility] Emergency Department Provider Note  ____________________________________________  Time seen: Approximately 5:59 PM  I have reviewed the triage vital signs and the nursing notes.   HISTORY  Chief Complaint Loss of Consciousness    HPI Jennifer Klein is a 78 y.o. female with a history of severe Alzheimer's dementia, HTN, HL, paroxysmal A. fib on Xarelto, presenting for syncope.  The patient is unable to give any history; her only verbal responses are singing unrelated to my questions.  The report from EMS is that the patient was sitting at the table when she had a brief syncopal episode lasting several seconds without any tonic-clonic activity.  No obvious injury from her syncope.  Her vital signs were stable en route.  Her nurses did note malodorous urine from her diaper.  Past Medical History:  Diagnosis Date  . Alzheimer's dementia with behavioral disturbance ([place]) 05/04/2016  . Alzheimer's dementia without behavioral disturbance ([place]) 05/04/2016  . B12 deficiency 05/06/2016  . Gout   . Hemorrhoids   . Hyperlipidemia LDL goal <100 05/05/2016  . Hypertension   . Memory loss   . PAF (paroxysmal atrial fibrillation) ([place])    a. on Xarelto; b. 48-hr Holter 2014: NSR with PAF/flutter which happened at least 2.26% of the time w/ associated tachycardia. Patient was asymptomatic; c. CHADS2VASc => 4 (HTN, age x 2, female)  . Paroxysmal atrial fibrillation ([place]) 08/07/2013  . Senile osteoporosis   . Systolic dysfunction    a. echo 08/2013: EF 50-55%, mild MR, mildly dilated LA    Patient Active Problem List   Diagnosis Date Noted  . Acute post-hemorrhagic anemia   . Epistaxis 01/16/2019  . Sepsis ([place]) 06/23/2018  . Fall 09/10/2017  . PAD (peripheral artery disease) ([place]) 06/22/2017  . Chronic venous insufficiency 06/22/2017  . CKD (chronic kidney disease) stage 3, GFR 30-59 ml/min (HCC) 09/04/2016  . B12 deficiency 05/06/2016  . Hyperlipidemia LDL  goal <100 05/05/2016  . Alzheimer's dementia without behavioral disturbance ([place]) 05/04/2016  . Medication monitoring encounter 05/04/2016  . Barrett's esophagus 11/09/2015  . Paroxysmal atrial fibrillation ([place]) 08/07/2013  . Osteopenia 08/08/2012  . Hypertension 06/03/2012  . Gout 06/03/2012    Past Surgical History:  Procedure Laterality Date  . TUBAL LIGATION      Current Outpatient Rx  . Order #: [id number] Class: Historical Med  . Order #: [id number] Class: Normal  . Order #: [id number] Class: Historical Med  . Order #: [id number] Class: Normal  . Order #: [id number] Class: Normal  . Order #: [id number] Class: Print  . Order #: [id number] Class: Normal  . Order #: [id number] Class: Normal  . Order #: [id number] Class: Historical Med  . Order #: [id number] Class: Historical Med  . Order #: [id number] Class: Print  . Order #: [id number] Class: Historical Med  . Order #: [id number] Class: Normal  . Order #: [id number] Class: Normal  . Order #: [id number] Class: Historical Med  . Order #: [id number] Class: Normal  . Order #: [id number] Class: Print  . Order #: [id number] Class: Historical Med    Allergies Darvon [propoxyphene hcl]  Family History  Problem Relation Age of Onset  . Heart disease Father   . Gout Brother     Social History Social History   Tobacco Use  . Smoking status: Former Smoker    Packs/day: 0.25    Years: 1.00    Pack years: 0.25    Types: Cigarettes  . Smokeless tobacco: Never Used  Substance Use Topics  . Alcohol use: No    Alcohol/week: 0.0 standard drinks  .  Drug use: No    Review of Systems Unable to obtain due to patient dementia.   ____________________________________________   PHYSICAL EXAM:  VITAL SIGNS: ED Triage Vitals [02/07/19 1743]  Enc Vitals Group     BP      Pulse      Resp      Temp      Temp src      SpO2      Weight 164 lb (74.4 kg)     Height 5\' 5"  (1.651 m)     Head Circumference      Peak Flow      Pain Score      Pain Loc       Pain Edu?      Excl. in [place]?     Constitutional: The patient is alert, makes good eye contact, but is nonverbal except for intermittent singing.  GCS is 15.  The patient is able to follow most commands. Eyes: Conjunctivae are normal.  EOMI. PERRLA.  No raccoon eyes.  No scleral icterus. Head: Atraumatic.  No battle sign. Nose: No congestion/rhinnorhea. Mouth/Throat: Mucous membranes are moist.  Neck: No stridor.  Supple.  No JVD.  No meningismus. Cardiovascular: Normal rate, regular rhythm. No murmurs, rubs or gallops.  Respiratory: Normal respiratory effort.  No accessory muscle use or retractions. Lungs CTAB.  No wheezes, rales or ronchi. Gastrointestinal: Soft, nontender and nondistended.  No guarding or rebound.  No peritoneal signs. Musculoskeletal: No LE edema. No ttp in the calves or palpable cords.  Negative Homan's sign. Neurologic: Alert.  Singing..  Face and smile are symmetric.  EOMI. PERRLA.  5 out of 5 bilateral grip strength, biceps, triceps.  I am unable to get her to give me full lower extremity motor exam; she does move both legs spontaneously.   Skin:  Skin is warm, dry and intact. No rash noted. Psychiatric: Mood and affect are normal. ____________________________________________   LABS (all labs ordered are listed, but only abnormal results are displayed)  Labs Reviewed  CBC - Abnormal; Notable for the following components:      Result Value   WBC 12.1 (*)    RBC 3.46 (*)    Hemoglobin 9.6 (*)    HCT 31.2 (*)    All other components within normal limits  COMPREHENSIVE METABOLIC PANEL - Abnormal; Notable for the following components:   CO2 21 (*)    Glucose, Bld 116 (*)    Calcium 8.6 (*)    Total Protein 6.4 (*)    GFR calc non Af Amer 54 (*)    All other components within normal limits  URINALYSIS, COMPLETE (UACMP) WITH MICROSCOPIC - Abnormal; Notable for the following components:   Color, Urine YELLOW (*)    APPearance CLEAR (*)    All other  components within normal limits  TROPONIN I  GLUCOSE, CAPILLARY  CBG MONITORING, ED   ____________________________________________  EKG  ED ECG REPORT I, Anne-Caroline Mariea Clonts, the attending physician, personally viewed and interpreted this ECG.   Date: 02/07/2019  EKG Time: 1755  Rate: 95  Rhythm: afib, rate controlled  Axis: normal  Intervals:prolonged QTc  ST&T Change: no STEMI  ____________________________________________  RADIOLOGY  Dg Chest 2 View  Result Date: 02/07/2019 CLINICAL DATA:  Syncope and altered mental status. EXAM: CHEST - 2 VIEW COMPARISON:  01/08/2019 FINDINGS: The heart size and mediastinal contours are within normal limits. There is no evidence of pulmonary edema, consolidation, pneumothorax, nodule or pleural fluid. The visualized skeletal  structures are unremarkable. IMPRESSION: No active cardiopulmonary disease. Electronically Signed   By: Aletta Edouard M.D.   On: 02/07/2019 18:57   Ct Head Wo Contrast  Result Date: 02/07/2019 CLINICAL DATA:  78 year old female with syncopal episode. EXAM: CT HEAD WITHOUT CONTRAST TECHNIQUE: Contiguous axial images were obtained from the base of the skull through the vertex without intravenous contrast. COMPARISON:  Head CT 01/08/2019 and earlier. FINDINGS: Brain: Patchy bilateral cerebral white matter hypodensity appears stable. Chronic right greater than left basal ganglia lacunar infarcts. Small chronic infarct in the left superior cerebellum. No midline shift, ventriculomegaly, mass effect, evidence of mass lesion, intracranial hemorrhage or evidence of cortically based acute infarction. Vascular: Calcified atherosclerosis at the skull base. Skull: Negative. Sinuses/Orbits: Regressed or resolved left maxillary sinus fluid level since January. Other Visualized paranasal sinuses and mastoids are stable and well pneumatized. Other: Negative orbits and scalp soft tissues. IMPRESSION: 1. No acute intracranial abnormality. 2.  Stable CT appearance of chronic small vessel ischemia since January. Electronically Signed   By: Genevie Ann M.D.   On: 02/07/2019 19:01    ____________________________________________   PROCEDURES  Procedure(s) performed: None  Procedures  Critical Care performed: No ____________________________________________   INITIAL IMPRESSION / ASSESSMENT AND PLAN / ED COURSE  Pertinent labs & imaging results that were available during my care of the patient were reviewed by me and considered in my medical decision making (see chart for details).  78 y.o. female with a history of dementia and multiple chronic comorbidities presenting for brief syncopal episode while seated at a table.  Overall, the patient is hemodynamically stable here.  Will check a CBG.  We will also look for evidence of infection by getting a UA and a chest x-ray.  Basic laboratory studies will also be obtained including a troponin.  Her EKG does not show any ischemia or arrhythmia; she is in atrial fibrillation which is chronic for her.  A CT of the head has also been ordered; there are no focal neurologic deficits on her exam, grossly, although the exam is limited due to her dementia.  A syncopal episode while seated is concerning for an intermittent arrhythmia.  Plan reevaluation for final disposition.  ----------------------------------------- 8:39 PM on 02/07/2019 -----------------------------------------  The patient has remained hemodynamically stable in the emergency department without any acute syncopal episodes or other abnormalities.  Her work-up has been reassuring.  She did have a mild elevation in her white blood cell count but no clinical evidence for infection and her urinalysis does not show UTI.  Her electrolytes are reassuring.  Her troponin is negative.  Her blood glucose is normal.  Her CT head does not show any acute intracranial process and her chest x-ray does not show any cardiopulmonary abnormalities.  At  this time, the patient be safe for discharge home.  I have written in her discharge paperwork that she should follow-up with cardiology in case an event monitor or Holter monitor would be warranted to rule out intermittent arrhythmia.  Follow-up instructions as well as return precautions were outlined in her discharge paperwork.  ____________________________________________  FINAL CLINICAL IMPRESSION(S) / ED DIAGNOSES  Final diagnoses:  Syncope, unspecified syncope type         NEW MEDICATIONS STARTED DURING THIS VISIT:  New Prescriptions   No medications on file      Eula Listen, MD 02/07/19 2040

## 2019-02-07 NOTE — ED Notes (Signed)
Attempted to cath patient, no urine noted. Pt placed in clean brief at this time.

## 2019-02-08 ENCOUNTER — Telehealth: Payer: Self-pay | Admitting: Family Medicine

## 2019-02-08 DIAGNOSIS — E538 Deficiency of other specified B group vitamins: Secondary | ICD-10-CM | POA: Diagnosis not present

## 2019-02-08 DIAGNOSIS — M8000XD Age-related osteoporosis with current pathological fracture, unspecified site, subsequent encounter for fracture with routine healing: Secondary | ICD-10-CM | POA: Diagnosis not present

## 2019-02-08 DIAGNOSIS — Z87891 Personal history of nicotine dependence: Secondary | ICD-10-CM | POA: Diagnosis not present

## 2019-02-08 DIAGNOSIS — K219 Gastro-esophageal reflux disease without esophagitis: Secondary | ICD-10-CM | POA: Diagnosis not present

## 2019-02-08 DIAGNOSIS — F0281 Dementia in other diseases classified elsewhere with behavioral disturbance: Secondary | ICD-10-CM | POA: Diagnosis not present

## 2019-02-08 DIAGNOSIS — I482 Chronic atrial fibrillation, unspecified: Secondary | ICD-10-CM | POA: Diagnosis not present

## 2019-02-08 DIAGNOSIS — Z9181 History of falling: Secondary | ICD-10-CM | POA: Diagnosis not present

## 2019-02-08 DIAGNOSIS — Z7901 Long term (current) use of anticoagulants: Secondary | ICD-10-CM | POA: Diagnosis not present

## 2019-02-08 DIAGNOSIS — G309 Alzheimer's disease, unspecified: Secondary | ICD-10-CM | POA: Diagnosis not present

## 2019-02-08 DIAGNOSIS — K589 Irritable bowel syndrome without diarrhea: Secondary | ICD-10-CM | POA: Diagnosis not present

## 2019-02-08 DIAGNOSIS — F3289 Other specified depressive episodes: Secondary | ICD-10-CM | POA: Diagnosis not present

## 2019-02-08 DIAGNOSIS — E785 Hyperlipidemia, unspecified: Secondary | ICD-10-CM | POA: Diagnosis not present

## 2019-02-08 DIAGNOSIS — I10 Essential (primary) hypertension: Secondary | ICD-10-CM | POA: Diagnosis not present

## 2019-02-08 DIAGNOSIS — Z87440 Personal history of urinary (tract) infections: Secondary | ICD-10-CM | POA: Diagnosis not present

## 2019-02-08 DIAGNOSIS — M1009 Idiopathic gout, multiple sites: Secondary | ICD-10-CM | POA: Diagnosis not present

## 2019-02-08 DIAGNOSIS — F064 Anxiety disorder due to known physiological condition: Secondary | ICD-10-CM | POA: Diagnosis not present

## 2019-02-08 NOTE — Telephone Encounter (Signed)
That is fine I don't think I ordered PT anyway It looked like she did that at [facility], was able to ambulate 200 feet with assistance and no loss of balance

## 2019-02-08 NOTE — Telephone Encounter (Signed)
Copied from [place] [id number]. Topic: Quick Communication - See Telephone Encounter >> Feb 08, 2019 11:29 AM Burchel, Abbi R wrote: CRM for notification. See Telephone encounter for: 02/08/19.  Claiborne Billings PT [facility] states that she does not think pt is appropriate for PT.  Claiborne Billings states pt has orders for OT and will be evaluated by them before she is d/c.  [phone] ok to leave VM

## 2019-02-09 NOTE — Telephone Encounter (Signed)
Left detailed voicemail

## 2019-02-16 ENCOUNTER — Encounter: Payer: Self-pay | Admitting: Family Medicine

## 2019-02-17 ENCOUNTER — Telehealth: Payer: Self-pay | Admitting: Family Medicine

## 2019-02-17 NOTE — Telephone Encounter (Signed)
Copied from [place] [id number]. Topic: Quick Communication - Home Health Verbal Orders >> Feb 17, 2019 11:08 AM Scherrie Gerlach wrote: Caller/Agency: Kenney Houseman //[facility] Callback Number: [phone] They received referral and are planning to see the pt on Monday 02/20/2019

## 2019-02-17 NOTE — Telephone Encounter (Signed)
Ordered by [facility]: sent there for recurrent falls  and they are wanting PT/OT continued?  Are you willing to do orders?

## 2019-02-17 NOTE — Telephone Encounter (Signed)
We did not put in a referral, so we'll need more information from her Who ordered it? What are the diagnoses or symptoms? What they are going to do (home health, PT, OT, etc.)?  Patient will need a face-to-face with me within 30 days of the date of the order or they run the risk of not getting paid, so make sure she has appointment booked

## 2019-02-17 NOTE — Telephone Encounter (Signed)
Verbal given 

## 2019-02-17 NOTE — Telephone Encounter (Signed)
Yes, that's fine Just needed it documented in the record Thank you

## 2019-02-20 DIAGNOSIS — F3289 Other specified depressive episodes: Secondary | ICD-10-CM | POA: Diagnosis not present

## 2019-02-20 DIAGNOSIS — I480 Paroxysmal atrial fibrillation: Secondary | ICD-10-CM | POA: Diagnosis not present

## 2019-02-20 DIAGNOSIS — Z7901 Long term (current) use of anticoagulants: Secondary | ICD-10-CM | POA: Diagnosis not present

## 2019-02-20 DIAGNOSIS — F0281 Dementia in other diseases classified elsewhere with behavioral disturbance: Secondary | ICD-10-CM | POA: Diagnosis not present

## 2019-02-20 DIAGNOSIS — E785 Hyperlipidemia, unspecified: Secondary | ICD-10-CM | POA: Diagnosis not present

## 2019-02-20 DIAGNOSIS — S022XXD Fracture of nasal bones, subsequent encounter for fracture with routine healing: Secondary | ICD-10-CM | POA: Diagnosis not present

## 2019-02-20 DIAGNOSIS — M810 Age-related osteoporosis without current pathological fracture: Secondary | ICD-10-CM | POA: Diagnosis not present

## 2019-02-20 DIAGNOSIS — Z9181 History of falling: Secondary | ICD-10-CM | POA: Diagnosis not present

## 2019-02-20 DIAGNOSIS — G309 Alzheimer's disease, unspecified: Secondary | ICD-10-CM | POA: Diagnosis not present

## 2019-02-20 DIAGNOSIS — I10 Essential (primary) hypertension: Secondary | ICD-10-CM | POA: Diagnosis not present

## 2019-02-20 DIAGNOSIS — Z87440 Personal history of urinary (tract) infections: Secondary | ICD-10-CM | POA: Diagnosis not present

## 2019-02-20 DIAGNOSIS — E538 Deficiency of other specified B group vitamins: Secondary | ICD-10-CM | POA: Diagnosis not present

## 2019-02-20 DIAGNOSIS — M1009 Idiopathic gout, multiple sites: Secondary | ICD-10-CM | POA: Diagnosis not present

## 2019-02-20 NOTE — Telephone Encounter (Signed)
Darlina Guys, Rn with [facility] called to let Dr Sanda Klein know they will go out tomorrow to start services.

## 2019-02-24 ENCOUNTER — Ambulatory Visit (INDEPENDENT_AMBULATORY_CARE_PROVIDER_SITE_OTHER): Payer: Medicare Other | Admitting: Family Medicine

## 2019-02-24 ENCOUNTER — Encounter: Payer: Self-pay | Admitting: Family Medicine

## 2019-02-24 DIAGNOSIS — F0280 Dementia in other diseases classified elsewhere without behavioral disturbance: Secondary | ICD-10-CM

## 2019-02-24 DIAGNOSIS — N183 Chronic kidney disease, stage 3 unspecified: Secondary | ICD-10-CM

## 2019-02-24 DIAGNOSIS — E785 Hyperlipidemia, unspecified: Secondary | ICD-10-CM

## 2019-02-24 DIAGNOSIS — Z66 Do not resuscitate: Secondary | ICD-10-CM | POA: Diagnosis not present

## 2019-02-24 DIAGNOSIS — G309 Alzheimer's disease, unspecified: Secondary | ICD-10-CM

## 2019-02-24 DIAGNOSIS — K2270 Barrett's esophagus without dysplasia: Secondary | ICD-10-CM | POA: Diagnosis not present

## 2019-02-24 DIAGNOSIS — M1A00X0 Idiopathic chronic gout, unspecified site, without tophus (tophi): Secondary | ICD-10-CM

## 2019-02-24 NOTE — Progress Notes (Signed)
BP 108/68   Pulse 70   Temp (!) 97.3 F (36.3 C) (Oral)   Resp 12   Ht 5\' 4"  (1.626 m)   Wt 129 lb 4.8 oz (58.7 kg)   SpO2 99%   BMI 22.19 kg/m    Subjective:    Patient ID: Jennifer Klein, female    DOB: [birth year], 78 y.o.   MRN: [id number]  HPI: Jennifer Klein is a 78 y.o. female  Chief Complaint  Patient presents with  . Follow-up    HPI Here with her husband for follow-up Her dementia is advancing; she does not recognize him; she is in long-term care Had a bad fall and had to stay in the hospital, had packing in her nose Worried about her falling again; they tried to get her to do PT but she wouldn't participate They are going to try something else there to keep her from falling; she has good supportive shoes Memory care unit and safe her husband says; feeding and grooming is good there She has a hx of gout; no recent flares She is on medicine, but he really does not want to have her get labs; not interested in going back to nephrologist at this time She has Barrett's but he does not want to put her through EGD for monitoring; he wants to just continue the PPI Husband does not want her to have surgery or resuscitation Continue to treat if pneumonia or bladder infection Nothing aggressive he says; he voices desire for her to be DNR  Depression screen [facility] 2/9 02/24/2019 01/27/2018 09/10/2017 06/02/2017 05/11/2017  Decreased Interest 0 0 0 0 0  Down, Depressed, Hopeless 0 0 0 0 0  PHQ - 2 Score 0 0 0 0 0  Altered sleeping 0 - - - -  Tired, decreased energy 0 - - - -  Change in appetite 0 - - - -  Feeling bad or failure about yourself  0 - - - -  Trouble concentrating 0 - - - -  Moving slowly or fidgety/restless 0 - - - -  Suicidal thoughts 0 - - - -  PHQ-9 Score 0 - - - -  Difficult doing work/chores Not difficult at all - - - -   Fall Risk  02/24/2019 11/09/2018 10/26/2018 01/27/2018 09/10/2017  Falls in the past year? - 0 1 No Yes  Number falls in past yr: 1 - 1 - 2 or  more  Injury with Fall? 1 - 1 - No    Relevant past medical, surgical, family and social history reviewed Past Medical History:  Diagnosis Date  . Alzheimer's dementia with behavioral disturbance ([place]) 05/04/2016  . Alzheimer's dementia without behavioral disturbance ([place]) 05/04/2016  . B12 deficiency 05/06/2016  . Broken nose   . Gout   . Hemorrhoids   . Hyperlipidemia LDL goal <100 05/05/2016  . Hypertension   . Memory loss   . PAF (paroxysmal atrial fibrillation) ([place])    a. on Xarelto; b. 48-hr Holter 2014: NSR with PAF/flutter which happened at least 2.26% of the time w/ associated tachycardia. Patient was asymptomatic; c. CHADS2VASc => 4 (HTN, age x 2, female)  . Paroxysmal atrial fibrillation ([place]) 08/07/2013  . Senile osteoporosis   . Systolic dysfunction    a. echo 08/2013: EF 50-55%, mild MR, mildly dilated LA   Past Surgical History:  Procedure Laterality Date  . TUBAL LIGATION     Family History  Problem Relation Age of Onset  . Heart  disease Father   . Gout Brother    Social History   Tobacco Use  . Smoking status: Former Smoker    Packs/day: 0.25    Years: 1.00    Pack years: 0.25    Types: Cigarettes  . Smokeless tobacco: Never Used  Substance Use Topics  . Alcohol use: No    Alcohol/week: 0.0 standard drinks  . Drug use: No     [facility] Visit from [facility]/24/2019 in [facility]  AUDIT-C Score  0      Interim medical history since last visit reviewed. Allergies and medications reviewed  Review of Systems Per HPI unless specifically indicated above     Objective:    BP 108/68   Pulse 70   Temp (!) 97.3 F (36.3 C) (Oral)   Resp 12   Ht 5\' 4"  (1.626 m)   Wt 129 lb 4.8 oz (58.7 kg)   SpO2 99%   BMI 22.19 kg/m   Wt Readings from Last 3 Encounters:  02/24/19 129 lb 4.8 oz (58.7 kg)  02/07/19 164 lb (74.4 kg)  01/21/19 164 lb (74.4 kg)  MD note: husband says she lost a little bit but the weight today wasn't right hand was on the  bar; clothes are not falling off of her, not really any change in her weight that he can tell; she has a lost some over the last few years, nothing that he thinks would be cancer  Physical Exam Constitutional:      Appearance: She is well-developed.  Eyes:     General: No scleral icterus. Cardiovascular:     Rate and Rhythm: Normal rate and regular rhythm.  Pulmonary:     Effort: Pulmonary effort is normal.     Breath sounds: Normal breath sounds.  Neurological:     Mental Status: She is alert.  Psychiatric:        Behavior: Behavior is slowed and withdrawn. Behavior is not agitated or aggressive.        Cognition and Memory: Cognition is impaired. Memory is impaired.     Comments: Poor historian; says a few words, but not related to our conversation at all; they are discernable and understandable; not slurred; she is detached; flips through magazine on lap, does not particularly look at her husband or examiner     Results for orders placed or performed during the hospital encounter of 02/07/19  CBC  Result Value Ref Range   WBC 12.1 (H) 4.0 - 10.5 K/uL   RBC 3.46 (L) 3.87 - 5.11 MIL/uL   Hemoglobin 9.6 (L) 12.0 - 15.0 g/dL   HCT 31.2 (L) 36.0 - 46.0 %   MCV 90.2 80.0 - 100.0 fL   MCH 27.7 26.0 - 34.0 pg   MCHC 30.8 30.0 - 36.0 g/dL   RDW 14.3 11.5 - 15.5 %   Platelets 358 150 - 400 K/uL   nRBC 0.0 0.0 - 0.2 %  Comprehensive metabolic panel  Result Value Ref Range   Sodium 141 135 - 145 mmol/L   Potassium 4.0 3.5 - 5.1 mmol/L   Chloride 111 98 - 111 mmol/L   CO2 21 (L) 22 - 32 mmol/L   Glucose, Bld 116 (H) 70 - 99 mg/dL   BUN 20 8 - 23 mg/dL   Creatinine, Ser 1.00 0.44 - 1.00 mg/dL   Calcium 8.6 (L) 8.9 - 10.3 mg/dL   Total Protein 6.4 (L) 6.5 - 8.1 g/dL   Albumin 3.6 3.5 - 5.0  g/dL   AST 17 15 - 41 U/L   ALT 13 0 - 44 U/L   Alkaline Phosphatase 85 38 - 126 U/L   Total Bilirubin 0.6 0.3 - 1.2 mg/dL   GFR calc non Af Amer 54 (L) >60 mL/min   GFR calc Af Amer >60 >60  mL/min   Anion gap 9 5 - 15  Urinalysis, Complete w Microscopic  Result Value Ref Range   Color, Urine YELLOW (A) YELLOW   APPearance CLEAR (A) CLEAR   Specific Gravity, Urine 1.026 1.005 - 1.030   pH 5.0 5.0 - 8.0   Glucose, UA NEGATIVE NEGATIVE mg/dL   Hgb urine dipstick NEGATIVE NEGATIVE   Bilirubin Urine NEGATIVE NEGATIVE   Ketones, ur NEGATIVE NEGATIVE mg/dL   Protein, ur NEGATIVE NEGATIVE mg/dL   Nitrite NEGATIVE NEGATIVE   Leukocytes,Ua NEGATIVE NEGATIVE   RBC / HPF 0-5 0 - 5 RBC/hpf   WBC, UA 0-5 0 - 5 WBC/hpf   Bacteria, UA NONE SEEN NONE SEEN   Squamous Epithelial / LPF 0-5 0 - 5   Mucus PRESENT   Troponin I - ONCE - STAT  Result Value Ref Range   Troponin I <0.03 <0.03 ng/mL  Glucose, capillary  Result Value Ref Range   Glucose-Capillary 80 70 - 99 mg/dL      Assessment & Plan:   Problem List Items Addressed This Visit      Digestive   Barrett's esophagus    Continue PPI; she used to drink daily, fairly heavy; he thinks alcohol may have contributed perhaps; patient's husband does NOT want her to have EGD and is aware of risk of progression to cancer        Nervous and Auditory   Alzheimer's dementia without behavioral disturbance (HCC)    On medicine, not seeing neurologist; husband does not want that        Genitourinary   CKD (chronic kidney disease) stage 3, GFR 30-59 ml/min (HCC)    Husband is content with not getting labs right now; continue medicine for now, reassess        Other   Hyperlipidemia LDL goal <100    Noted, not treating aggressively; patient's husband does not want labs      Gout    No recent flares; patient's husband does not want to do any labs to monitor      DNAR (do not attempt resuscitation)    Discussed with husband; he does not want aggressive measures, DO NOT RESUSCITATE      Relevant Orders   DNR (Do Not Resuscitate)       Follow up plan: Return in about 6 months (around 08/27/2019) for follow-up visit with Dr.  Sanda Klein.  An after-visit summary was printed and given to the patient at [place].  Please see the patient instructions which may contain other information and recommendations beyond what is mentioned above in the assessment and plan.  No orders of the defined types were placed in this encounter.   Orders Placed This Encounter  Procedures  . DNR (Do Not Resuscitate)

## 2019-02-24 NOTE — Assessment & Plan Note (Signed)
On medicine, not seeing neurologist; husband does not want that

## 2019-02-24 NOTE — Assessment & Plan Note (Signed)
Husband is content with not getting labs right now; continue medicine for now, reassess

## 2019-02-24 NOTE — Assessment & Plan Note (Signed)
Continue PPI; she used to drink daily, fairly heavy; he thinks alcohol may have contributed perhaps; patient's husband does NOT want her to have EGD and is aware of risk of progression to cancer

## 2019-02-24 NOTE — Assessment & Plan Note (Signed)
Discussed with husband; he does not want aggressive measures, DO NOT RESUSCITATE

## 2019-02-24 NOTE — Assessment & Plan Note (Signed)
No recent flares; patient's husband does not want to do any labs to monitor

## 2019-02-24 NOTE — Assessment & Plan Note (Signed)
Noted, not treating aggressively; patient's husband does not want labs

## 2019-02-24 NOTE — Patient Instructions (Signed)
Return in 6 months

## 2019-03-22 DIAGNOSIS — M1009 Idiopathic gout, multiple sites: Secondary | ICD-10-CM | POA: Diagnosis not present

## 2019-03-22 DIAGNOSIS — Z7901 Long term (current) use of anticoagulants: Secondary | ICD-10-CM | POA: Diagnosis not present

## 2019-03-22 DIAGNOSIS — Z9181 History of falling: Secondary | ICD-10-CM | POA: Diagnosis not present

## 2019-03-22 DIAGNOSIS — Z87440 Personal history of urinary (tract) infections: Secondary | ICD-10-CM | POA: Diagnosis not present

## 2019-03-22 DIAGNOSIS — I10 Essential (primary) hypertension: Secondary | ICD-10-CM | POA: Diagnosis not present

## 2019-03-22 DIAGNOSIS — M810 Age-related osteoporosis without current pathological fracture: Secondary | ICD-10-CM | POA: Diagnosis not present

## 2019-03-22 DIAGNOSIS — S022XXD Fracture of nasal bones, subsequent encounter for fracture with routine healing: Secondary | ICD-10-CM | POA: Diagnosis not present

## 2019-03-22 DIAGNOSIS — F3289 Other specified depressive episodes: Secondary | ICD-10-CM | POA: Diagnosis not present

## 2019-03-22 DIAGNOSIS — E538 Deficiency of other specified B group vitamins: Secondary | ICD-10-CM | POA: Diagnosis not present

## 2019-03-22 DIAGNOSIS — I480 Paroxysmal atrial fibrillation: Secondary | ICD-10-CM | POA: Diagnosis not present

## 2019-03-22 DIAGNOSIS — F0281 Dementia in other diseases classified elsewhere with behavioral disturbance: Secondary | ICD-10-CM | POA: Diagnosis not present

## 2019-03-22 DIAGNOSIS — G309 Alzheimer's disease, unspecified: Secondary | ICD-10-CM | POA: Diagnosis not present

## 2019-03-22 DIAGNOSIS — E785 Hyperlipidemia, unspecified: Secondary | ICD-10-CM | POA: Diagnosis not present

## 2019-03-27 DIAGNOSIS — G309 Alzheimer's disease, unspecified: Secondary | ICD-10-CM | POA: Diagnosis not present

## 2019-03-27 DIAGNOSIS — I480 Paroxysmal atrial fibrillation: Secondary | ICD-10-CM | POA: Diagnosis not present

## 2019-03-27 DIAGNOSIS — M1009 Idiopathic gout, multiple sites: Secondary | ICD-10-CM | POA: Diagnosis not present

## 2019-03-27 DIAGNOSIS — F0281 Dementia in other diseases classified elsewhere with behavioral disturbance: Secondary | ICD-10-CM | POA: Diagnosis not present

## 2019-03-27 DIAGNOSIS — S022XXD Fracture of nasal bones, subsequent encounter for fracture with routine healing: Secondary | ICD-10-CM | POA: Diagnosis not present

## 2019-03-27 DIAGNOSIS — I10 Essential (primary) hypertension: Secondary | ICD-10-CM | POA: Diagnosis not present

## 2019-04-03 DIAGNOSIS — I10 Essential (primary) hypertension: Secondary | ICD-10-CM | POA: Diagnosis not present

## 2019-04-03 DIAGNOSIS — S022XXD Fracture of nasal bones, subsequent encounter for fracture with routine healing: Secondary | ICD-10-CM | POA: Diagnosis not present

## 2019-04-03 DIAGNOSIS — G309 Alzheimer's disease, unspecified: Secondary | ICD-10-CM | POA: Diagnosis not present

## 2019-04-03 DIAGNOSIS — I480 Paroxysmal atrial fibrillation: Secondary | ICD-10-CM | POA: Diagnosis not present

## 2019-04-03 DIAGNOSIS — F0281 Dementia in other diseases classified elsewhere with behavioral disturbance: Secondary | ICD-10-CM | POA: Diagnosis not present

## 2019-04-03 DIAGNOSIS — M1009 Idiopathic gout, multiple sites: Secondary | ICD-10-CM | POA: Diagnosis not present

## 2019-04-10 DIAGNOSIS — G309 Alzheimer's disease, unspecified: Secondary | ICD-10-CM | POA: Diagnosis not present

## 2019-04-10 DIAGNOSIS — I10 Essential (primary) hypertension: Secondary | ICD-10-CM | POA: Diagnosis not present

## 2019-04-10 DIAGNOSIS — I480 Paroxysmal atrial fibrillation: Secondary | ICD-10-CM | POA: Diagnosis not present

## 2019-04-10 DIAGNOSIS — S022XXD Fracture of nasal bones, subsequent encounter for fracture with routine healing: Secondary | ICD-10-CM | POA: Diagnosis not present

## 2019-04-10 DIAGNOSIS — F0281 Dementia in other diseases classified elsewhere with behavioral disturbance: Secondary | ICD-10-CM | POA: Diagnosis not present

## 2019-04-10 DIAGNOSIS — M1009 Idiopathic gout, multiple sites: Secondary | ICD-10-CM | POA: Diagnosis not present

## 2019-04-19 DIAGNOSIS — I480 Paroxysmal atrial fibrillation: Secondary | ICD-10-CM | POA: Diagnosis not present

## 2019-04-19 DIAGNOSIS — G309 Alzheimer's disease, unspecified: Secondary | ICD-10-CM | POA: Diagnosis not present

## 2019-04-19 DIAGNOSIS — S022XXD Fracture of nasal bones, subsequent encounter for fracture with routine healing: Secondary | ICD-10-CM | POA: Diagnosis not present

## 2019-04-19 DIAGNOSIS — F0281 Dementia in other diseases classified elsewhere with behavioral disturbance: Secondary | ICD-10-CM | POA: Diagnosis not present

## 2019-04-19 DIAGNOSIS — M1009 Idiopathic gout, multiple sites: Secondary | ICD-10-CM | POA: Diagnosis not present

## 2019-04-19 DIAGNOSIS — I10 Essential (primary) hypertension: Secondary | ICD-10-CM | POA: Diagnosis not present

## 2019-04-21 DIAGNOSIS — F3289 Other specified depressive episodes: Secondary | ICD-10-CM | POA: Diagnosis not present

## 2019-04-21 DIAGNOSIS — M1009 Idiopathic gout, multiple sites: Secondary | ICD-10-CM | POA: Diagnosis not present

## 2019-04-21 DIAGNOSIS — M810 Age-related osteoporosis without current pathological fracture: Secondary | ICD-10-CM | POA: Diagnosis not present

## 2019-04-21 DIAGNOSIS — Z7901 Long term (current) use of anticoagulants: Secondary | ICD-10-CM | POA: Diagnosis not present

## 2019-04-21 DIAGNOSIS — I480 Paroxysmal atrial fibrillation: Secondary | ICD-10-CM | POA: Diagnosis not present

## 2019-04-21 DIAGNOSIS — F0281 Dementia in other diseases classified elsewhere with behavioral disturbance: Secondary | ICD-10-CM | POA: Diagnosis not present

## 2019-04-21 DIAGNOSIS — E785 Hyperlipidemia, unspecified: Secondary | ICD-10-CM | POA: Diagnosis not present

## 2019-04-21 DIAGNOSIS — Z9181 History of falling: Secondary | ICD-10-CM | POA: Diagnosis not present

## 2019-04-21 DIAGNOSIS — G309 Alzheimer's disease, unspecified: Secondary | ICD-10-CM | POA: Diagnosis not present

## 2019-04-21 DIAGNOSIS — I10 Essential (primary) hypertension: Secondary | ICD-10-CM | POA: Diagnosis not present

## 2019-04-21 DIAGNOSIS — Z87440 Personal history of urinary (tract) infections: Secondary | ICD-10-CM | POA: Diagnosis not present

## 2019-04-21 DIAGNOSIS — E538 Deficiency of other specified B group vitamins: Secondary | ICD-10-CM | POA: Diagnosis not present

## 2019-04-21 DIAGNOSIS — S022XXD Fracture of nasal bones, subsequent encounter for fracture with routine healing: Secondary | ICD-10-CM | POA: Diagnosis not present

## 2019-05-01 ENCOUNTER — Telehealth: Payer: Self-pay | Admitting: Family Medicine

## 2019-05-02 NOTE — Telephone Encounter (Signed)
Jennifer Klein from [employer] (memory care dept [phone]) is going to leave a message for someone to return my call to schedule this appt.

## 2019-05-02 NOTE — Telephone Encounter (Signed)
Due to Dr. Sanda Klein being out on LOA for unknown period of time, and no documentation of efficacy of ativan at last visit, we will need to do a telephone or video visit to check in and discuss the medication prior to providing refill.  Please let patient's husband know and schedule a brief follow up with Benjamine Mola NP-C or myself.

## 2019-05-04 DIAGNOSIS — I10 Essential (primary) hypertension: Secondary | ICD-10-CM | POA: Diagnosis not present

## 2019-05-04 DIAGNOSIS — G309 Alzheimer's disease, unspecified: Secondary | ICD-10-CM | POA: Diagnosis not present

## 2019-05-04 DIAGNOSIS — M1009 Idiopathic gout, multiple sites: Secondary | ICD-10-CM | POA: Diagnosis not present

## 2019-05-04 DIAGNOSIS — S022XXD Fracture of nasal bones, subsequent encounter for fracture with routine healing: Secondary | ICD-10-CM | POA: Diagnosis not present

## 2019-05-04 DIAGNOSIS — I480 Paroxysmal atrial fibrillation: Secondary | ICD-10-CM | POA: Diagnosis not present

## 2019-05-04 DIAGNOSIS — F0281 Dementia in other diseases classified elsewhere with behavioral disturbance: Secondary | ICD-10-CM | POA: Diagnosis not present

## 2019-05-11 ENCOUNTER — Telehealth: Payer: Self-pay

## 2019-05-11 NOTE — Telephone Encounter (Signed)
Spoke with [name] (patients husband) regarding the patient having a virtual visit with Dr. Fletcher Anon. The patient is in memory care and will not be able to do a virtual visit. Mr. Coulon also is not sure when she would ever get to do a visit with Dr. Fletcher Anon.

## 2019-05-18 ENCOUNTER — Ambulatory Visit: Payer: Self-pay | Admitting: Family Medicine

## 2019-05-18 NOTE — Chronic Care Management (AMB) (Signed)
Chronic Care Management   Note  05/18/2019 Name: Jennifer Klein MRN: [id number] DOB: Aug 30, 1941  Jennifer Klein is a 78 y.o. year old female who is a primary care patient of Lada, Satira Anis, MD. I reached out to Erasmo Downer by phone today in response to a referral sent by Jennifer Klein's health plan.    Jennifer Klein was given information about Chronic Care Management services today including:  1. CCM service includes personalized support from designated clinical staff supervised by her physician, including individualized plan of care and coordination with other care providers 2. 24/7 contact phone numbers for assistance for urgent and routine care needs. 3. Service will only be billed when office clinical staff spend 20 minutes or more in a month to coordinate care. 4. Only one practitioner may furnish and bill the service in a calendar month. 5. The patient may stop CCM services at any time (effective at the end of the month) by phone call to the office staff. 6. The patient will be responsible for cost sharing (co-pay) of up to 20% of the service fee (after annual deductible is met).  Husband did not agree to services and does not wish to consider at this time. Because patient is in a [employer].   Follow up plan: The care management team is available to follow up with the patient after provider conversation with the patient regarding recommendation for care management engagement and subsequent re-referral to the care management team.   Hartman  [email or web address]   ??[phone]

## 2019-05-24 ENCOUNTER — Other Ambulatory Visit: Payer: Self-pay | Admitting: Family Medicine

## 2019-05-24 MED ORDER — LORAZEPAM 0.5 MG PO TABS: 0.2500 mg | ORAL_TABLET | Freq: Two times a day (BID) | ORAL | 2 refills | Status: DC

## 2019-05-24 NOTE — Telephone Encounter (Signed)
Copied from [place] [id number]. Topic: Quick Communication - Rx Refill/Question >> May 24, 2019 12:10 PM Pauline Good wrote: Medication: Lorazepam 0.5mg   Has the patient contacted their pharmacy? No (Agent: If no, request that the patient contact the pharmacy for the refill.) (Agent: If yes, when and what did the pharmacy advise?)  Preferred Pharmacy (with phone number or street name): [facility]  Agent: Please be advised that RX refills may take up to 3 business days. We ask that you follow-up with your pharmacy.

## 2019-05-25 ENCOUNTER — Telehealth: Payer: Self-pay | Admitting: Family Medicine

## 2019-05-25 NOTE — Telephone Encounter (Signed)
Please see note from 02/24/19.  We have been unable to refill as this is a controlled substance, I have to refill as a part of an office visit.  This can be done as a video visit as I understand she is currently in a memory care unit. I am happy to speak with the lead RN at the facility to discuss further if you are able to give me his/her number.

## 2019-05-25 NOTE — Telephone Encounter (Signed)
Jennifer Klein states pt only has 4 pills left and is requesting an update on this request.  Please advise.

## 2019-05-26 NOTE — Telephone Encounter (Signed)
appt has been scheduled for 6.11.2020. if you need to speak to someone they can be reached at [phone] ext [phone].

## 2019-06-01 ENCOUNTER — Ambulatory Visit: Payer: Medicare Other | Admitting: Family Medicine

## 2019-06-23 ENCOUNTER — Telehealth: Payer: Self-pay | Admitting: Family Medicine

## 2019-06-25 NOTE — Telephone Encounter (Signed)
Pt had appt 06/01/2019 which her caregiver canceled.  We need to do a visit with her and/or her caregiver to update vitals, patient status, etc.

## 2019-06-27 NOTE — Telephone Encounter (Signed)
Tried to call pt twice, call could not be completed at this time

## 2019-07-12 ENCOUNTER — Telehealth: Payer: Self-pay | Admitting: Nurse Practitioner

## 2019-07-12 NOTE — Telephone Encounter (Signed)
Tried to contact [facility] [phone]: the phone only rang with no answer. Tried 2x will try again tomorrow

## 2019-07-12 NOTE — Telephone Encounter (Signed)
Please schedule a visit next week.

## 2019-07-13 NOTE — Telephone Encounter (Signed)
appt scheduled for virtual visit next week

## 2019-07-17 ENCOUNTER — Ambulatory Visit: Payer: Medicare Other | Admitting: Nurse Practitioner

## 2019-07-19 ENCOUNTER — Encounter: Payer: Self-pay | Admitting: Nurse Practitioner

## 2019-07-19 ENCOUNTER — Other Ambulatory Visit: Payer: Self-pay

## 2019-07-19 ENCOUNTER — Ambulatory Visit (INDEPENDENT_AMBULATORY_CARE_PROVIDER_SITE_OTHER): Payer: Medicare Other | Admitting: Nurse Practitioner

## 2019-07-19 VITALS — BP 118/77 | HR 68 | Temp 97.2°F | Resp 16 | Wt 146.0 lb

## 2019-07-19 DIAGNOSIS — F0280 Dementia in other diseases classified elsewhere without behavioral disturbance: Secondary | ICD-10-CM | POA: Diagnosis not present

## 2019-07-19 DIAGNOSIS — M1A00X0 Idiopathic chronic gout, unspecified site, without tophus (tophi): Secondary | ICD-10-CM | POA: Diagnosis not present

## 2019-07-19 DIAGNOSIS — E785 Hyperlipidemia, unspecified: Secondary | ICD-10-CM | POA: Diagnosis not present

## 2019-07-19 DIAGNOSIS — I739 Peripheral vascular disease, unspecified: Secondary | ICD-10-CM | POA: Diagnosis not present

## 2019-07-19 DIAGNOSIS — E538 Deficiency of other specified B group vitamins: Secondary | ICD-10-CM | POA: Diagnosis not present

## 2019-07-19 DIAGNOSIS — M8580 Other specified disorders of bone density and structure, unspecified site: Secondary | ICD-10-CM | POA: Diagnosis not present

## 2019-07-19 DIAGNOSIS — I10 Essential (primary) hypertension: Secondary | ICD-10-CM

## 2019-07-19 DIAGNOSIS — I480 Paroxysmal atrial fibrillation: Secondary | ICD-10-CM

## 2019-07-19 DIAGNOSIS — L139 Bullous disorder, unspecified: Secondary | ICD-10-CM

## 2019-07-19 DIAGNOSIS — N183 Chronic kidney disease, stage 3 unspecified: Secondary | ICD-10-CM

## 2019-07-19 DIAGNOSIS — G309 Alzheimer's disease, unspecified: Secondary | ICD-10-CM | POA: Diagnosis not present

## 2019-07-19 MED ORDER — CLOBETASOL PROPIONATE 0.05 % EX CREA: 1.0000 "application " | TOPICAL_CREAM | Freq: Two times a day (BID) | CUTANEOUS | 0 refills | Status: DC

## 2019-07-19 NOTE — Progress Notes (Signed)
Virtual Visit via Video Note  I connected with Jennifer Klein on 07/19/19 at  9:40 AM EDT by a video enabled telemedicine application and verified that I am speaking with the correct person using two identifiers.   Staff discussed the limitations of evaluation and management by telemedicine and the availability of in person appointments. The patient expressed understanding and agreed to proceed.  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 There is not an in house lab option.   Patient has quarter-sized blister noted on left  upperforearm, first noticed yesterday, not itching, no known injury or burns.   Hypertension Patient is on metoprolol 25mg  daily.  Denies chest pain, headaches, blurry vision.  Hyperlipidemia Patient is taking atorvastatin 20mg  daily.  Diet: has regular diet.  Denies myalgias Patient has peripheral artery disease Lab Results  Component Value Date   CHOL 133 08/11/2016   HDL 40 08/11/2016   LDLCALC 63 08/11/2016   LDLDIRECT 137.4 06/03/2012   TRIG 151 (H) 08/11/2016   CHOLHDL 3.3 08/11/2016    Paroxysmal atrial fibrillation  Patient is taking BB and xarelto No excessive bleeding, bruising or dark tarry stools.   Alzheimer's dementia without behavioral disturbance Patient is taking namenda 10mg  BID & aricept 10mg  daily. Has good appetite. She is alert but not oriented, unable to make verbal commands. She is not aggressive. She sleeps well. No concerning behaviors. Her anxiety is well controlled on buspar 5mg  BID and lorazepam 0.5mg  BID.   Osteopenia Takes vitamin D and calcium supplementation Weight bearing exercises: walks all day.  Assistive devices: none, walks on her own  CKD (chronic kidney disease) stage 3,  Drinks plenty of water. No NSAIDs  B12 deficiency Patient is taking B12 500 mcg daily   Chronic  gout without tophus,  Patient is taking allopurinol 100mg  daily  Staff does not recall gout flare.    PHQ2/9: Depression screen [facility] 2/9 02/24/2019 01/27/2018 09/10/2017 06/02/2017 05/11/2017  Decreased Interest 0 0 0 0 0  Down, Depressed, Hopeless 0 0 0 0 0  PHQ - 2 Score 0 0 0 0 0  Altered sleeping 0 - - - -  Tired, decreased energy 0 - - - -  Change in appetite 0 - - - -  Feeling bad or failure about yourself  0 - - - -  Trouble concentrating 0 - - - -  Moving slowly or fidgety/restless 0 - - - -  Suicidal thoughts 0 - - - -  PHQ-9 Score 0 - - - -  Difficult doing work/chores Not difficult at all - - - -    PHQ reviewed. Negative  Patient Active Problem List   Diagnosis Date Noted  . DNAR (do not attempt resuscitation) 02/24/2019  . PAD (peripheral artery disease) ([place]) 06/22/2017  . Chronic venous insufficiency 06/22/2017  . CKD (chronic kidney disease) stage 3, GFR 30-59 ml/min (HCC) 09/04/2016  . B12 deficiency 05/06/2016  . Hyperlipidemia LDL goal <100 05/05/2016  . Alzheimer's dementia without behavioral disturbance ([place]) 05/04/2016  . Barrett's esophagus 11/09/2015  . Paroxysmal atrial fibrillation ([place]) 08/07/2013  . Osteopenia 08/08/2012  . Hypertension 06/03/2012  . Gout 06/03/2012    Past Medical History:  Diagnosis Date  . Alzheimer's dementia with behavioral disturbance ([place]) 05/04/2016  . Alzheimer's dementia without behavioral disturbance ([place]) 05/04/2016  . B12 deficiency 05/06/2016  . Broken  nose   . Gout   . Hemorrhoids   . Hyperlipidemia LDL goal <100 05/05/2016  . Hypertension   . Memory loss   . PAF (paroxysmal atrial fibrillation) ([place])    a. on Xarelto; b. 48-hr Holter 2014: NSR with PAF/flutter which happened at least 2.26% of the time w/ associated tachycardia. Patient was asymptomatic; c. CHADS2VASc => 4 (HTN, age x 2, female)  . Paroxysmal atrial fibrillation ([place]) 08/07/2013  . Senile osteoporosis   . Systolic dysfunction    a. echo 08/2013: EF  50-55%, mild MR, mildly dilated LA    Past Surgical History:  Procedure Laterality Date  . TUBAL LIGATION      Social History   Tobacco Use  . Smoking status: Former Smoker    Packs/day: 0.25    Years: 1.00    Pack years: 0.25    Types: Cigarettes  . Smokeless tobacco: Never Used  Substance Use Topics  . Alcohol use: No    Alcohol/week: 0.0 standard drinks     Current Outpatient Medications:  .  acetaminophen (TYLENOL) 500 MG tablet, Take 500 mg by mouth every 6 (six) hours as needed., Disp: , Rfl:  .  allopurinol (ZYLOPRIM) 100 MG tablet, Take 1 tablet (100 mg total) by mouth daily., Disp: 30 tablet, Rfl: 5 .  alum & mag hydroxide-simeth (MI-ACID) 854-627-03 MG/5ML suspension, Take 30 mLs by mouth every 6 (six) hours as needed for indigestion or heartburn., Disp: , Rfl:  .  atorvastatin (LIPITOR) 20 MG tablet, TAKE 1 TABLET BY MOUTH AT BEDTIME (Patient taking differently: Take 20 mg by mouth daily. ), Disp: 30 tablet, Rfl: 5 .  busPIRone (BUSPAR) 5 MG tablet, TAKE 1 TABLET BY MOUTH 2 TIMES DAILY FORDEPRESSION (Patient taking differently: Take 5 mg by mouth 2 (two) times daily. ), Disp: 60 tablet, Rfl: 11 .  cholecalciferol (VITAMIN D) 1000 units tablet, TAKE 1 TABLET BY MOUTH ONCE DAILY (Patient taking differently: Take 1,000 Units by mouth daily. ), Disp: 30 tablet, Rfl: 5 .  donepezil (ARICEPT) 10 MG tablet, TAKE 1 TABLET BY MOUTH BEDTIME (Patient taking differently: Take 10 mg by mouth at bedtime. TAKE 1 TABLET BY MOUTH BEDTIME), Disp: 30 tablet, Rfl: 11 .  guaifenesin (ROBITUSSIN) 100 MG/5ML syrup, Take 200 mg by mouth every 6 (six) hours as needed for cough. , Disp: , Rfl:  .  loperamide (IMODIUM) 2 MG capsule, Take 2 mg by mouth as needed for diarrhea or loose stools., Disp: , Rfl:  .  LORazepam (ATIVAN) 0.5 MG tablet, Take 0.5 tablets (0.25 mg total) by mouth 2 (two) times daily., Disp: 60 tablet, Rfl: 2 .  magnesium hydroxide (MILK OF MAGNESIA) 400 MG/5ML suspension, Take  30 mLs by mouth daily as needed for mild constipation., Disp: , Rfl:  .  memantine (NAMENDA) 10 MG tablet, Take 1 tablet (10 mg total) by mouth 2 (two) times daily., Disp: 60 tablet, Rfl: 5 .  metoprolol tartrate (LOPRESSOR) 25 MG tablet, TAKE 1 TABLET BY MOUTH 2 TIMES PER DAY, Disp: 30 tablet, Rfl: 0 .  neomycin-bacitracin-polymyxin (NEOSPORIN) [phone] ointment, Apply topically 4 (four) times daily as needed. , Disp: , Rfl:  .  Rivaroxaban (XARELTO) 15 MG TABS tablet, Take 1 tablet (15 mg total) by mouth daily with supper. Start taking on January 28, 2019   TAKE 1 TABLET BY MOUTH DAILY WITH SUPPER, Disp: 30 tablet, Rfl: 0 .  vitamin B-12 (CYANOCOBALAMIN) 500 MCG tablet, Take 500 mcg by mouth daily., Disp: , Rfl:  .  clobetasol cream (TEMOVATE) 3.43 %, Apply 1 application topically 2 (two) times daily., Disp: 30 g, Rfl: 0  Allergies  Allergen Reactions  . Darvon [Propoxyphene Hcl]     As a child    ROS   No other specific complaints in a complete review of systems (except as listed in HPI above).  Objective  Vitals:   07/19/19 0927  BP: 118/77  Pulse: 68  Resp: 16  Temp: (!) 97.2 F (36.2 C)  Weight: 146 lb (66.2 kg)     Body mass index is 25.06 kg/m.  Nursing Note and Vital Signs reviewed.  Physical Exam  Constitutional: Patient appears well-developed and well-nourished. No distress.  HENT: Head: Normocephalic and atraumatic. Cardiovascular: Normal rate Pulmonary/Chest: Effort normal  Musculoskeletal: Normal range of motion,  Neurological: alert, wandering behaviors Skin: No rash noted. No erythema. Bullous noted to upper forearm- no others noted.  Psychiatric: Patient has a normal mood and affect. behavior is normal. Judgment and thought content normal.    Assessment & Plan  1. Essential hypertension Stable, continue course of therapy   2. Paroxysmal atrial fibrillation (HCC) Stable, continue course of therapy   3. PAD (peripheral artery disease)  (HCC) Stable, continue course of therapy   4. Alzheimer's dementia without behavioral disturbance, unspecified timing of dementia onset ([place]) Progressively worsening, appetite and hydration good   5. Osteopenia, unspecified location Stable, continue course of therapy   6. CKD (chronic kidney disease) stage 3, GFR 30-59 ml/min (HCC) hydration  7. Hyperlipidemia LDL goal <100 Unable to monitor labs during pandemic, will order at routine follow-up   8. B12 deficiency Stable, continue course of therapy   9. Idiopathic chronic gout without tophus, unspecified site Stable, continue course of therapy   10. Bullous dermatitis Follow-up as needed.  - clobetasol cream (TEMOVATE) 0.05 %; Apply 1 application topically 2 (two) times daily.  Dispense: 30 g; Refill: 0    Follow Up Instructions:    I discussed the assessment and treatment plan with the patient. The patient was provided an opportunity to ask questions and all were answered. The patient agreed with the plan and demonstrated an understanding of the instructions.   The patient was advised to call back or seek an in-person evaluation if the symptoms worsen or if the condition fails to improve as anticipated.  I provided 27 minutes of non-face-to-face time during this encounter.   Fredderick Severance, NP

## 2019-07-24 ENCOUNTER — Other Ambulatory Visit: Payer: Self-pay | Admitting: Nurse Practitioner

## 2019-08-07 ENCOUNTER — Other Ambulatory Visit: Payer: Self-pay | Admitting: Nurse Practitioner

## 2019-08-10 ENCOUNTER — Other Ambulatory Visit: Payer: Self-pay | Admitting: Nurse Practitioner

## 2019-08-23 DIAGNOSIS — D519 Vitamin B12 deficiency anemia, unspecified: Secondary | ICD-10-CM | POA: Diagnosis not present

## 2019-08-23 DIAGNOSIS — I10 Essential (primary) hypertension: Secondary | ICD-10-CM | POA: Diagnosis not present

## 2019-08-23 DIAGNOSIS — M1A9XX0 Chronic gout, unspecified, without tophus (tophi): Secondary | ICD-10-CM | POA: Diagnosis not present

## 2019-08-23 DIAGNOSIS — I480 Paroxysmal atrial fibrillation: Secondary | ICD-10-CM | POA: Diagnosis not present

## 2019-08-23 DIAGNOSIS — K22719 Barrett's esophagus with dysplasia, unspecified: Secondary | ICD-10-CM | POA: Diagnosis not present

## 2019-08-23 DIAGNOSIS — G309 Alzheimer's disease, unspecified: Secondary | ICD-10-CM | POA: Diagnosis not present

## 2019-08-23 DIAGNOSIS — E782 Mixed hyperlipidemia: Secondary | ICD-10-CM | POA: Diagnosis not present

## 2019-08-24 DIAGNOSIS — Z79899 Other long term (current) drug therapy: Secondary | ICD-10-CM | POA: Diagnosis not present

## 2019-08-29 ENCOUNTER — Ambulatory Visit: Payer: Medicare Other | Admitting: Family Medicine

## 2019-08-30 DIAGNOSIS — D519 Vitamin B12 deficiency anemia, unspecified: Secondary | ICD-10-CM | POA: Diagnosis not present

## 2019-08-30 DIAGNOSIS — E782 Mixed hyperlipidemia: Secondary | ICD-10-CM | POA: Diagnosis not present

## 2019-08-30 DIAGNOSIS — I10 Essential (primary) hypertension: Secondary | ICD-10-CM | POA: Diagnosis not present

## 2019-08-30 DIAGNOSIS — G309 Alzheimer's disease, unspecified: Secondary | ICD-10-CM | POA: Diagnosis not present

## 2019-08-30 DIAGNOSIS — M1A9XX0 Chronic gout, unspecified, without tophus (tophi): Secondary | ICD-10-CM | POA: Diagnosis not present

## 2019-09-06 DIAGNOSIS — G309 Alzheimer's disease, unspecified: Secondary | ICD-10-CM | POA: Diagnosis not present

## 2019-09-06 DIAGNOSIS — K22719 Barrett's esophagus with dysplasia, unspecified: Secondary | ICD-10-CM | POA: Diagnosis not present

## 2019-09-06 DIAGNOSIS — I10 Essential (primary) hypertension: Secondary | ICD-10-CM | POA: Diagnosis not present

## 2019-09-06 DIAGNOSIS — F064 Anxiety disorder due to known physiological condition: Secondary | ICD-10-CM | POA: Diagnosis not present

## 2019-10-16 DIAGNOSIS — I480 Paroxysmal atrial fibrillation: Secondary | ICD-10-CM | POA: Diagnosis not present

## 2019-10-16 DIAGNOSIS — R040 Epistaxis: Secondary | ICD-10-CM | POA: Diagnosis not present

## 2019-10-16 DIAGNOSIS — I10 Essential (primary) hypertension: Secondary | ICD-10-CM | POA: Diagnosis not present

## 2019-10-16 DIAGNOSIS — G309 Alzheimer's disease, unspecified: Secondary | ICD-10-CM | POA: Diagnosis not present

## 2019-12-04 ENCOUNTER — Emergency Department
Admission: EM | Admit: 2019-12-04 | Discharge: 2019-12-04 | Disposition: A | Discharge status: Skilled Nursing Facility | Payer: Medicare Other | Attending: Emergency Medicine | Admitting: Emergency Medicine

## 2019-12-04 ENCOUNTER — Encounter: Payer: Self-pay | Admitting: Emergency Medicine

## 2019-12-04 ENCOUNTER — Other Ambulatory Visit: Payer: Self-pay

## 2019-12-04 DIAGNOSIS — F0280 Dementia in other diseases classified elsewhere without behavioral disturbance: Secondary | ICD-10-CM | POA: Insufficient documentation

## 2019-12-04 DIAGNOSIS — W19XXXA Unspecified fall, initial encounter: Secondary | ICD-10-CM | POA: Insufficient documentation

## 2019-12-04 DIAGNOSIS — Z043 Encounter for examination and observation following other accident: Secondary | ICD-10-CM | POA: Diagnosis present

## 2019-12-04 DIAGNOSIS — G309 Alzheimer's disease, unspecified: Secondary | ICD-10-CM | POA: Diagnosis not present

## 2019-12-04 DIAGNOSIS — Z79899 Other long term (current) drug therapy: Secondary | ICD-10-CM | POA: Diagnosis not present

## 2019-12-04 DIAGNOSIS — I129 Hypertensive chronic kidney disease with stage 1 through stage 4 chronic kidney disease, or unspecified chronic kidney disease: Secondary | ICD-10-CM | POA: Insufficient documentation

## 2019-12-04 DIAGNOSIS — Z87891 Personal history of nicotine dependence: Secondary | ICD-10-CM | POA: Diagnosis not present

## 2019-12-04 DIAGNOSIS — N182 Chronic kidney disease, stage 2 (mild): Secondary | ICD-10-CM | POA: Diagnosis not present

## 2019-12-04 NOTE — Discharge Instructions (Addendum)
No evidence of injury on exam, cleared for discharge outpatient follow-up with PCP as needed

## 2019-12-04 NOTE — ED Provider Notes (Signed)
[facility] Emergency Department Provider Note   ____________________________________________    I have reviewed the triage vital signs and the nursing notes.   HISTORY  Chief Complaint Fall     HPI Jennifer Klein is a 78 y.o. female present after a fall.  Patient has a history of Alzheimer's dementia, she is unable to give [place] any history.  Reportedly patient had an unwitnessed fall.  She has no complaints at this time  Past Medical History:  Diagnosis Date  . Alzheimer's dementia with behavioral disturbance ([place]) 05/04/2016  . Alzheimer's dementia without behavioral disturbance ([place]) 05/04/2016  . B12 deficiency 05/06/2016  . Broken nose   . Gout   . Hemorrhoids   . Hyperlipidemia LDL goal <100 05/05/2016  . Hypertension   . Memory loss   . PAF (paroxysmal atrial fibrillation) ([place])    a. on Xarelto; b. 48-hr Holter 2014: NSR with PAF/flutter which happened at least 2.26% of the time w/ associated tachycardia. Patient was asymptomatic; c. CHADS2VASc => 4 (HTN, age x 2, female)  . Paroxysmal atrial fibrillation ([place]) 08/07/2013  . Senile osteoporosis   . Systolic dysfunction    a. echo 08/2013: EF 50-55%, mild MR, mildly dilated LA    Patient Active Problem List   Diagnosis Date Noted  . DNAR (do not attempt resuscitation) 02/24/2019  . PAD (peripheral artery disease) ([place]) 06/22/2017  . Chronic venous insufficiency 06/22/2017  . CKD (chronic kidney disease) stage 3, GFR 30-59 ml/min 09/04/2016  . B12 deficiency 05/06/2016  . Hyperlipidemia LDL goal <100 05/05/2016  . Alzheimer's dementia without behavioral disturbance ([place]) 05/04/2016  . Barrett's esophagus 11/09/2015  . Paroxysmal atrial fibrillation ([place]) 08/07/2013  . Osteopenia 08/08/2012  . Hypertension 06/03/2012  . Gout 06/03/2012    Past Surgical History:  Procedure Laterality Date  . TUBAL LIGATION      Prior to Admission medications   Medication Sig Start Date End Date  Taking? Authorizing Provider  acetaminophen (TYLENOL) 500 MG tablet Take 500 mg by mouth every 6 (six) hours as needed.    [provider]  allopurinol (ZYLOPRIM) 100 MG tablet Take 1 tablet (100 mg total) by mouth daily. 10/27/18   Arnetha Courser, MD  alum & mag hydroxide-simeth (MI-ACID) 200-200-20 MG/5ML suspension Take 30 mLs by mouth every 6 (six) hours as needed for indigestion or heartburn.    [provider]  atorvastatin (LIPITOR) 20 MG tablet TAKE 1 TABLET BY MOUTH AT BEDTIME Patient taking differently: Take 20 mg by mouth daily.  06/07/18   Lada, Satira Anis, MD  busPIRone (BUSPAR) 5 MG tablet TAKE 1 TABLET BY MOUTH 2 TIMES DAILY FORDEPRESSION Patient taking differently: Take 5 mg by mouth 2 (two) times daily.  05/30/18   Lada, Satira Anis, MD  cholecalciferol (VITAMIN D) 1000 units tablet TAKE 1 TABLET BY MOUTH ONCE DAILY Patient taking differently: Take 1,000 Units by mouth daily.  04/13/18   Arnetha Courser, MD  clobetasol cream (TEMOVATE) [id number] % Apply 1 application topically 2 (two) times daily. 07/19/19   Poulose, Bethel Born, NP  donepezil (ARICEPT) 10 MG tablet TAKE 1 TABLET BY MOUTH BEDTIME Patient taking differently: Take 10 mg by mouth at bedtime. TAKE 1 TABLET BY MOUTH BEDTIME 10/27/18   Lada, Satira Anis, MD  GNP VITAMIN B-12 500 MCG tablet TAKE 1 TABLET BY MOUTH EVERY DAY 07/24/19   Poulose, Bethel Born, NP  guaifenesin (ROBITUSSIN) 100 MG/5ML syrup Take 200 mg by mouth every 6 (  six) hours as needed for cough.     [provider]  loperamide (IMODIUM) 2 MG capsule Take 2 mg by mouth as needed for diarrhea or loose stools.    [provider]  LORazepam (ATIVAN) 0.5 MG tablet Take 0.5 tablets (0.25 mg total) by mouth 2 (two) times daily. 05/24/19   Poulose, Bethel Born, NP  magnesium hydroxide (MILK OF MAGNESIA) 400 MG/5ML suspension Take 30 mLs by mouth daily as needed for mild constipation.    [provider]  memantine (NAMENDA) 10 MG tablet  Take 1 tablet (10 mg total) by mouth 2 (two) times daily. 10/27/18   Arnetha Courser, MD  metoprolol tartrate (LOPRESSOR) 25 MG tablet TAKE 1 TABLET BY MOUTH 2 TIMES PER DAY 06/25/19   Hubbard Hartshorn, FNP  neomycin-bacitracin-polymyxin (NEOSPORIN) [phone] ointment Apply topically 4 (four) times daily as needed.     [provider]  XARELTO 15 MG TABS tablet TAKE 1 TABLET BY MOUTH ONCE DAILY 08/10/19   Poulose, Bethel Born, NP     Allergies Darvon [propoxyphene hcl]  Family History  Problem Relation Age of Onset  . Heart disease Father   . Gout Brother     Social History Social History   Tobacco Use  . Smoking status: Former Smoker    Packs/day: 0.25    Years: 1.00    Pack years: 0.25    Types: Cigarettes  . Smokeless tobacco: Never Used  Substance Use Topics  . Alcohol use: No    Alcohol/week: 0.0 standard drinks  . Drug use: No    Unable to obtain review of Systems due to altered mental status    ____________________________________________   PHYSICAL EXAM:  VITAL SIGNS: ED Triage Vitals  Enc Vitals Group     BP 12/04/19 1352 125/77     Pulse Rate 12/04/19 1352 96     Resp 12/04/19 1352 18     Temp 12/04/19 1352 (!) 96.5 F (35.8 C)     Temp Source 12/04/19 1352 Rectal     SpO2 --      Weight 12/04/19 1353 66.2 kg (145 lb 15.1 oz)     Height --      Head Circumference --      Peak Flow --      Pain Score --      Pain Loc --      Pain Edu? --      Excl. in [place]? --     Constitutional: Alert   Head: Atraumatic.  No evidence of injury Nose: No swelling epistaxis Mouth/Throat: Mucous membranes are moist.   Neck:  Painless ROM, no pain with axial load, no vertebral tenderness palpation Cardiovascular: Normal rate, regular rhythm.Good peripheral circulation.  No chest wall tenderness to palpation Respiratory: Normal respiratory effort.  No retractions. Gastrointestinal: Soft and nontender. No distention.   Musculoskeletal: Full range of motion of  all extremities, no bony abnormalities.  No clavicular tenderness.  No vertebral tenderness palpation.  No pain with axial load on both hips.  No pelvic tenderness Neurologic:  Normal speech and language. No gross focal neurologic deficits are appreciated.  Skin:  Skin is warm, dry and intact.  Psychiatric: Mood and affect are normal. Speech and behavior are normal.  ____________________________________________   LABS (all labs ordered are listed, but only abnormal results are displayed)  Labs Reviewed - No data to display ____________________________________________  EKG   ____________________________________________  RADIOLOGY   ____________________________________________   PROCEDURES  Procedure(s) performed:  No  Procedures   Critical Care performed: No ____________________________________________   INITIAL IMPRESSION / ASSESSMENT AND PLAN / ED COURSE  Pertinent labs & imaging results that were available during my care of the patient were reviewed by me and considered in my medical decision making (see chart for details).  Patient overall well-appearing in no acute distress, she appears to be at her baseline.  No evidence of significant trauma, reassuring thorough exam.  Appropriate for discharge at this time    ____________________________________________   FINAL CLINICAL IMPRESSION(S) / ED DIAGNOSES  Final diagnoses:  Fall, initial encounter        Note:  This document was prepared using Dragon voice recognition software and may include unintentional dictation errors.   Lavonia Drafts, MD 12/04/19 [phone]

## 2019-12-04 NOTE — ED Notes (Signed)
Pt resting comfortably in 12H bed. When asked how pt is feeling, she sings. NAD noted.

## 2019-12-04 NOTE — ED Notes (Signed)
Pt incontinent of urine and stool; would not cooperate for in & out cath. Pt cleaned up and placed in hospital gown.

## 2019-12-04 NOTE — ED Notes (Signed)
Pt given warm blankets.

## 2019-12-04 NOTE — ED Triage Notes (Signed)
Pt via ems from [facility] after unwitnessed fall. Pt is in memory care and is unable to provide information. Pt has no obvious bruising or bleeding. Pt alert, babbles at times, unable to hold thermometer in her mouth or otherwise assist in her care. EMS reports that this is baseline for the patient.

## 2019-12-04 NOTE — ED Notes (Signed)
Pt discharged to [facility] via ambulance. Discharge instructions given to EMS. nad noted.

## 2020-01-31 IMAGING — CT CT CERVICAL SPINE W/O CM
4 of 12 series · 9 of 34 positions shown, 10 images · non-contrast
Comparison: CT head 11/07/2018

CLINICAL DATA: Unwitnessed fall.  Facial injury.

EXAM:
CT HEAD WITHOUT CONTRAST
CT MAXILLOFACIAL WITHOUT CONTRAST
CT CERVICAL SPINE WITHOUT CONTRAST
TECHNIQUE: Multidetector CT imaging of the head, cervical spine, and
maxillofacial structures were performed using the standard protocol
without intravenous contrast. Multiplanar CT image reconstructions
of the cervical spine and maxillofacial structures were also
generated.

[Series 7: c spine soft · axial · 0.26mm/px · z∈[+451,+499]mm · 2 of 72 slices shown]
[im 24/72  soft-tissue]
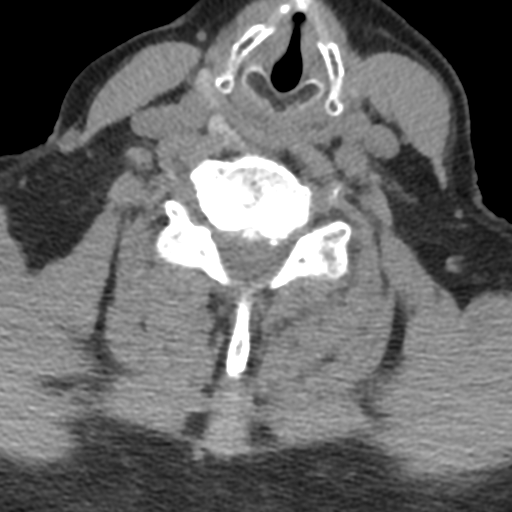
[im 48/72  soft-tissue]
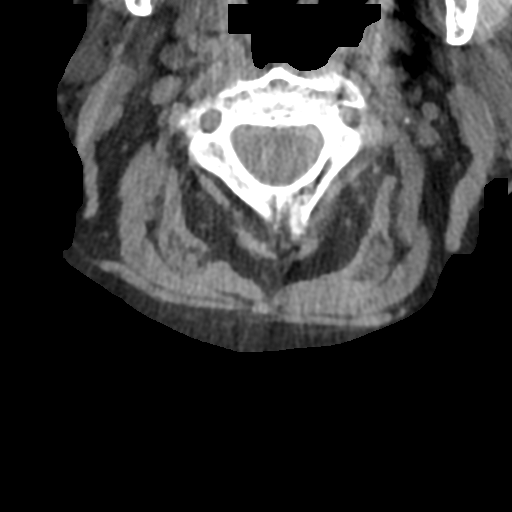

[Series 11: orthogonal axials · axial · 0.26mm/px · z∈[+408,+495]mm · 3 of 96 slices shown, 4 images]
[im 24/96  soft-tissue]
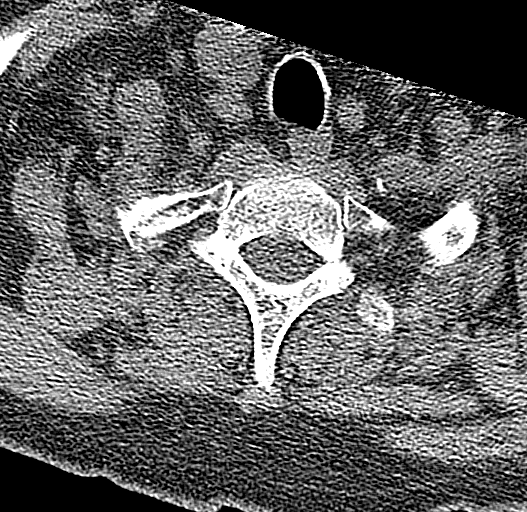
[im 24/96  bone]
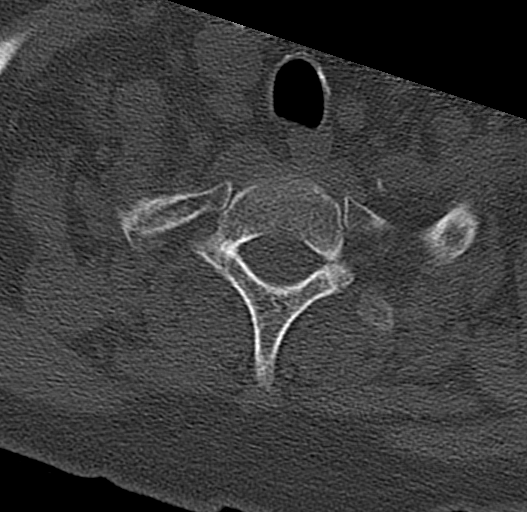
[im 48/96  bone]
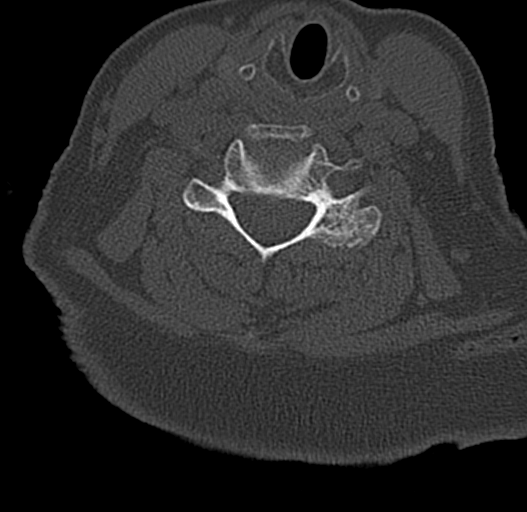
[im 72/96  bone]
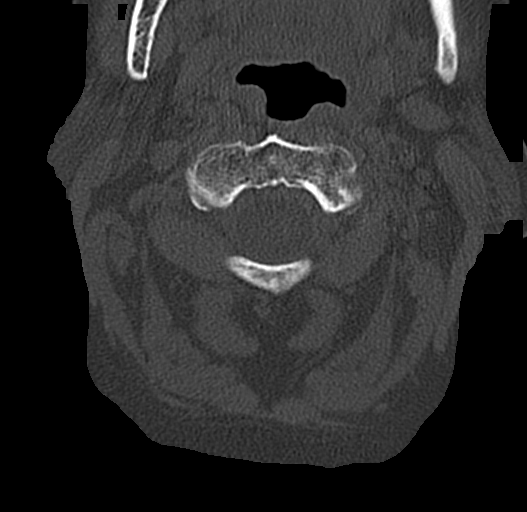

[Series 12: max soft · axial · 0.33mm/px · z∈[+499,+547]mm · 2 of 74 slices shown]
[im 25/74  soft-tissue]
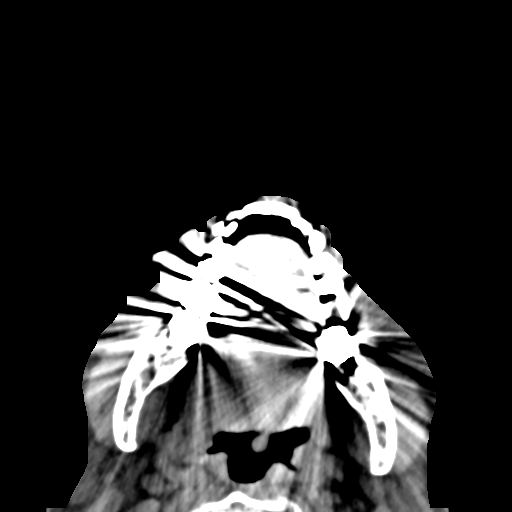
[im 49/74  soft-tissue]
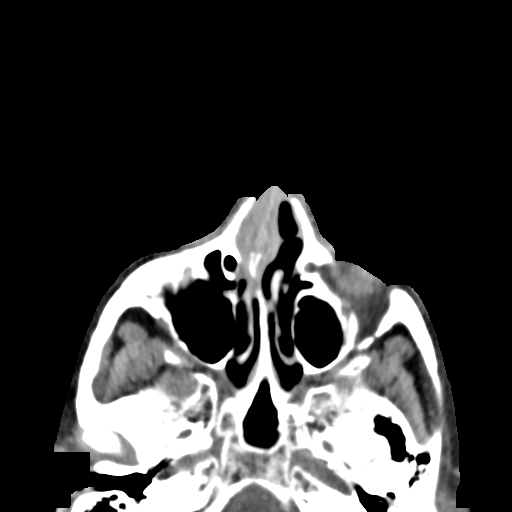

[Series 19: sagittal bone · sagittal · 0.30mm/px · 2 of 76 slices shown]
[im 26/76  bone]
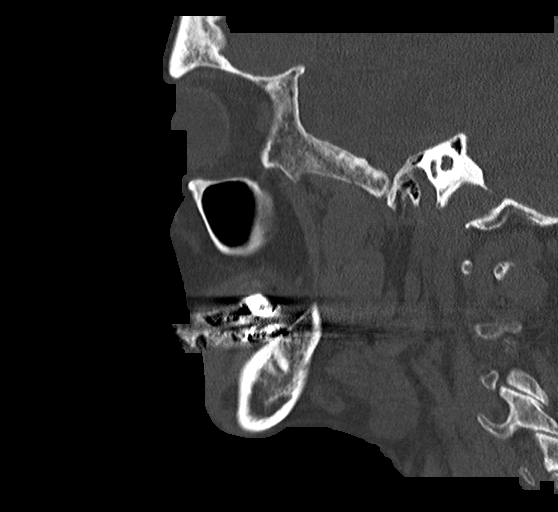
[im 51/76  bone]
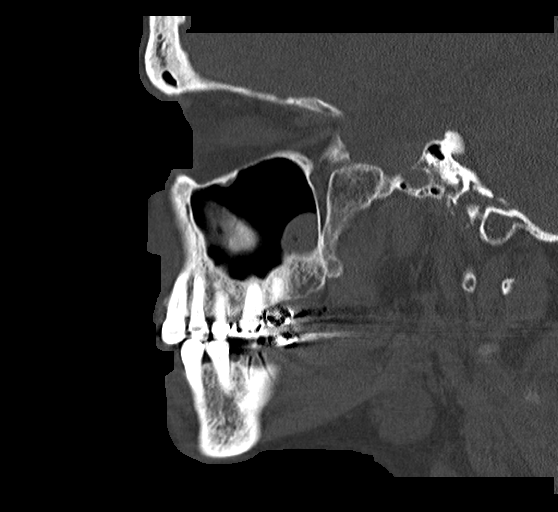

[9 of 34 positions shown; findings below may reference images not displayed]

FINDINGS: CT HEAD FINDINGS

Brain: Moderate to advanced atrophy. Chronic ischemic changes in the
white matter and right internal capsule. Negative for acute infarct.
Negative for acute hemorrhage or mass.

Vascular: Negative for hyperdense vessel

Skull: Nasal bone fracture.  Air-fluid level left maxillary sinus.

Other: None

CT MAXILLOFACIAL FINDINGS

Osseous: Comminuted mildly displaced fracture of the nasal bone.

Displaced fracture nasal septum.

No other facial fracture identified.

Orbits: Negative for fracture of the orbit. Normal soft tissues in
the orbit.

Sinuses: Mild mucosal edema paranasal sinuses.

Soft tissues: Negative

CT CERVICAL SPINE FINDINGS

Alignment: Mild anterolisthesis C4-5 due to facet degeneration.

Skull base and vertebrae: Negative for fracture

Soft tissues and spinal canal: Negative

Disc levels: Disc degeneration and spondylosis C5-6 and C6-7 causing
moderate to severe foraminal encroachment bilaterally. Marked left
facet degeneration at C4-5.

Upper chest: Negative

Other: None
IMPRESSION: 1. Advanced atrophy and chronic ischemic change. No acute
intracranial abnormality
2. Comminuted fracture nasal bone. Displaced fracture nasal septum.
No other facial fracture
3. Cervical spondylosis.  Negative for fracture

## 2020-02-13 ENCOUNTER — Other Ambulatory Visit: Payer: Self-pay

## 2020-02-13 MED ORDER — ATORVASTATIN CALCIUM 20 MG PO TABS: 20.0000 mg | ORAL_TABLET | Freq: Every day | ORAL | 0 refills | Status: DC

## 2020-02-13 NOTE — Telephone Encounter (Signed)
Last visit:07/19/19  last lab: 3 yr ago  (08/11/16) 3 yr ago  (05/04/16)    Cholesterol, Total 100 - 199 mg/dL 133  237High    Triglycerides 0 - 149 mg/dL 151High   197High    HDL >39 mg/dL 40  46   VLDL Cholesterol Cal 5 - 40 mg/dL 30  39   LDL Calculated 0 - 99 mg/dL 63  152High    Chol/HDL Ratio 0.0 - 4.4 ratio units 3.3

## 2020-03-04 ENCOUNTER — Telehealth: Payer: Self-pay

## 2020-03-04 NOTE — Telephone Encounter (Signed)
Refill request for general medication.  Last office visit:07/19/19   No follow-ups on file.  In nursing home

## 2020-03-05 NOTE — Telephone Encounter (Signed)
Pt needs visit, hasn't been seen in over a year

## 2020-03-06 NOTE — Telephone Encounter (Signed)
Called the home phone and they said that Jennifer Klein has been at the [facility] for over a year. He thinks that they take care of her medications.

## 2020-05-09 ENCOUNTER — Other Ambulatory Visit: Payer: Self-pay | Admitting: Family Medicine

## 2020-06-17 ENCOUNTER — Other Ambulatory Visit: Payer: Self-pay | Admitting: Family Medicine

## 2020-06-18 ENCOUNTER — Emergency Department: Payer: Medicare Other

## 2020-06-18 ENCOUNTER — Other Ambulatory Visit: Payer: Self-pay

## 2020-06-18 ENCOUNTER — Encounter: Payer: Self-pay | Admitting: Intensive Care

## 2020-06-18 ENCOUNTER — Emergency Department
Admission: EM | Admit: 2020-06-18 | Discharge: 2020-06-18 | Disposition: A | Discharge status: Home / Self Care | Payer: Medicare Other | Attending: Emergency Medicine | Admitting: Emergency Medicine

## 2020-06-18 DIAGNOSIS — Y999 Unspecified external cause status: Secondary | ICD-10-CM | POA: Insufficient documentation

## 2020-06-18 DIAGNOSIS — N183 Chronic kidney disease, stage 3 unspecified: Secondary | ICD-10-CM | POA: Insufficient documentation

## 2020-06-18 DIAGNOSIS — W06XXXA Fall from bed, initial encounter: Secondary | ICD-10-CM | POA: Insufficient documentation

## 2020-06-18 DIAGNOSIS — Z79899 Other long term (current) drug therapy: Secondary | ICD-10-CM | POA: Insufficient documentation

## 2020-06-18 DIAGNOSIS — Z7901 Long term (current) use of anticoagulants: Secondary | ICD-10-CM | POA: Diagnosis not present

## 2020-06-18 DIAGNOSIS — Y939 Activity, unspecified: Secondary | ICD-10-CM | POA: Diagnosis not present

## 2020-06-18 DIAGNOSIS — S0093XA Contusion of unspecified part of head, initial encounter: Secondary | ICD-10-CM | POA: Diagnosis not present

## 2020-06-18 DIAGNOSIS — W19XXXA Unspecified fall, initial encounter: Secondary | ICD-10-CM

## 2020-06-18 DIAGNOSIS — Y92092 Bedroom in other non-institutional residence as the place of occurrence of the external cause: Secondary | ICD-10-CM | POA: Diagnosis not present

## 2020-06-18 DIAGNOSIS — I129 Hypertensive chronic kidney disease with stage 1 through stage 4 chronic kidney disease, or unspecified chronic kidney disease: Secondary | ICD-10-CM | POA: Insufficient documentation

## 2020-06-18 DIAGNOSIS — S0083XA Contusion of other part of head, initial encounter: Secondary | ICD-10-CM

## 2020-06-18 DIAGNOSIS — S0990XA Unspecified injury of head, initial encounter: Secondary | ICD-10-CM | POA: Diagnosis present

## 2020-06-18 DIAGNOSIS — Z87891 Personal history of nicotine dependence: Secondary | ICD-10-CM | POA: Insufficient documentation

## 2020-06-18 LAB — CBC WITH DIFFERENTIAL/PLATELET
Abs Immature Granulocytes: 0.05 10*3/uL (ref 0.00–0.07)
Basophils Absolute: 0 10*3/uL (ref 0.0–0.1)
Basophils Relative: 0 %
Eosinophils Absolute: 0.1 10*3/uL (ref 0.0–0.5)
Eosinophils Relative: 1 %
HCT: 37.5 % (ref 36.0–46.0)
Hemoglobin: 12 g/dL (ref 12.0–15.0)
Immature Granulocytes: 1 %
Lymphocytes Relative: 13 %
Lymphs Abs: 1.4 10*3/uL (ref 0.7–4.0)
MCH: 29.6 pg (ref 26.0–34.0)
MCHC: 32 g/dL (ref 30.0–36.0)
MCV: 92.4 fL (ref 80.0–100.0)
Monocytes Absolute: 0.8 10*3/uL (ref 0.1–1.0)
Monocytes Relative: 7 %
Neutro Abs: 8.6 10*3/uL — ABNORMAL HIGH (ref 1.7–7.7)
Neutrophils Relative %: 78 %
Platelets: 382 10*3/uL (ref 150–400)
RBC: 4.06 MIL/uL (ref 3.87–5.11)
RDW: 13.2 % (ref 11.5–15.5)
WBC: 10.9 10*3/uL — ABNORMAL HIGH (ref 4.0–10.5)
nRBC: 0 % (ref 0.0–0.2)

## 2020-06-18 LAB — BASIC METABOLIC PANEL
Anion gap: 8 (ref 5–15)
BUN: 25 mg/dL — ABNORMAL HIGH (ref 8–23)
CO2: 26 mmol/L (ref 22–32)
Calcium: 8.4 mg/dL — ABNORMAL LOW (ref 8.9–10.3)
Chloride: 106 mmol/L (ref 98–111)
Creatinine, Ser: 0.98 mg/dL (ref 0.44–1.00)
GFR calc Af Amer: >60 mL/min (ref >60)
GFR calc non Af Amer: 54 mL/min — ABNORMAL LOW (ref >60)
Glucose, Bld: 117 mg/dL — ABNORMAL HIGH (ref 70–99)
Potassium: 3.7 mmol/L (ref 3.5–5.1)
Sodium: 140 mmol/L (ref 135–145)

## 2020-06-18 NOTE — ED Notes (Signed)
This RN attempted to call pt husband, [name] and [employer]. This RN unable to contact either via phone at this time

## 2020-06-18 NOTE — ED Triage Notes (Signed)
Pt in via EMS from [facility] with c/o fall and hematoma to left forehead and pt takes xarelto.

## 2020-06-18 NOTE — Discharge Instructions (Addendum)
PLEASE HOLD MS. Jennifer Klein'S XARELTO DOSE TONIGHT AND TOMORROW MORNING  CALL THE FACILITY DOCTOR TO DISCUSS WHEN TO RESTART, TO PREVENT EXPANSION OF HER HEMATOMA  MONITOR FOR ANY CHANGE IN MENTAL STATUS

## 2020-06-18 NOTE — ED Triage Notes (Signed)
Patient brought in by EMS from [facility] for fall. Hematoma present to left forehead. Patient takes blood thinners. Patient has alzheimers dementia and does not respond to questions. Disoriented X4

## 2020-06-18 NOTE — ED Provider Notes (Signed)
[facility] Emergency Department Provider Note  ____________________________________________   First MD Initiated Contact with Patient 06/18/20 1806     (approximate)  I have reviewed the triage vital signs and the nursing notes.   HISTORY  Chief Complaint Fall    HPI Jennifer Klein is a 79 y.o. female  With h/o dementia, AFib on Xarelto here with fall. History limited 2/2 dementia. Per report, pt tried to get out of bed today and fell, landing on her forehead. No LOC. She has been at her mental baseline. Unable to provide additional history. Family and facility has not answered.  Level 5 caveat invoked as remainder of history, ROS, and physical exam limited due to patient's dementia.         Past Medical History:  Diagnosis Date  . Alzheimer's dementia with behavioral disturbance ([place]) 05/04/2016  . Alzheimer's dementia without behavioral disturbance ([place]) 05/04/2016  . B12 deficiency 05/06/2016  . Broken nose   . Gout   . Hemorrhoids   . Hyperlipidemia LDL goal <100 05/05/2016  . Hypertension   . Memory loss   . PAF (paroxysmal atrial fibrillation) ([place])    a. on Xarelto; b. 48-hr Holter 2014: NSR with PAF/flutter which happened at least 2.26% of the time w/ associated tachycardia. Patient was asymptomatic; c. CHADS2VASc => 4 (HTN, age x 2, female)  . Paroxysmal atrial fibrillation ([place]) 08/07/2013  . Senile osteoporosis   . Systolic dysfunction    a. echo 08/2013: EF 50-55%, mild MR, mildly dilated LA    Patient Active Problem List   Diagnosis Date Noted  . DNAR (do not attempt resuscitation) 02/24/2019  . PAD (peripheral artery disease) ([place]) 06/22/2017  . Chronic venous insufficiency 06/22/2017  . CKD (chronic kidney disease) stage 3, GFR 30-59 ml/min 09/04/2016  . B12 deficiency 05/06/2016  . Hyperlipidemia LDL goal <100 05/05/2016  . Alzheimer's dementia without behavioral disturbance ([place]) 05/04/2016  . Barrett's esophagus 11/09/2015    . Paroxysmal atrial fibrillation ([place]) 08/07/2013  . Osteopenia 08/08/2012  . Hypertension 06/03/2012  . Gout 06/03/2012    Past Surgical History:  Procedure Laterality Date  . TUBAL LIGATION      Prior to Admission medications   Medication Sig Start Date End Date Taking? Authorizing Provider  acetaminophen (TYLENOL) 500 MG tablet Take 500 mg by mouth every 6 (six) hours as needed.    [provider]  allopurinol (ZYLOPRIM) 100 MG tablet Take 1 tablet (100 mg total) by mouth daily. 10/27/18   Arnetha Courser, MD  alum & mag hydroxide-simeth (MI-ACID) 200-200-20 MG/5ML suspension Take 30 mLs by mouth every 6 (six) hours as needed for indigestion or heartburn.    [provider]  atorvastatin (LIPITOR) 20 MG tablet Take 1 tablet (20 mg total) by mouth daily. 02/13/20   Delsa Grana, PA-C  busPIRone (BUSPAR) 5 MG tablet TAKE 1 TABLET BY MOUTH 2 TIMES DAILY FORDEPRESSION Patient taking differently: Take 5 mg by mouth 2 (two) times daily.  05/30/18   Lada, Satira Anis, MD  cholecalciferol (VITAMIN D) 1000 units tablet TAKE 1 TABLET BY MOUTH ONCE DAILY Patient taking differently: Take 1,000 Units by mouth daily.  04/13/18   Arnetha Courser, MD  clobetasol cream (TEMOVATE) 6.76 % Apply 1 application topically 2 (two) times daily. 07/19/19   Poulose, Bethel Born, NP  donepezil (ARICEPT) 10 MG tablet TAKE 1 TABLET BY MOUTH BEDTIME Patient taking differently: Take 10 mg by mouth at bedtime. TAKE 1 TABLET BY MOUTH  BEDTIME 10/27/18   Lada, Satira Anis, MD  GNP VITAMIN B-12 500 MCG tablet TAKE 1 TABLET BY MOUTH EVERY DAY 07/24/19   Poulose, Bethel Born, NP  guaifenesin (ROBITUSSIN) 100 MG/5ML syrup Take 200 mg by mouth every 6 (six) hours as needed for cough.     [provider]  loperamide (IMODIUM) 2 MG capsule Take 2 mg by mouth as needed for diarrhea or loose stools.    [provider]  LORazepam (ATIVAN) 0.5 MG tablet Take 0.5 tablets (0.25 mg total) by mouth 2 (two)  times daily. 05/24/19   Poulose, Bethel Born, NP  magnesium hydroxide (MILK OF MAGNESIA) 400 MG/5ML suspension Take 30 mLs by mouth daily as needed for mild constipation.    [provider]  memantine (NAMENDA) 10 MG tablet Take 1 tablet (10 mg total) by mouth 2 (two) times daily. 10/27/18   Arnetha Courser, MD  metoprolol tartrate (LOPRESSOR) 25 MG tablet TAKE 1 TABLET BY MOUTH 2 TIMES PER DAY 06/25/19   Hubbard Hartshorn, FNP  neomycin-bacitracin-polymyxin (NEOSPORIN) [phone] ointment Apply topically 4 (four) times daily as needed.     [provider]  XARELTO 15 MG TABS tablet TAKE 1 TABLET BY MOUTH ONCE DAILY 08/10/19   Poulose, Bethel Born, NP    Allergies Darvon [propoxyphene hcl]  Family History  Problem Relation Age of Onset  . Heart disease Father   . Gout Brother     Social History Social History   Tobacco Use  . Smoking status: Former Smoker    Packs/day: 0.25    Years: 1.00    Pack years: 0.25    Types: Cigarettes  . Smokeless tobacco: Never Used  Vaping Use  . Vaping Use: Never used  Substance Use Topics  . Alcohol use: No    Alcohol/week: 0.0 standard drinks  . Drug use: No    Review of Systems  Review of Systems  Unable to perform ROS: Dementia     ____________________________________________  PHYSICAL EXAM:      VITAL SIGNS: ED Triage Vitals  Enc Vitals Group     BP 06/18/20 1542 134/72     Pulse Rate 06/18/20 1542 66     Resp 06/18/20 1542 16     Temp 06/18/20 1542 (!) 97.5 F (36.4 C)     Temp Source 06/18/20 1542 Axillary     SpO2 06/18/20 1542 100 %     Weight 06/18/20 1539 145 lb 15.1 oz (66.2 kg)     Height 06/18/20 1539 5\' 4"  (1.626 m)     Head Circumference --      Peak Flow --      Pain Score --      Pain Loc --      Pain Edu? --      Excl. in [place]? --      Physical Exam Vitals and nursing note reviewed.  Constitutional:      General: She is not in acute distress.    Appearance: She is well-developed.  HENT:      Head: Normocephalic and atraumatic.     Comments: Large hematoma to left anterior forehead. No deformity or step off. Minimal TTP. No hemotympanum, no periorbital ecchymoses. Eyes:     Conjunctiva/sclera: Conjunctivae normal.  Cardiovascular:     Rate and Rhythm: Normal rate and regular rhythm.     Heart sounds: Normal heart sounds.  Pulmonary:     Effort: Pulmonary effort is normal. No respiratory distress.  Breath sounds: No wheezing.  Abdominal:     General: There is no distension.  Musculoskeletal:     Cervical back: Neck supple.  Skin:    General: Skin is warm.     Capillary Refill: Capillary refill takes less than 2 seconds.     Findings: No rash.  Neurological:     Mental Status: She is alert. She is disoriented.     Motor: No abnormal muscle tone.     Comments: Disoriented, does not answer questions.        ____________________________________________   LABS (all labs ordered are listed, but only abnormal results are displayed)  Labs Reviewed  BASIC METABOLIC PANEL - Abnormal; Notable for the following components:      Result Value   Glucose, Bld 117 (*)    BUN 25 (*)    Calcium 8.4 (*)    GFR calc non Af Amer 54 (*)    All other components within normal limits  CBC WITH DIFFERENTIAL/PLATELET - Abnormal; Notable for the following components:   WBC 10.9 (*)    Neutro Abs 8.6 (*)    All other components within normal limits    ____________________________________________  EKG: None ________________________________________  RADIOLOGY All imaging, including plain films, CT scans, and ultrasounds, independently reviewed by me, and interpretations confirmed via formal radiology reads.  ED MD interpretation:   CT Head: Forehead hematoma, no acute abnormality CT C Spine: Neg for acute abnormality  Official radiology report(s): CT HEAD WO CONTRAST  Result Date: 06/18/2020 CLINICAL DATA:  Fall with hematoma to forehead EXAM: CT HEAD WITHOUT CONTRAST  TECHNIQUE: Contiguous axial images were obtained from the base of the skull through the vertex without intravenous contrast. COMPARISON:  CT 02/07/2019 FINDINGS: Brain: No acute territorial infarction, hemorrhage, or intracranial mass. Advanced atrophy. Moderate hypodensity in the white matter consistent with chronic small vessel ischemic change. Chronic lacunar infarcts within the basal ganglia. Small chronic infarct in the left cerebellum. Stable ventricle size Vascular: No hyperdense vessels.  Carotid vascular calcification Skull: Normal. Negative for fracture or focal lesion. Sinuses/Orbits: No acute finding. Other: Large forehead hematoma. IMPRESSION: 1. No CT evidence for acute intracranial abnormality. Large forehead hematoma. 2. Atrophy and chronic small vessel ischemic changes of the white matter. Electronically Signed   By: Donavan Foil M.D.   On: 06/18/2020 16:16   CT CERVICAL SPINE WO CONTRAST  Result Date: 06/18/2020 CLINICAL DATA:  Fall EXAM: CT CERVICAL SPINE WITHOUT CONTRAST TECHNIQUE: Multidetector CT imaging of the cervical spine was performed without intravenous contrast. Multiplanar CT image reconstructions were also generated. COMPARISON:  CT 01/08/2019 FINDINGS: Alignment: Trace anterolisthesis C4 on C5. Facet alignment is within normal limits. Skull base and vertebrae: No acute fracture. No primary bone lesion or focal pathologic process. Soft tissues and spinal canal: No prevertebral fluid or swelling. No visible canal hematoma. Disc levels: Diffuse degenerative changes with moderate disc space narrowing at C5-C6 and marked disc space narrowing at C6-C7. Mild disc space narrowing at C2-C3 and C4-C5. Facet degenerative change at multiple levels with multiple level foraminal stenosis, moderate severe at C5-C6 and C6-C7. Moderate to marked foraminal narrowing on the left at C4-C5. Upper chest: Negative. Other: None IMPRESSION: Degenerative changes of the cervical spine. No acute osseous  abnormality. Electronically Signed   By: Donavan Foil M.D.   On: 06/18/2020 16:22    ____________________________________________  PROCEDURES   Procedure(s) performed (including Critical Care):  Procedures  ____________________________________________  INITIAL IMPRESSION / MDM / ASSESSMENT AND PLAN /  ED COURSE  As part of my medical decision making, I reviewed the following data within the [place] notes reviewed and incorporated, Old chart reviewed, Notes from prior ED visits, and [place] Controlled Substance Database       *Jennifer Klein was evaluated in Emergency Department on 06/18/2020 for the symptoms described in the history of present illness. She was evaluated in the context of the global COVID-19 pandemic, which necessitated consideration that the patient might be at risk for infection with the SARS-CoV-2 virus that causes COVID-19. Institutional protocols and algorithms that pertain to the evaluation of patients at risk for COVID-19 are in a state of rapid change based on information released by regulatory bodies including the CDC and federal and state organizations. These policies and algorithms were followed during the patient's care in the ED.  Some ED evaluations and interventions may be delayed as a result of limited staffing during the pandemic.*     Medical Decision Making:  79 yo F here with fall, forehead contusion on blood thinner. At her mental baseline per records. Unable to provide additional history. Imaging neg. Hematoma monitored w/o rapid expansion. No signs of basilar skull fx. Will d/c back to facility with good return precautions. Xarelto dose held tonight and will have them hold tomorrow AM, call her pCP.  ____________________________________________  FINAL CLINICAL IMPRESSION(S) / ED DIAGNOSES  Final diagnoses:  Fall, initial encounter  Contusion of forehead, initial encounter     MEDICATIONS GIVEN DURING THIS  VISIT:  Medications - No data to display   ED Discharge Orders    None       Note:  This document was prepared using Dragon voice recognition software and may include unintentional dictation errors.   Duffy Bruce, MD 06/18/20 2002

## 2020-06-18 NOTE — ED Notes (Addendum)
This RN unable to contact pt husband and [place] x2. Pt unable to sign for discharge due to AMS

## 2020-08-24 ENCOUNTER — Other Ambulatory Visit: Payer: Self-pay

## 2020-08-24 ENCOUNTER — Emergency Department: Payer: Medicare Other

## 2020-08-24 ENCOUNTER — Inpatient Hospital Stay
Admission: EM | Admit: 2020-08-24 | Discharge: 2020-08-29 | DRG: 871 | Disposition: A | Discharge status: Hospice | Payer: Medicare Other | Source: Skilled Nursing Facility | Attending: Internal Medicine | Admitting: Internal Medicine

## 2020-08-24 DIAGNOSIS — I739 Peripheral vascular disease, unspecified: Secondary | ICD-10-CM | POA: Diagnosis present

## 2020-08-24 DIAGNOSIS — Z515 Encounter for palliative care: Secondary | ICD-10-CM | POA: Diagnosis not present

## 2020-08-24 DIAGNOSIS — R1310 Dysphagia, unspecified: Secondary | ICD-10-CM | POA: Diagnosis present

## 2020-08-24 DIAGNOSIS — Z20822 Contact with and (suspected) exposure to covid-19: Secondary | ICD-10-CM | POA: Diagnosis present

## 2020-08-24 DIAGNOSIS — E440 Moderate protein-calorie malnutrition: Secondary | ICD-10-CM | POA: Diagnosis present

## 2020-08-24 DIAGNOSIS — R6520 Severe sepsis without septic shock: Secondary | ICD-10-CM

## 2020-08-24 DIAGNOSIS — Z681 Body mass index (BMI) 19 or less, adult: Secondary | ICD-10-CM | POA: Diagnosis not present

## 2020-08-24 DIAGNOSIS — I129 Hypertensive chronic kidney disease with stage 1 through stage 4 chronic kidney disease, or unspecified chronic kidney disease: Secondary | ICD-10-CM | POA: Diagnosis present

## 2020-08-24 DIAGNOSIS — G9341 Metabolic encephalopathy: Secondary | ICD-10-CM | POA: Diagnosis present

## 2020-08-24 DIAGNOSIS — M810 Age-related osteoporosis without current pathological fracture: Secondary | ICD-10-CM | POA: Diagnosis present

## 2020-08-24 DIAGNOSIS — Z66 Do not resuscitate: Secondary | ICD-10-CM | POA: Diagnosis present

## 2020-08-24 DIAGNOSIS — I4891 Unspecified atrial fibrillation: Secondary | ICD-10-CM

## 2020-08-24 DIAGNOSIS — E870 Hyperosmolality and hypernatremia: Secondary | ICD-10-CM | POA: Diagnosis present

## 2020-08-24 DIAGNOSIS — I480 Paroxysmal atrial fibrillation: Secondary | ICD-10-CM | POA: Diagnosis present

## 2020-08-24 DIAGNOSIS — M109 Gout, unspecified: Secondary | ICD-10-CM | POA: Diagnosis present

## 2020-08-24 DIAGNOSIS — E861 Hypovolemia: Secondary | ICD-10-CM | POA: Diagnosis present

## 2020-08-24 DIAGNOSIS — R4182 Altered mental status, unspecified: Secondary | ICD-10-CM

## 2020-08-24 DIAGNOSIS — I4510 Unspecified right bundle-branch block: Secondary | ICD-10-CM | POA: Diagnosis present

## 2020-08-24 DIAGNOSIS — I10 Essential (primary) hypertension: Secondary | ICD-10-CM | POA: Diagnosis present

## 2020-08-24 DIAGNOSIS — R6521 Severe sepsis with septic shock: Secondary | ICD-10-CM | POA: Diagnosis present

## 2020-08-24 DIAGNOSIS — F419 Anxiety disorder, unspecified: Secondary | ICD-10-CM | POA: Diagnosis present

## 2020-08-24 DIAGNOSIS — Z9183 Wandering in diseases classified elsewhere: Secondary | ICD-10-CM | POA: Diagnosis not present

## 2020-08-24 DIAGNOSIS — N183 Chronic kidney disease, stage 3 unspecified: Secondary | ICD-10-CM | POA: Diagnosis present

## 2020-08-24 DIAGNOSIS — R54 Age-related physical debility: Secondary | ICD-10-CM | POA: Diagnosis present

## 2020-08-24 DIAGNOSIS — F0280 Dementia in other diseases classified elsewhere without behavioral disturbance: Secondary | ICD-10-CM | POA: Diagnosis present

## 2020-08-24 DIAGNOSIS — E876 Hypokalemia: Secondary | ICD-10-CM | POA: Diagnosis not present

## 2020-08-24 DIAGNOSIS — F0281 Dementia in other diseases classified elsewhere with behavioral disturbance: Secondary | ICD-10-CM | POA: Diagnosis present

## 2020-08-24 DIAGNOSIS — Z87891 Personal history of nicotine dependence: Secondary | ICD-10-CM

## 2020-08-24 DIAGNOSIS — G309 Alzheimer's disease, unspecified: Secondary | ICD-10-CM | POA: Diagnosis present

## 2020-08-24 DIAGNOSIS — E860 Dehydration: Secondary | ICD-10-CM | POA: Diagnosis present

## 2020-08-24 DIAGNOSIS — Z79899 Other long term (current) drug therapy: Secondary | ICD-10-CM

## 2020-08-24 DIAGNOSIS — Z7189 Other specified counseling: Secondary | ICD-10-CM | POA: Diagnosis not present

## 2020-08-24 DIAGNOSIS — E785 Hyperlipidemia, unspecified: Secondary | ICD-10-CM | POA: Diagnosis present

## 2020-08-24 DIAGNOSIS — N179 Acute kidney failure, unspecified: Secondary | ICD-10-CM | POA: Diagnosis present

## 2020-08-24 DIAGNOSIS — Z8249 Family history of ischemic heart disease and other diseases of the circulatory system: Secondary | ICD-10-CM

## 2020-08-24 DIAGNOSIS — E538 Deficiency of other specified B group vitamins: Secondary | ICD-10-CM | POA: Diagnosis present

## 2020-08-24 DIAGNOSIS — A419 Sepsis, unspecified organism: Secondary | ICD-10-CM | POA: Diagnosis present

## 2020-08-24 LAB — COMPREHENSIVE METABOLIC PANEL
ALT: 28 U/L (ref 0–44)
AST: 56 U/L — ABNORMAL HIGH (ref 15–41)
Albumin: 4.3 g/dL (ref 3.5–5.0)
Alkaline Phosphatase: 92 U/L (ref 38–126)
Anion gap: 15 (ref 5–15)
BUN: 62 mg/dL — ABNORMAL HIGH (ref 8–23)
CO2: 14 mmol/L — ABNORMAL LOW (ref 22–32)
Calcium: 9.6 mg/dL (ref 8.9–10.3)
Chloride: 120 mmol/L — ABNORMAL HIGH (ref 98–111)
Creatinine, Ser: 2.08 mg/dL — ABNORMAL HIGH (ref 0.44–1.00)
GFR calc Af Amer: 25 mL/min — ABNORMAL LOW (ref >60)
GFR calc non Af Amer: 22 mL/min — ABNORMAL LOW (ref >60)
Glucose, Bld: 137 mg/dL — ABNORMAL HIGH (ref 70–99)
Potassium: 4.3 mmol/L (ref 3.5–5.1)
Sodium: 149 mmol/L — ABNORMAL HIGH (ref 135–145)
Total Bilirubin: 1.8 mg/dL — ABNORMAL HIGH (ref 0.3–1.2)
Total Protein: 8.2 g/dL — ABNORMAL HIGH (ref 6.5–8.1)

## 2020-08-24 LAB — TROPONIN I (HIGH SENSITIVITY)
Troponin I (High Sensitivity): 28 ng/L — ABNORMAL HIGH (ref <18)
Troponin I (High Sensitivity): 25 ng/L — ABNORMAL HIGH (ref <18)

## 2020-08-24 LAB — URINALYSIS, COMPLETE (UACMP) WITH MICROSCOPIC
Bilirubin Urine: NEGATIVE
Glucose, UA: NEGATIVE mg/dL
Ketones, ur: NEGATIVE mg/dL
Nitrite: NEGATIVE
Protein, ur: 30 mg/dL — AB
Specific Gravity, Urine: 1.018 (ref 1.005–1.030)
pH: 5 (ref 5.0–8.0)

## 2020-08-24 LAB — LACTIC ACID, PLASMA
Lactic Acid, Venous: 8.9 mmol/L (ref 0.5–1.9)
Lactic Acid, Venous: 6.2 mmol/L (ref 0.5–1.9)
Lactic Acid, Venous: 3.7 mmol/L (ref 0.5–1.9)
Lactic Acid, Venous: 2.4 mmol/L (ref 0.5–1.9)

## 2020-08-24 LAB — CBC WITH DIFFERENTIAL/PLATELET
Abs Immature Granulocytes: 0.18 10*3/uL — ABNORMAL HIGH (ref 0.00–0.07)
Basophils Absolute: 0.1 10*3/uL (ref 0.0–0.1)
Basophils Relative: 0 %
Eosinophils Absolute: 0.1 10*3/uL (ref 0.0–0.5)
Eosinophils Relative: 0 %
HCT: 53.4 % — ABNORMAL HIGH (ref 36.0–46.0)
Hemoglobin: 16.8 g/dL — ABNORMAL HIGH (ref 12.0–15.0)
Immature Granulocytes: 1 %
Lymphocytes Relative: 7 %
Lymphs Abs: 1.1 10*3/uL (ref 0.7–4.0)
MCH: 29.7 pg (ref 26.0–34.0)
MCHC: 31.5 g/dL (ref 30.0–36.0)
MCV: 94.3 fL (ref 80.0–100.0)
Monocytes Absolute: 0.5 10*3/uL (ref 0.1–1.0)
Monocytes Relative: 3 %
Neutro Abs: 14.3 10*3/uL — ABNORMAL HIGH (ref 1.7–7.7)
Neutrophils Relative %: 89 %
Platelets: 303 10*3/uL (ref 150–400)
RBC: 5.66 MIL/uL — ABNORMAL HIGH (ref 3.87–5.11)
RDW: 14 % (ref 11.5–15.5)
WBC: 16.2 10*3/uL — ABNORMAL HIGH (ref 4.0–10.5)
nRBC: 0 % (ref 0.0–0.2)

## 2020-08-24 LAB — SARS CORONAVIRUS 2 BY RT PCR (HOSPITAL ORDER, PERFORMED IN ~~LOC~~ HOSPITAL LAB): SARS Coronavirus 2: NEGATIVE

## 2020-08-24 MED ORDER — METRONIDAZOLE IN NACL 5-0.79 MG/ML-% IV SOLN
500.0000 mg | Freq: Three times a day (TID) | INTRAVENOUS | Status: DC
  Administered 2020-08-24 – 2020-08-29 (×14): 500 mg via INTRAVENOUS
  Filled 2020-08-24 (×16): qty 100

## 2020-08-24 MED ORDER — ACETAMINOPHEN 650 MG RE SUPP: 650.0000 mg | Freq: Four times a day (QID) | RECTAL | Status: DC | PRN

## 2020-08-24 MED ORDER — RIVAROXABAN 15 MG PO TABS
15.0000 mg | ORAL_TABLET | Freq: Every day | ORAL | Status: DC
  Administered 2020-08-24 – 2020-08-28 (×4): 15 mg via ORAL
  Filled 2020-08-24 (×5): qty 1

## 2020-08-24 MED ORDER — VANCOMYCIN VARIABLE DOSE PER UNSTABLE RENAL FUNCTION (PHARMACIST DOSING): Status: DC

## 2020-08-24 MED ORDER — DILTIAZEM HCL 25 MG/5ML IV SOLN
10.0000 mg | Freq: Once | INTRAVENOUS | Status: AC
  Administered 2020-08-24: 10 mg via INTRAVENOUS
  Filled 2020-08-24: qty 5

## 2020-08-24 MED ORDER — METRONIDAZOLE IN NACL 5-0.79 MG/ML-% IV SOLN
500.0000 mg | Freq: Once | INTRAVENOUS | Status: AC
  Administered 2020-08-24: 500 mg via INTRAVENOUS
  Filled 2020-08-24: qty 100

## 2020-08-24 MED ORDER — VANCOMYCIN HCL IN DEXTROSE 1-5 GM/200ML-% IV SOLN
1000.0000 mg | Freq: Once | INTRAVENOUS | Status: AC
  Administered 2020-08-24: 1000 mg via INTRAVENOUS
  Filled 2020-08-24: qty 200

## 2020-08-24 MED ORDER — ACETAMINOPHEN 325 MG PO TABS
650.0000 mg | ORAL_TABLET | Freq: Four times a day (QID) | ORAL | Status: DC | PRN
  Administered 2020-08-27 (×2): 650 mg via ORAL
  Filled 2020-08-24 (×2): qty 2

## 2020-08-24 MED ORDER — RIVAROXABAN 15 MG PO TABS: 15.0000 mg | ORAL_TABLET | Freq: Every day | ORAL | Status: DC

## 2020-08-24 MED ORDER — LACTATED RINGERS IV BOLUS
1000.0000 mL | Freq: Once | INTRAVENOUS | Status: AC
  Administered 2020-08-24: 1000 mL via INTRAVENOUS

## 2020-08-24 MED ORDER — SODIUM CHLORIDE 0.9 % IV SOLN
2.0000 g | INTRAVENOUS | Status: DC
  Administered 2020-08-25 – 2020-08-26 (×2): 2 g via INTRAVENOUS
  Filled 2020-08-24 (×3): qty 2

## 2020-08-24 MED ORDER — LACTATED RINGERS IV BOLUS
750.0000 mL | Freq: Once | INTRAVENOUS | Status: AC
  Administered 2020-08-24: 750 mL via INTRAVENOUS

## 2020-08-24 MED ORDER — SODIUM CHLORIDE 0.9 % IV SOLN
2.0000 g | Freq: Once | INTRAVENOUS | Status: AC
  Administered 2020-08-24: 2 g via INTRAVENOUS
  Filled 2020-08-24: qty 2

## 2020-08-24 MED ORDER — LACTATED RINGERS IV SOLN: INTRAVENOUS | Status: AC

## 2020-08-24 MED ORDER — DEXTROSE 5 % IV SOLN: INTRAVENOUS | Status: DC

## 2020-08-24 NOTE — ED Triage Notes (Addendum)
Pt here via [facility] from [facility].   Per EMS, unable to obtain spo2/ temp due to patient being so cold. Pt arrives on NRB mask, ems states fire department got spo2 of 78% on RA. On arrival, pt's extremities were purple in color. Per facility, pt usually "wanders", this week she has been unable to get out of bed, and has had increasing lethargy.  cbg 74.

## 2020-08-24 NOTE — H&P (Signed)
History and Physical  Patient Name: Jennifer Klein     BHA:[id number]    DOB: Jun 08, 1941    DOA: 08/24/2020 PCP: Orvis Brill, [facility]  Patient coming from: [facility]  Chief Complaint: Decreased mentation      HPI: Jennifer Klein is a 79 y.o. F with hx advanced dementia, lives in memory care, no longer verbal, Afib on Xarelto and HTN who presents with few days progressive sluggishness, now somnolent and blue.  Caveat that all history collected from triage as the patient is unable to provide independent history due to dementia and altered mental status.  Evidently over the last several days, the patient is been weak, mostly confined to bed.  Today she was unable to get out of bed, sluggish and lethargic, cold and her extremities were blue in color.  EMS placed her on a nonrebreather, and brought her to the emergency room.  In the ER, sodium 149, creatinine 2.08 (from baseline 1.0), bicarb 14 with normal anion gap, WBC 16.2K.  Temp 90 35F core temp, heart rate 133 in A. fib, blood pressure normal.  Lactate 8.9.  Troponin detectable.  CT head unremarkable.  Chest x-ray clear, CT abdomen without contrast unremarkable.  ECG showed rapid A. fib with right bundle branch block.  Patient was started on IV fluids and empiric antibiotics and the hospitalist service were asked to evaluate     ROS: Review of Systems  Unable to perform ROS: Dementia          Past Medical History:  Diagnosis Date  . Alzheimer's dementia with behavioral disturbance ([place]) 05/04/2016  . Alzheimer's dementia without behavioral disturbance ([place]) 05/04/2016  . B12 deficiency 05/06/2016  . Broken nose   . Gout   . Hemorrhoids   . Hyperlipidemia LDL goal <100 05/05/2016  . Hypertension   . Memory loss   . PAF (paroxysmal atrial fibrillation) ([place])    a. on Xarelto; b. 48-hr Holter 2014: NSR with PAF/flutter which happened at least 2.26% of the time w/ associated tachycardia. Patient was  asymptomatic; c. CHADS2VASc => 4 (HTN, age x 2, female)  . Paroxysmal atrial fibrillation ([place]) 08/07/2013  . Senile osteoporosis   . Systolic dysfunction    a. echo 08/2013: EF 50-55%, mild MR, mildly dilated LA    Past Surgical History:  Procedure Laterality Date  . TUBAL LIGATION      Social History: Patient lives in memory care.  The patient walks without a walker, "is a wanderer".  Former smoker.  Allergies  Allergen Reactions  . Darvon [Propoxyphene Hcl]     As a child    Family history: family history includes Gout in her brother; Heart disease in her father.  Prior to Admission medications   Medication Sig Start Date End Date Taking? Authorizing Provider  acetaminophen (TYLENOL) 500 MG tablet Take 500 mg by mouth every 6 (six) hours as needed.    [provider]  allopurinol (ZYLOPRIM) 100 MG tablet Take 1 tablet (100 mg total) by mouth daily. 10/27/18   Arnetha Courser, MD  alum & mag hydroxide-simeth (MI-ACID) 200-200-20 MG/5ML suspension Take 30 mLs by mouth every 6 (six) hours as needed for indigestion or heartburn.    [provider]  atorvastatin (LIPITOR) 20 MG tablet Take 1 tablet (20 mg total) by mouth daily. 02/13/20   Delsa Grana, PA-C  busPIRone (BUSPAR) 5 MG tablet TAKE 1 TABLET BY MOUTH 2 TIMES DAILY FORDEPRESSION Patient taking differently: Take 5 mg  by mouth 2 (two) times daily.  05/30/18   Lada, Satira Anis, MD  cholecalciferol (VITAMIN D) 1000 units tablet TAKE 1 TABLET BY MOUTH ONCE DAILY Patient taking differently: Take 1,000 Units by mouth daily.  04/13/18   Arnetha Courser, MD  clobetasol cream (TEMOVATE) 9.48 % Apply 1 application topically 2 (two) times daily. 07/19/19   Poulose, Bethel Born, NP  donepezil (ARICEPT) 10 MG tablet TAKE 1 TABLET BY MOUTH BEDTIME Patient taking differently: Take 10 mg by mouth at bedtime. TAKE 1 TABLET BY MOUTH BEDTIME 10/27/18   Lada, Satira Anis, MD  GNP VITAMIN B-12 500 MCG tablet TAKE 1 TABLET BY MOUTH EVERY  DAY 07/24/19   Poulose, Bethel Born, NP  guaifenesin (ROBITUSSIN) 100 MG/5ML syrup Take 200 mg by mouth every 6 (six) hours as needed for cough.     [provider]  loperamide (IMODIUM) 2 MG capsule Take 2 mg by mouth as needed for diarrhea or loose stools.    [provider]  LORazepam (ATIVAN) 0.5 MG tablet Take 0.5 tablets (0.25 mg total) by mouth 2 (two) times daily. 05/24/19   Poulose, Bethel Born, NP  magnesium hydroxide (MILK OF MAGNESIA) 400 MG/5ML suspension Take 30 mLs by mouth daily as needed for mild constipation.    [provider]  memantine (NAMENDA) 10 MG tablet Take 1 tablet (10 mg total) by mouth 2 (two) times daily. 10/27/18   Arnetha Courser, MD  metoprolol tartrate (LOPRESSOR) 25 MG tablet TAKE 1 TABLET BY MOUTH 2 TIMES PER DAY 06/25/19   Hubbard Hartshorn, FNP  neomycin-bacitracin-polymyxin (NEOSPORIN) [phone] ointment Apply topically 4 (four) times daily as needed.     [provider]  XARELTO 15 MG TABS tablet TAKE 1 TABLET BY MOUTH ONCE DAILY 08/10/19   Poulose, Bethel Born, NP       Physical Exam: BP (!) 152/96   Pulse (!) 101   Temp (!) 96 F (35.6 C) (Rectal)   Resp 14   Ht 5\' 4"  (1.626 m)   Wt 50 kg   SpO2 99%   BMI 18.92 kg/m  General appearance: Frail, thin, elderly adult female, eyes open, but staring, does not make eye contact.  Appears uncomfortable but in no obvious distress.   Eyes: Anicteric, conjunctiva pink, lids and lashes normal. PERRL.    ENT: No nasal deformity, discharge, epistaxis.  Hearing unable to assess. OP tacky dry without lesions.   Neck: No neck masses, no tracheal deviation, no thyromegaly. Lymph: No cervical or supraclavicular lymphadenopathy. Skin: Warm and dry.  No jaundice.  I do not appreciate mottling or livedo.  No suspicious rashes or lesions. Cardiac: Tachycardic, irregular, nl S1-S2, no murmurs appreciated.  Capillary refill less than 3 seconds.  JVP not visible.  No LE edema.  Radial pulses 2+  and symmetric. Respiratory: Normal respiratory rate and rhythm.  Poor inspiratory effort, I do not appreciate rales or wheezes Abdomen: Abdomen soft.  Diffuse TTP with voluntary guarding. No ascites, distension, hepatosplenomegaly.   MSK: No deformities or effusions of the large joints of the upper or lower extremities bilaterally.  No cyanosis or clubbing.  Diffuse severe loss of subcutaneous muscle mass and fat Neuro: Awake, makes eye contact to prompting, then looks away, no intelligible answers to questions, does not follow commands.  Strength appears to be generally weak, but symmetric to resist me.   Psych: Advanced dementia, patient makes no intelligible responses to questions.     Labs on Admission:  I  have personally reviewed following labs and imaging studies: CBC: Recent Labs  Lab 08/24/20 1339  WBC 16.2*  NEUTROABS 14.3*  HGB 16.8*  HCT 53.4*  MCV 94.3  PLT 144   Basic Metabolic Panel: Recent Labs  Lab 08/24/20 1339  NA 149*  K 4.3  CL 120*  CO2 14*  GLUCOSE 137*  BUN 62*  CREATININE 2.08*  CALCIUM 9.6   GFR: Estimated Creatinine Clearance: 17 mL/min (A) (by C-G formula based on SCr of 2.08 mg/dL (H)).  Liver Function Tests: Recent Labs  Lab 08/24/20 1339  AST 56*  ALT 28  ALKPHOS 92  BILITOT 1.8*  PROT 8.2*  ALBUMIN 4.3   Sepsis Labs: Lactic acid 8.9-->6.2         Radiological Exams on Admission: Personally reviewed CT abdomen pelvis and head without contrast reports, showing no acute disease; chest x-ray personally reviewed, shows no pneumonia, pulmonary edema, pneumothorax: CT Abdomen Pelvis Wo Contrast  Result Date: 08/24/2020 CLINICAL DATA:  Diffuse abdominal pain EXAM: CT ABDOMEN AND PELVIS WITHOUT CONTRAST TECHNIQUE: Multidetector CT imaging of the abdomen and pelvis was performed following the standard protocol without IV contrast. COMPARISON:  06/23/2018 FINDINGS: Lower chest: The visualized lung bases are clear bilaterally. Mild  calcification of the mitral valve annulus. Cardiac size within normal limits. Tiny hiatal hernia. Hepatobiliary: Cholelithiasis without superimposed pericholecystic inflammatory change. Heterogeneity of the liver parenchyma likely relates to beam hardening from adjacent osseous structures. The liver is otherwise unremarkable. No intra or extrahepatic biliary ductal dilation. Pancreas: Unremarkable Spleen: Unremarkable Adrenals/Urinary Tract: Adrenal glands are unremarkable. Kidneys are normal. Bladder is unremarkable. Stomach/Bowel: Stomach, small bowel, and large bowel are unremarkable save for mild sigmoid diverticulosis. Appendix normal. No free intraperitoneal gas or fluid. Vascular/Lymphatic: The abdominal vasculature is age-appropriate with moderate aortoiliac atherosclerotic calcification. No aneurysm. No pathologic adenopathy within the abdomen and pelvis. Reproductive: Uterus and bilateral adnexa are unremarkable. Other: The rectum is unremarkable. Musculoskeletal: Degenerative changes are seen within the lumbar spine. No acute bone abnormality. IMPRESSION: 1. No definite radiographic explanation for the patient's reported symptoms. 2. Cholelithiasis without superimposed pericholecystic inflammatory change. 3. Mild sigmoid diverticulosis. Aortic Atherosclerosis (ICD10-I70.0). Electronically Signed   By: Fidela Salisbury MD   On: 08/24/2020 16:48   CT Head Wo Contrast  Result Date: 08/24/2020 CLINICAL DATA:  Increasing lethargy, mental status change. EXAM: CT HEAD WITHOUT CONTRAST TECHNIQUE: Contiguous axial images were obtained from the base of the skull through the vertex without intravenous contrast. COMPARISON:  Head CT dated 06/18/2020 FINDINGS: Brain: Generalized age related parenchymal volume loss with commensurate dilatation of the ventricles and sulci, stable. Chronic small vessel ischemic changes again noted within the bilateral periventricular and subcortical white matter regions, stable. Small  old lacunar infarcts again noted within the RIGHT basal ganglia region. There is no mass, hemorrhage, edema or other evidence of acute parenchymal abnormality. No extra-axial hemorrhage. Vascular: No hyperdense vessel or unexpected calcification. Skull: Normal. Negative for fracture or focal lesion. Sinuses/Orbits: No acute finding. Other: None. IMPRESSION: 1. No acute findings. No intracranial mass, hemorrhage or edema. 2. Chronic small vessel ischemic changes in the white matter and RIGHT basal ganglia. Electronically Signed   By: Franki Cabot M.D.   On: 08/24/2020 14:47   DG Chest Portable 1 View  Result Date: 08/24/2020 CLINICAL DATA:  Altered mental status EXAM: PORTABLE CHEST 1 VIEW COMPARISON:  Chest x-rays dated 02/07/2019 and 01/08/2019. FINDINGS: Heart size and mediastinal contours are within normal limits. Lungs are clear. No pleural effusion. Osseous  structures about the chest are unremarkable. IMPRESSION: No active disease. No evidence of pneumonia or pulmonary edema. Electronically Signed   By: Franki Cabot M.D.   On: 08/24/2020 14:27    EKG: Independently reviewed.  EKG rate 140s, A. fib, RBBB.       Assessment/Plan   Septic shock, presumed Patient presents with tachycardia, hypothermia, leukocytosis, lactate 8.9.  Source unclear. -Continue vancomycin, cefepime, Flagyl -Follow blood and urine cultures   Hypernatremia Dehydration -Start hypotonic fluids, D5W at 75 cc/h to correct rate 1/2 mmol/L/hr  Acute metabolic encephalopathy superimposed on advanced dementia Due to dehydration/hypernatremia or septic shock, imposed on advanced dementia  Paroxysmal atrial fibrillation with RVR BP elevated. Given diltaizem x1 in ER with some improvement.  Will give repeat dose, this time metoprolol IV 5 mg once and then resume home metoprolol at increased dose. -Continue metoprolol -Continue Xarelto  Acute renal failure Due to dehyeration -IV fluids and monitor Cr  Dementia   Anxiety -Hold Buspar, Ativan, memantine until more alert  Goals of Care  Spoke with husband and son-in-law. Confirmed DNR, and aggressive invasive measures like central lines and pressors or dialysis would not be congruent with her goals of care.  I have recommended Hospice, and family will discuss this.   -Consult Palliative Care -If hypotonic fluids and correction of eletrolytes and renal function does not return the patient towards her previous level of function, I would recommend withdrawal of IV fluids after a 24 hours trial and referral to Hospice; husband is in [place] but not local and would need some time to return to the bedside   DVT prophylaxis: Xarelto  Code Status: DNR  Family Communication: Husband and son in law, best reached at son in law phone  Disposition Plan: Anticipate IV fluids for 24 hours, palliative care consultation and monitoring clinical status.  In the setting of her advanced dementia, if the patient does not appear to be progressing back to her previous level of function, I would recommend [facility] referral Consults called: Palliative Admission status: INPATIENT   At the time of admission, it appears that the appropriate admission status for this patient is INPATIENT. This is judged to be reasonable and necessary in order to provide the required intensity of service to ensure the patient's safety given: -presenting symptoms of lethargy, pallor -physical exam findings of decreased mentation, tachycardia, and  -initial radiographic and laboratory data renal failure, hypernatremia, elevated latic acid -in the context of their chronic comorbidities advanced dementia    Together, these circumstances are felt to place her at high risk for further clinical deterioration threatening life, limb, or organ requiring a high intensity of service due to this acute illness that poses a threat to life, limb or bodily function.  I certify that at the point of  admission it is my clinical judgment that the patient will require inpatient hospital care spanning beyond 2 midnights from the point of admission and that early discharge would result in unnecessary risk of decompensation and readmission or threat to life, limb or bodily function.   Medical decision making: Patient seen at 5:59 PM on 08/24/2020.  The patient was discussed with Dr. Quentin Cornwall.  What exists of the patient's chart was reviewed in depth and summarized above.  Clinical condition: mentation low, HR still elevated but BP and respiratory status stable.  Likely progressing to end of life.  Will start medical treatments and monitor status.        [facility] Please  page though [place] or Epic secure chat:  For password, contact charge nurse

## 2020-08-24 NOTE — ED Provider Notes (Signed)
[facility] Emergency Department Provider Note   ____________________________________________   First MD Initiated Contact with Patient 08/24/20 1335     (approximate)  I have reviewed the triage vital signs and the nursing notes.   HISTORY  Chief Complaint Altered Mental status   HPI Jennifer Klein is a 79 y.o. female with possible history of dementia, atrial fibrillation on Xarelto, hypertension, and hyperlipidemia who presents to the ED for altered mental status.  Per EMS, staff at patient's nursing facility called out when they found her less alert than usual with blue extremities.  Patient was very cold to touch upon EMS arrival and they were unable to obtain an accurate O2 sat.  Patient was placed on nonrebreather and brought to the ED.  On arrival, she is somnolent and unable to provide any history.  EMS states that at her baseline she is nonverbal but typically awake, alert, and ambulatory.  Staff and reportedly been concerned about her waning mental status over the past couple of days and had sent off a urine sample for testing but had not yet heard back on results.        Past Medical History:  Diagnosis Date  . Alzheimer's dementia with behavioral disturbance ([place]) 05/04/2016  . Alzheimer's dementia without behavioral disturbance ([place]) 05/04/2016  . B12 deficiency 05/06/2016  . Broken nose   . Gout   . Hemorrhoids   . Hyperlipidemia LDL goal <100 05/05/2016  . Hypertension   . Memory loss   . PAF (paroxysmal atrial fibrillation) ([place])    a. on Xarelto; b. 48-hr Holter 2014: NSR with PAF/flutter which happened at least 2.26% of the time w/ associated tachycardia. Patient was asymptomatic; c. CHADS2VASc => 4 (HTN, age x 2, female)  . Paroxysmal atrial fibrillation ([place]) 08/07/2013  . Senile osteoporosis   . Systolic dysfunction    a. echo 08/2013: EF 50-55%, mild MR, mildly dilated LA    Patient Active Problem List   Diagnosis Date Noted  .  DNAR (do not attempt resuscitation) 02/24/2019  . PAD (peripheral artery disease) ([place]) 06/22/2017  . Chronic venous insufficiency 06/22/2017  . CKD (chronic kidney disease) stage 3, GFR 30-59 ml/min 09/04/2016  . B12 deficiency 05/06/2016  . Hyperlipidemia LDL goal <100 05/05/2016  . Alzheimer's dementia without behavioral disturbance ([place]) 05/04/2016  . Barrett's esophagus 11/09/2015  . Paroxysmal atrial fibrillation ([place]) 08/07/2013  . Osteopenia 08/08/2012  . Hypertension 06/03/2012  . Gout 06/03/2012    Past Surgical History:  Procedure Laterality Date  . TUBAL LIGATION      Prior to Admission medications   Medication Sig Start Date End Date Taking? Authorizing Provider  acetaminophen (TYLENOL) 500 MG tablet Take 500 mg by mouth every 6 (six) hours as needed.    [provider]  allopurinol (ZYLOPRIM) 100 MG tablet Take 1 tablet (100 mg total) by mouth daily. 10/27/18   Arnetha Courser, MD  alum & mag hydroxide-simeth (MI-ACID) 200-200-20 MG/5ML suspension Take 30 mLs by mouth every 6 (six) hours as needed for indigestion or heartburn.    [provider]  atorvastatin (LIPITOR) 20 MG tablet Take 1 tablet (20 mg total) by mouth daily. 02/13/20   Delsa Grana, PA-C  busPIRone (BUSPAR) 5 MG tablet TAKE 1 TABLET BY MOUTH 2 TIMES DAILY FORDEPRESSION Patient taking differently: Take 5 mg by mouth 2 (two) times daily.  05/30/18   Arnetha Courser, MD  cholecalciferol (VITAMIN D) 1000 units tablet TAKE 1 TABLET BY MOUTH  ONCE DAILY Patient taking differently: Take 1,000 Units by mouth daily.  04/13/18   Arnetha Courser, MD  clobetasol cream (TEMOVATE) 6.22 % Apply 1 application topically 2 (two) times daily. 07/19/19   Poulose, Bethel Born, NP  donepezil (ARICEPT) 10 MG tablet TAKE 1 TABLET BY MOUTH BEDTIME Patient taking differently: Take 10 mg by mouth at bedtime. TAKE 1 TABLET BY MOUTH BEDTIME 10/27/18   Lada, Satira Anis, MD  GNP VITAMIN B-12 500 MCG tablet TAKE 1 TABLET BY  MOUTH EVERY DAY 07/24/19   Poulose, Bethel Born, NP  guaifenesin (ROBITUSSIN) 100 MG/5ML syrup Take 200 mg by mouth every 6 (six) hours as needed for cough.     [provider]  loperamide (IMODIUM) 2 MG capsule Take 2 mg by mouth as needed for diarrhea or loose stools.    [provider]  LORazepam (ATIVAN) 0.5 MG tablet Take 0.5 tablets (0.25 mg total) by mouth 2 (two) times daily. 05/24/19   Poulose, Bethel Born, NP  magnesium hydroxide (MILK OF MAGNESIA) 400 MG/5ML suspension Take 30 mLs by mouth daily as needed for mild constipation.    [provider]  memantine (NAMENDA) 10 MG tablet Take 1 tablet (10 mg total) by mouth 2 (two) times daily. 10/27/18   Arnetha Courser, MD  metoprolol tartrate (LOPRESSOR) 25 MG tablet TAKE 1 TABLET BY MOUTH 2 TIMES PER DAY 06/25/19   Hubbard Hartshorn, FNP  neomycin-bacitracin-polymyxin (NEOSPORIN) [phone] ointment Apply topically 4 (four) times daily as needed.     [provider]  XARELTO 15 MG TABS tablet TAKE 1 TABLET BY MOUTH ONCE DAILY 08/10/19   Poulose, Bethel Born, NP    Allergies Darvon [propoxyphene hcl]  Family History  Problem Relation Age of Onset  . Heart disease Father   . Gout Brother     Social History Social History   Tobacco Use  . Smoking status: Former Smoker    Packs/day: 0.25    Years: 1.00    Pack years: 0.25    Types: Cigarettes  . Smokeless tobacco: Never Used  Vaping Use  . Vaping Use: Never used  Substance Use Topics  . Alcohol use: No    Alcohol/week: 0.0 standard drinks  . Drug use: No    Review of Systems Unable to obtain secondary to altered mental status.  ____________________________________________   PHYSICAL EXAM:  VITAL SIGNS: ED Triage Vitals  Enc Vitals Group     BP      Pulse      Resp      Temp      Temp src      SpO2      Weight      Height      Head Circumference      Peak Flow      Pain Score      Pain Loc      Pain Edu?      Excl. in [place]?      Constitutional: Somnolent but arousable to voice, nonverbal. Eyes: Conjunctivae are normal.  Pupils equal round and reactive to light bilaterally. Head: Atraumatic. Nose: No congestion/rhinnorhea. Mouth/Throat: Mucous membranes are dry. Neck: Normal ROM Cardiovascular: Tachycardic, irregularly irregular rhythm. Grossly normal heart sounds. Respiratory: Normal respiratory effort.  No retractions. Lungs CTAB. Gastrointestinal: Soft and nondistended. Genitourinary: deferred Musculoskeletal: No lower extremity tenderness nor edema. Neurologic:  No gross focal neurologic deficits are appreciated. Skin: Cool and mottled extremities. Psychiatric: Mood and affect are normal. Speech and behavior  are normal.  ____________________________________________   LABS (all labs ordered are listed, but only abnormal results are displayed)  Labs Reviewed  LACTIC ACID, PLASMA - Abnormal; Notable for the following components:      Result Value   Lactic Acid, Venous 8.9 (*)    All other components within normal limits  COMPREHENSIVE METABOLIC PANEL - Abnormal; Notable for the following components:   Sodium 149 (*)    Chloride 120 (*)    CO2 14 (*)    Glucose, Bld 137 (*)    BUN 62 (*)    Creatinine, Ser 2.08 (*)    Total Protein 8.2 (*)    AST 56 (*)    Total Bilirubin 1.8 (*)    GFR calc non Af Amer 22 (*)    GFR calc Af Amer 25 (*)    All other components within normal limits  CBC WITH DIFFERENTIAL/PLATELET - Abnormal; Notable for the following components:   WBC 16.2 (*)    RBC 5.66 (*)    Hemoglobin 16.8 (*)    HCT 53.4 (*)    Neutro Abs 14.3 (*)    Abs Immature Granulocytes 0.18 (*)    All other components within normal limits  TROPONIN I (HIGH SENSITIVITY) - Abnormal; Notable for the following components:   Troponin I (High Sensitivity) 28 (*)    All other components within normal limits  CULTURE, BLOOD (ROUTINE X 2)  CULTURE, BLOOD (ROUTINE X 2)  LACTIC ACID, PLASMA   URINALYSIS, COMPLETE (UACMP) WITH MICROSCOPIC  TROPONIN I (HIGH SENSITIVITY)   ____________________________________________  EKG  ED ECG REPORT I, Blake Divine, the attending physician, personally viewed and interpreted this ECG.   Date: 08/24/2020  EKG Time: 13:35  Rate: 141  Rhythm: atrial fibrillation, rate 141  Axis: Normal  Intervals:none  ST&T Change: None   PROCEDURES  Procedure(s) performed (including Critical Care):  .Critical Care Performed by: Blake Divine, MD Authorized by: Blake Divine, MD   Critical care provider statement:    Critical care time (minutes):  45   Critical care time was exclusive of:  Separately billable procedures and treating other patients and teaching time   Critical care was necessary to treat or prevent imminent or life-threatening deterioration of the following conditions:  Sepsis and cardiac failure   Critical care was time spent personally by me on the following activities:  Discussions with consultants, evaluation of patient's response to treatment, examination of patient, ordering and performing treatments and interventions, ordering and review of laboratory studies, ordering and review of radiographic studies, pulse oximetry, re-evaluation of patient's condition, obtaining history from patient or surrogate and review of old charts   I assumed direction of critical care for this patient from another provider in my specialty: no       ____________________________________________   INITIAL IMPRESSION / ASSESSMENT AND PLAN / ED COURSE       79 year old female with past medical history of dementia, atrial fibrillation on Xarelto, hypertension, and hyperlipidemia presents to the ED for waning mental status over the past couple of days.  There was initial concern for respiratory distress and patient was placed on nonrebreather by EMS, however on arrival she is not having any apparent breathing difficulty, O2 sats within normal  limits on room air, but she does appear to altered from her baseline.  She is somnolent but arousable to voice, nonverbal which is her baseline.  She does appear to be in atrial fibrillation with RVR, however I am also concerned for  sepsis given her hypothermia and leukocytosis.  We will start with resuscitation for sepsis and give small dose of IV diltiazem to assist with rate control.  She is normotensive at this time, no clear source for sepsis and we will start broad-spectrum antibiotics.  Patient now more alert and opening eyes spontaneously, heart rate is improved and extremities seem more well-perfused.  Initial abdominal exam was difficult due to patient's somnolence, but now she does seem to have diffuse tenderness and we will further assess with CT scan.  Patient noted to have marked lactic acidosis and we will trend.  We were unable to obtain urine sample on initial straight cath and I suspect patient is severely dehydrated.  Patient turned over to oncoming provider pending CT results, trending of lactate, and further management of heart rate.  If lactate is downtrending and vital signs remained stable then patient would be appropriate for admission to stepdown.      ____________________________________________   FINAL CLINICAL IMPRESSION(S) / ED DIAGNOSES  Final diagnoses:  Sepsis with acute renal failure without septic shock, due to unspecified organism, unspecified acute renal failure type ([place])  Altered mental status, unspecified altered mental status type  Atrial fibrillation with RVR [facility])     ED Discharge Orders    None       Note:  This document was prepared using Dragon voice recognition software and may include unintentional dictation errors.   Blake Divine, MD 08/24/20 [phone]

## 2020-08-24 NOTE — Progress Notes (Signed)
PHARMACY -  BRIEF ANTIBIOTIC NOTE   Pharmacy has received consult(s) for Cefepime and Vancomycin from an ED provider.  The patient's profile has been reviewed for ht/wt/allergies/indication/available labs.    One time order(s) placed for Cefepime, vancomycin, and metronidazole  Further antibiotics/pharmacy consults should be ordered by admitting physician if indicated.                       Thank you, Sherilyn Banker, PharmD Pharmacy Resident  08/24/2020 2:40 PM

## 2020-08-24 NOTE — Progress Notes (Signed)
CODE SEPSIS - PHARMACY COMMUNICATION  **Broad Spectrum Antibiotics should be administered within 1 hour of Sepsis diagnosis**  Time Code Sepsis Called/Page Received: 1436  Antibiotics Ordered:  Cefepime 2g IV Vancomycin 1000mg  IV Metronidazole 500mg  IV  Time of 1st antibiotic administration: 1521  Additional action taken by pharmacy: Called RN at [place], PharmD Pharmacy Resident  08/24/2020 2:54 PM

## 2020-08-24 NOTE — Consult Note (Signed)
Pharmacy Antibiotic Note  Jennifer Klein is a 79 y.o. female with medical history including dementia, atrial fibrillation on rivaroxaban, hypertension, and hyperlipidemia admitted on 08/24/2020 with sepsis.  Pharmacy has been consulted for cefepime and vancomycin dosing. Patient is also ordered metronidazole.  Labs on admission notable for Scr of 2.08 (last Scr was 0.98 on 06/18/20), WBC 16.2 with left shift, lactic acid 8.9. Vitals with hypothermia, tachycardia, hypertension.  Plan: Cefepime 2 g q24h (renally adjusted)  Patient received vancomycin 1 g x 1 in the ED (20 mg/kg)  Will dose per levels for now based on unstable renal function. Daily Scr per protocol. Pharmacy will continue to monitor and adjust antibiotic dosing as indicated.   Height: 5\' 4"  (162.6 cm) Weight: 50 kg (110 lb 3.7 oz) IBW/kg (Calculated) : 54.7  Temp (24hrs), Avg:96 F (35.6 C), Min:96 F (35.6 C), Max:96 F (35.6 C)  Recent Labs  Lab 08/24/20 1339 08/24/20 1615  WBC 16.2*  --   CREATININE 2.08*  --   LATICACIDVEN 8.9* 6.2*    Estimated Creatinine Clearance: 17 mL/min (A) (by C-G formula based on SCr of 2.08 mg/dL (H)).    Allergies  Allergen Reactions  . Darvon [Propoxyphene Hcl]     As a child    Antimicrobials this admission: Cefepime 9/4 >>  Metronidazole 9/4 >>  Vancomycin 9/4 >>   Dose adjustments this admission: n/a  Microbiology results: 9/4 BCx: pending 9/4 UCx: pending  9/4 SARS-CoV-2: pending  Thank you for allowing pharmacy to be a part of this patient's care.  Benita Gutter 08/24/2020 6:00 PM

## 2020-08-24 NOTE — ED Notes (Signed)
Pt in CT.

## 2020-08-25 DIAGNOSIS — R4182 Altered mental status, unspecified: Secondary | ICD-10-CM | POA: Diagnosis present

## 2020-08-25 LAB — CBC
HCT: 38.9 % (ref 36.0–46.0)
Hemoglobin: 12.7 g/dL (ref 12.0–15.0)
MCH: 29.8 pg (ref 26.0–34.0)
MCHC: 32.6 g/dL (ref 30.0–36.0)
MCV: 91.3 fL (ref 80.0–100.0)
Platelets: 186 10*3/uL (ref 150–400)
RBC: 4.26 MIL/uL (ref 3.87–5.11)
RDW: 13.9 % (ref 11.5–15.5)
WBC: 13.3 10*3/uL — ABNORMAL HIGH (ref 4.0–10.5)
nRBC: 0 % (ref 0.0–0.2)

## 2020-08-25 LAB — BASIC METABOLIC PANEL
Anion gap: 7 (ref 5–15)
BUN: 62 mg/dL — ABNORMAL HIGH (ref 8–23)
CO2: 22 mmol/L (ref 22–32)
Calcium: 8.3 mg/dL — ABNORMAL LOW (ref 8.9–10.3)
Chloride: 118 mmol/L — ABNORMAL HIGH (ref 98–111)
Creatinine, Ser: 1.86 mg/dL — ABNORMAL HIGH (ref 0.44–1.00)
GFR calc Af Amer: 29 mL/min — ABNORMAL LOW (ref >60)
GFR calc non Af Amer: 25 mL/min — ABNORMAL LOW (ref >60)
Glucose, Bld: 120 mg/dL — ABNORMAL HIGH (ref 70–99)
Potassium: 3.2 mmol/L — ABNORMAL LOW (ref 3.5–5.1)
Sodium: 147 mmol/L — ABNORMAL HIGH (ref 135–145)

## 2020-08-25 LAB — LACTIC ACID, PLASMA: Lactic Acid, Venous: 1.3 mmol/L (ref 0.5–1.9)

## 2020-08-25 MED ORDER — VANCOMYCIN HCL 750 MG/150ML IV SOLN
750.0000 mg | INTRAVENOUS | Status: DC
  Administered 2020-08-26: 750 mg via INTRAVENOUS
  Filled 2020-08-25 (×2): qty 150

## 2020-08-25 NOTE — ED Notes (Signed)
Pt is dry, turned from slightly left side to back.

## 2020-08-25 NOTE — ED Notes (Signed)
Report to ariel, rn.  

## 2020-08-25 NOTE — ED Notes (Signed)
Lab called to collect lactic 

## 2020-08-25 NOTE — ED Notes (Signed)
Report given to Cameron, RN.

## 2020-08-25 NOTE — Consult Note (Signed)
Pharmacy Antibiotic Note  Jennifer Klein is a 79 y.o. female with medical history including dementia, atrial fibrillation on rivaroxaban, hypertension, and hyperlipidemia admitted on 08/24/2020 with sepsis.  Pharmacy was consulted for cefepime and vancomycin dosing. Patient is also ordered metronidazole. Since admission her renal function has improved, although not yet back to her baseline level   Plan:  1) continue cefepime 2 grams IV every 24 hours  2) start vancomycin 750 mg IV every 48 hours  Ke: 0.02 h-1, T1/2: 34.3 h  Css (calculated): 33.6/13.1 mcg/mL  SCr daily to assess renal function while on vancomycin  Vancomycin levels as clinically indicated   Height: 5\' 4"  (162.6 cm) Weight: 50 kg (110 lb 3.7 oz) IBW/kg (Calculated) : 54.7  Temp (24hrs), Avg:97.4 F (36.3 C), Min:96 F (35.6 C), Max:98 F (36.7 C)  Recent Labs  Lab 08/24/20 1339 08/24/20 1615 08/24/20 1854 08/24/20 2206 08/25/20 0415  WBC 16.2*  --   --   --  13.3*  CREATININE 2.08*  --   --   --  1.86*  LATICACIDVEN 8.9* 6.2* 3.7* 2.4*  --     Estimated Creatinine Clearance: 19 mL/min (A) (by C-G formula based on SCr of 1.86 mg/dL (H)).    Antimicrobials this admission: Cefepime 9/4 >>  Metronidazole 9/4 >>  Vancomycin 9/4 >>   Microbiology results: 9/4 BCx: NG < 24h 9/4 UCx: pending  9/4 SARS-CoV-2: negative  Thank you for allowing pharmacy to be a part of this patient's care.  Dallie Piles 08/25/2020 1:30 PM

## 2020-08-25 NOTE — Progress Notes (Signed)
PROGRESS NOTE    Patient: Jennifer Klein                            PCP: [facility]                    DOB: 07/04/1941            DOA: 08/24/2020 [id number]             DOS: 08/25/2020, 8:16 AM   LOS: 1 day   Date of Service: The patient was seen and examined on 08/25/2020  Subjective:   The patient was seen and examined this Am. Unresponsive--withdraws to pain stimuli Nonverbal Hemodynamically stable  Brief Narrative:   Per HPI:   Jennifer Klein is a 79 y.o. F with hx advanced Dementia, lives in memory care, no longer verbal, A-fib on Xarelto and HTN who presents with few days progressive sluggishness, now somnolent and blue.  History was collected from triage as the patient is unable to provide independent history due to dementia and altered mental status.  Evidently over the last several days, the patient is been weak, mostly confined to bed. On admission day she was unable to get out of bed, sluggish and lethargic, cold and her extremities were blue in color.   EMS placed her on a nonrebreather, and brought her to the emergency room.  ER:  Sodium 149, creatinine 2.08 (from baseline 1.0), bicarb 14 with normal anion gap, WBC 16.2K.  Temp 90.91F core temp, heart rate 133 in A. fib, blood pressure normal.  Lactate 8.9.  Troponin detectable.   CT head unremarkable.  Chest x-ray clear, CT abdomen without contrast unremarkable.   ECG showed rapid A. fib with right bundle branch block.  Patient was started on IV fluids and empiric antibiotics and the hospitalist service were asked to evaluate  Assessment & Plan:   Principal Problem:   Septic shock ([place]) Active Problems:   Hypertension   Paroxysmal atrial fibrillation (HCC)   Alzheimer's dementia without behavioral disturbance (HCC)   CKD (chronic kidney disease) stage 3, GFR 30-59 ml/min   PAD (peripheral artery disease) (HCC)    Septic shock, sepsis-presumed -Septic shock physiology improved (improved  tachycardia, tachypnea,) -Hemodynamically stable this morning blood pressure 140/80, satting 99% on room air -Status post aggressive IV fluid resuscitation -Lactic acid 8.9 >>> 2.4 now -Source of infection likely urinary -We will continue with the broad-spectrum antibiotic started vancomycin, cefepime, Flagyl for today -We will follow up with blood/urine cultures accordingly   Hypernatremia - Dehydration -Start hypotonic fluids, D5W at 75 cc/h to correct rate 1/2 mmol/L/hr -Sodium 149-147  Hypokalemia -3.2, repleteding  Acute renal failure -due to sepsis/septic shock -dehydration-hypovolemia -Continue with IV fluids -Monitoring BUN/creatinine -Creatinine:  Monitoring I's and O's Avoid nephrotoxins  Acute metabolic encephalopathy superimposed on advanced dementia Due to dehydration/hypernatremia or septic shock, imposed on advanced dementia Remains lethargic, unresponsive, withdraws to pain stimuli, nonverbal  Paroxysmal atrial fibrillation with RVR -Stable Given diltaizem x1 in ER with some improvement.  Will give repeat dose, this time metoprolol IV 5 mg once and then resume home metoprolol at increased dose. -Continue metoprolol -Continue Xarelto    Dementia / Anxiety -Hold Buspar, Ativan, memantine until more alert  Goals of Care  Spoke with husband and son-in-law. Confirmed DNR, and aggressive invasive measures like central lines and pressors or dialysis would not be congruent with her goals of care.  We are recommending hospice, and family will discuss this.   -[place] -If hypotonic fluids and correction of eletrolytes and renal function does not return the patient towards her previous level of function, I would recommend withdrawal of IV fluids after a 24 hours trial and referral to Hospice; husband is in [place] but not local and would need some time to return to the bedside      Nutritional status:             Cultures; Blood Cultures x 2 >> Urine Culture  >>>    Antimicrobials:  Cefepime 2g IV Vancomycin 1000mg  IV Metronidazole 500mg  IV   Consultants: Palliative care   ---------------------------------------------------------------------------------------------------------------------------------------------   DVT prophylaxis: Xarelto  Code Status: DNR  Family Communication: Husband and son in law, best reached at son in law phone  Disposition Plan: Anticipate IV fluids for 24 hours, palliative care consultation and monitoring clinical status.  In the setting of her advanced dementia, if the patient does not appear to be progressing back to her previous level of function, we are recommending [facility] referral Consults called: Palliative     Admission status:    Status is: Inpatient  Remains inpatient appropriate because:Inpatient level of care appropriate due to severity of illness   Dispo: The patient is from: Memory unit              Anticipated d/c is to: SNF -memory unit with palliative care-versus hospice-hospice home              Anticipated d/c date is: 3 days              Patient currently is not medically stable to d/c.        Procedures:   No admission procedures for hospital encounter.     Antimicrobials:  Anti-infectives (From admission, onward)   Start     Dose/Rate Route Frequency Ordered Stop   08/25/20 1500  ceFEPIme (MAXIPIME) 2 g in sodium chloride 0.9 % 100 mL IVPB        2 g 200 mL/hr over 30 Minutes Intravenous Every 24 hours 08/24/20 1808     08/25/20 0000  metroNIDAZOLE (FLAGYL) IVPB 500 mg        500 mg 100 mL/hr over 60 Minutes Intravenous Every 8 hours 08/24/20 1743     08/24/20 1808  vancomycin variable dose per unstable renal function (pharmacist dosing)         Does not apply See admin instructions 08/24/20 1808     08/24/20 1445  ceFEPIme (MAXIPIME) 2 g in sodium chloride 0.9 % 100 mL IVPB        2 g 200 mL/hr over  30 Minutes Intravenous  Once 08/24/20 1436 08/24/20 1546   08/24/20 1445  metroNIDAZOLE (FLAGYL) IVPB 500 mg        500 mg 100 mL/hr over 60 Minutes Intravenous  Once 08/24/20 1436 08/24/20 1646   08/24/20 1445  vancomycin (VANCOCIN) IVPB 1000 mg/200 mL premix        1,000 mg 200 mL/hr over 60 Minutes Intravenous  Once 08/24/20 1436 08/24/20 1800       Medication:  . Rivaroxaban  15 mg Oral Q supper  . vancomycin variable dose per unstable renal function (pharmacist dosing)   Does not apply See admin instructions    acetaminophen **OR** acetaminophen   Objective:   Vitals:   08/25/20 0602 08/25/20 0630 08/25/20 0700 08/25/20 0730  BP:  101/70 125/74 140/80  Pulse:  77 81   Resp:  12 14 11   Temp: 97.7 F (36.5 C)     TempSrc: Oral     SpO2:  99% 99%   Weight:      Height:        Intake/Output Summary (Last 24 hours) at 08/25/2020 0816 Last data filed at 08/25/2020 0150 Gross per 24 hour  Intake 1750.1 ml  Output 200 ml  Net 1550.1 ml   Filed Weights   08/24/20 1337  Weight: 50 kg     Examination:   Physical Exam  Constitution: Altered, withdraws to pain stimuli Psychiatric: Altered mental status, nonresponsive nonverbal at baseline advanced dementia HEENT: Normocephalic, PERRL, otherwise with in Normal limits  Chest:Chest symmetric Cardio vascular:  S1/S2, RRR, No murmure, No Rubs or Gallops  pulmonary: Clear to auscultation bilaterally, respirations unlabored, negative wheezes / crackles Abdomen: Soft, non-tender, non-distended, bowel sounds,no masses, no organomegaly Muscular skeletal: Limited exam -in bed withdraws to pain stimuli Neuro: Limited exam, withdraws to pain stimuli, altered mental status Extremities: No pitting edema lower extremities, +2 pulses  Skin: Dry, warm to touch, negative for any Rashes, No open wounds Wounds: per nursing documentation     ------------------------------------------------------------------------------------------------------------------------------------------    LABs:  CBC Latest Ref Rng & Units 08/25/2020 08/24/2020 06/18/2020  WBC 4.0 - 10.5 K/uL 13.3(H) 16.2(H) 10.9(H)  Hemoglobin 12.0 - 15.0 g/dL 12.7 16.8(H) 12.0  Hematocrit 36 - 46 % 38.9 53.4(H) 37.5  Platelets 150 - 400 K/uL 186 303 382   CMP Latest Ref Rng & Units 08/25/2020 08/24/2020 06/18/2020  Glucose 70 - 99 mg/dL 120(H) 137(H) 117(H)  BUN 8 - 23 mg/dL 62(H) 62(H) 25(H)  Creatinine 0.44 - 1.00 mg/dL 1.86(H) 2.08(H) 0.98  Sodium 135 - 145 mmol/L 147(H) 149(H) 140  Potassium 3.5 - 5.1 mmol/L 3.2(L) 4.3 3.7  Chloride 98 - 111 mmol/L 118(H) 120(H) 106  CO2 22 - 32 mmol/L 22 14(L) 26  Calcium 8.9 - 10.3 mg/dL 8.3(L) 9.6 8.4(L)  Total Protein 6.5 - 8.1 g/dL - 8.2(H) -  Total Bilirubin 0.3 - 1.2 mg/dL - 1.8(H) -  Alkaline Phos 38 - 126 U/L - 92 -  AST 15 - 41 U/L - 56(H) -  ALT 0 - 44 U/L - 28 -       Micro Results Recent Results (from the past 240 hour(s))  Culture, blood (routine x 2)     Status: None (Preliminary result)   Collection Time: 08/24/20  1:39 PM   Specimen: BLOOD  Result Value Ref Range Status   Specimen Description BLOOD LEFT HAND  Final   Special Requests   Final    BOTTLES DRAWN AEROBIC AND ANAEROBIC Blood Culture results may not be optimal due to an inadequate volume of blood received in culture bottles   Culture   Final    NO GROWTH < 24 HOURS Performed at [facility], [address] [place],  [address]    Report Status PENDING  Incomplete  Culture, blood (routine x 2)     Status: None (Preliminary result)   Collection Time: 08/24/20  1:39 PM   Specimen: BLOOD  Result Value Ref Range Status   Specimen Description BLOOD RIGHT FOREARM  Final   Special Requests   Final    BOTTLES DRAWN AEROBIC AND ANAEROBIC Blood Culture results may not be optimal due to an inadequate volume of blood received in  culture bottles   Culture   Final    NO GROWTH < 24  HOURS Performed at [facility], [address], [place], [place] [address]    Report Status PENDING  Incomplete  SARS Coronavirus 2 by RT PCR (hospital order, performed in [facility] hospital lab) Nasopharyngeal     Status: None   Collection Time: 08/24/20  6:54 PM   Specimen: Nasopharyngeal  Result Value Ref Range Status   SARS Coronavirus 2 NEGATIVE NEGATIVE Final    Comment: (NOTE) SARS-CoV-2 target nucleic acids are NOT DETECTED.  The SARS-CoV-2 RNA is generally detectable in upper and lower respiratory specimens during the acute phase of infection. The lowest concentration of SARS-CoV-2 viral copies this assay can detect is 250 copies / mL. A negative result does not preclude SARS-CoV-2 infection and should not be used as the sole basis for treatment or other patient management decisions.  A negative result may occur with improper specimen collection / handling, submission of specimen other than nasopharyngeal swab, presence of viral mutation(s) within the areas targeted by this assay, and inadequate number of viral copies (<250 copies / mL). A negative result must be combined with clinical observations, patient history, and epidemiological information.  Fact Sheet for Patients:   StrictlyIdeas.no  Fact Sheet for Healthcare Providers: BankingDealers.co.za  This test is not yet approved or  cleared by the [place] FDA and has been authorized for detection and/or diagnosis of SARS-CoV-2 by FDA under an Emergency Use Authorization (EUA).  This EUA will remain in effect (meaning this test can be used) for the duration of the COVID-19 declaration under Section 564(b)(1) of the Act, 21 U.S.C. section 360bbb-3(b)(1), unless the authorization is terminated or revoked sooner.  Performed at [facility], [address] [place], [place] [address]      Radiology Reports CT Abdomen Pelvis Wo Contrast  Result Date: 08/24/2020 CLINICAL DATA:  Diffuse abdominal pain EXAM: CT ABDOMEN AND PELVIS WITHOUT CONTRAST TECHNIQUE: Multidetector CT imaging of the abdomen and pelvis was performed following the standard protocol without IV contrast. COMPARISON:  06/23/2018 FINDINGS: Lower chest: The visualized lung bases are clear bilaterally. Mild calcification of the mitral valve annulus. Cardiac size within normal limits. Tiny hiatal hernia. Hepatobiliary: Cholelithiasis without superimposed pericholecystic inflammatory change. Heterogeneity of the liver parenchyma likely relates to beam hardening from adjacent osseous structures. The liver is otherwise unremarkable. No intra or extrahepatic biliary ductal dilation. Pancreas: Unremarkable Spleen: Unremarkable Adrenals/Urinary Tract: Adrenal glands are unremarkable. Kidneys are normal. Bladder is unremarkable. Stomach/Bowel: Stomach, small bowel, and large bowel are unremarkable save for mild sigmoid diverticulosis. Appendix normal. No free intraperitoneal gas or fluid. Vascular/Lymphatic: The abdominal vasculature is age-appropriate with moderate aortoiliac atherosclerotic calcification. No aneurysm. No pathologic adenopathy within the abdomen and pelvis. Reproductive: Uterus and bilateral adnexa are unremarkable. Other: The rectum is unremarkable. Musculoskeletal: Degenerative changes are seen within the lumbar spine. No acute bone abnormality. IMPRESSION: 1. No definite radiographic explanation for the patient's reported symptoms. 2. Cholelithiasis without superimposed pericholecystic inflammatory change. 3. Mild sigmoid diverticulosis. Aortic Atherosclerosis (ICD10-I70.0). Electronically Signed   By: Fidela Salisbury MD   On: 08/24/2020 16:48   CT Head Wo Contrast  Result Date: 08/24/2020 CLINICAL DATA:  Increasing lethargy, mental status change. EXAM: CT HEAD WITHOUT CONTRAST TECHNIQUE: Contiguous axial images were  obtained from the base of the skull through the vertex without intravenous contrast. COMPARISON:  Head CT dated 06/18/2020 FINDINGS: Brain: Generalized age related parenchymal volume loss with commensurate dilatation of the ventricles and sulci, stable. Chronic small vessel ischemic changes again noted within the bilateral periventricular  and subcortical white matter regions, stable. 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 There is no mass, hemorrhage, edema or other evidence of acute parenchymal abnormality. No extra-axial hemorrhage. Vascular: No hyperdense vessel or unexpected calcification. Skull: Normal. Negative for fracture or focal lesion. Sinuses/Orbits: No acute finding. Other: None. IMPRESSION: 1. No acute findings. No intracranial mass, hemorrhage or edema. 2. Chronic small vessel ischemic changes in the white matter and RIGHT basal ganglia. Electronically Signed   By: Franki Cabot M.D.   On: 08/24/2020 14:47   DG Chest Portable 1 View  Result Date: 08/24/2020 CLINICAL DATA:  Altered mental status EXAM: PORTABLE CHEST 1 VIEW COMPARISON:  Chest x-rays dated 02/07/2019 and 01/08/2019. FINDINGS: Heart size and mediastinal contours are within normal limits. Lungs are clear. No pleural effusion. Osseous structures about the chest are unremarkable. IMPRESSION: No active disease. No evidence of pneumonia or pulmonary edema. Electronically Signed   By: Franki Cabot M.D.   On: 08/24/2020 14:27    SIGNED: Deatra James, MD, FACP, FHM. [facility],  Pager (please use [email or web address] to page/text)  If 7PM-7AM, please contact night-coverage [email or web address], Password [facility] 08/25/2020, 8:16 AM

## 2020-08-25 NOTE — ED Notes (Signed)
Lab notified of need for venipuncture assist by rebecca, rn after three staff members have unsuccessfully attempted venipuncture for 0500 labs.

## 2020-08-25 NOTE — ED Notes (Signed)
Pt sleeping, pt is dry currently, hips shifted to left. Pt with blistering beginning on left fifth finger at base. Appears like tape burn, tape removed.

## 2020-08-26 LAB — CBC WITH DIFFERENTIAL/PLATELET
Abs Immature Granulocytes: 0.07 10*3/uL (ref 0.00–0.07)
Basophils Absolute: 0.1 10*3/uL (ref 0.0–0.1)
Basophils Relative: 0 %
Eosinophils Absolute: 0.4 10*3/uL (ref 0.0–0.5)
Eosinophils Relative: 3 %
HCT: 37.9 % (ref 36.0–46.0)
Hemoglobin: 13 g/dL (ref 12.0–15.0)
Immature Granulocytes: 1 %
Lymphocytes Relative: 11 %
Lymphs Abs: 1.3 10*3/uL (ref 0.7–4.0)
MCH: 30 pg (ref 26.0–34.0)
MCHC: 34.3 g/dL (ref 30.0–36.0)
MCV: 87.5 fL (ref 80.0–100.0)
Monocytes Absolute: 0.8 10*3/uL (ref 0.1–1.0)
Monocytes Relative: 6 %
Neutro Abs: 9.3 10*3/uL — ABNORMAL HIGH (ref 1.7–7.7)
Neutrophils Relative %: 79 %
Platelets: 175 10*3/uL (ref 150–400)
RBC: 4.33 MIL/uL (ref 3.87–5.11)
RDW: 13.8 % (ref 11.5–15.5)
WBC: 11.9 10*3/uL — ABNORMAL HIGH (ref 4.0–10.5)
nRBC: 0 % (ref 0.0–0.2)

## 2020-08-26 LAB — COMPREHENSIVE METABOLIC PANEL
ALT: 36 U/L (ref 0–44)
AST: 46 U/L — ABNORMAL HIGH (ref 15–41)
Albumin: 2.9 g/dL — ABNORMAL LOW (ref 3.5–5.0)
Alkaline Phosphatase: 60 U/L (ref 38–126)
Anion gap: 9 (ref 5–15)
BUN: 45 mg/dL — ABNORMAL HIGH (ref 8–23)
CO2: 21 mmol/L — ABNORMAL LOW (ref 22–32)
Calcium: 8 mg/dL — ABNORMAL LOW (ref 8.9–10.3)
Chloride: 111 mmol/L (ref 98–111)
Creatinine, Ser: 1.41 mg/dL — ABNORMAL HIGH (ref 0.44–1.00)
GFR calc Af Amer: 41 mL/min — ABNORMAL LOW (ref >60)
GFR calc non Af Amer: 35 mL/min — ABNORMAL LOW (ref >60)
Glucose, Bld: 105 mg/dL — ABNORMAL HIGH (ref 70–99)
Potassium: 2.7 mmol/L — CL (ref 3.5–5.1)
Sodium: 141 mmol/L (ref 135–145)
Total Bilirubin: 0.9 mg/dL (ref 0.3–1.2)
Total Protein: 5.7 g/dL — ABNORMAL LOW (ref 6.5–8.1)

## 2020-08-26 LAB — URINE CULTURE: Culture: <10000 — AB

## 2020-08-26 LAB — MAGNESIUM: Magnesium: 1.8 mg/dL (ref 1.7–2.4)

## 2020-08-26 MED ORDER — POTASSIUM CHLORIDE 10 MEQ/100ML IV SOLN
10.0000 meq | INTRAVENOUS | Status: AC
  Administered 2020-08-26 (×4): 10 meq via INTRAVENOUS
  Filled 2020-08-26 (×4): qty 100

## 2020-08-26 MED ORDER — MAGNESIUM SULFATE 2 GM/50ML IV SOLN
2.0000 g | Freq: Once | INTRAVENOUS | Status: AC
  Administered 2020-08-26: 2 g via INTRAVENOUS
  Filled 2020-08-26: qty 50

## 2020-08-26 MED ORDER — POTASSIUM CL IN DEXTROSE 5% 20 MEQ/L IV SOLN
20.0000 meq | INTRAVENOUS | Status: DC
  Administered 2020-08-26 – 2020-08-29 (×5): 20 meq via INTRAVENOUS
  Filled 2020-08-26 (×12): qty 1000

## 2020-08-26 MED ORDER — ENSURE ENLIVE PO LIQD
237.0000 mL | Freq: Two times a day (BID) | ORAL | Status: DC
  Administered 2020-08-26 – 2020-08-27 (×2): 237 mL via ORAL

## 2020-08-26 NOTE — Progress Notes (Signed)
PROGRESS NOTE    Patient: Jennifer Klein                            PCP: [facility]                    DOB: Apr 06, 1941            DOA: 08/24/2020 QZR:[id number]             DOS: 08/26/2020, 12:01 PM   LOS: 2 days   Date of Service: The patient was seen and examined on 08/26/2020  Subjective:   The patient was seen and examined this morning, comfortable laying in bed, eyes closed, withdraws to pain, does not follow any command.... Hemodynamically stable, No issues overnight  Brief Narrative:   Per HPI:   Jennifer Klein is a 79 y.o. F with hx advanced Dementia, lives in memory care, no longer verbal, A-fib on Xarelto and HTN who presents with few days progressive sluggishness, now somnolent and blue.  History was collected from triage as the patient is unable to provide independent history due to dementia and altered mental status.  Evidently over the last several days, the patient is been weak, mostly confined to bed. On admission day she was unable to get out of bed, sluggish and lethargic, cold and her extremities were blue in color.   EMS placed her on a nonrebreather, and brought her to the emergency room.  ER:  Sodium 149, creatinine 2.08 (from baseline 1.0), bicarb 14 with normal anion gap, WBC 16.2K.  Temp 90.19F core temp, heart rate 133 in A. fib, blood pressure normal.  Lactate 8.9.  Troponin detectable.   CT head unremarkable.  Chest x-ray clear, CT abdomen without contrast unremarkable.   ECG showed rapid A. fib with right bundle branch block.  Patient was started on IV fluids and empiric antibiotics and the hospitalist service were asked to evaluate  Assessment & Plan:   Principal Problem:   Septic shock ([place]) Active Problems:   Hypertension   Paroxysmal atrial fibrillation (HCC)   Alzheimer's dementia without behavioral disturbance (HCC)   CKD (chronic kidney disease) stage 3, GFR 30-59 ml/min   PAD (peripheral artery disease) (HCC)   Altered  mental status    Septic shock, sepsis-presumed -Much improved sepsis, septic etiology (improved tachycardia, tachypnea,) -Hemodynamically stable this a.m. temp 97.8, respirate 18, blood pressure 105/88 ???  Baseline mental status, still not responding to verbal command,-apparently nonverbal at baseline with severe dementia -Status post aggressive IV fluid resuscitation -Lactic acid 8.9 >>> 2.4  -Source of infection likely urinary -Urine culture less than 10,000 colonies of insignificant growth, blood cultures negative to date We will reculture urine. -We will continue with the broad-spectrum antibiotic started vancomycin, cefepime, Flagyl for today We will discontinue vancomycin today -We will follow up with blood/urine cultures accordingly   Hypokalemia/hypernatremia - Dehydration -Start hypotonic fluids, D5W is to be continued along with added potassium 20 mEq -Sodium 149-147  Refractory hypokalemia Repleting with 40 mEq IV, along with 20 mEq D5W Repleting magnesium  Acute renal failure -due to sepsis/septic shock -dehydration-hypovolemia -Creatinine continues to improve, 1.86, 1.41 today -Continue with IV fluid resuscitation -Monitoring BUN/creatinine  Monitoring I's and O's Avoid nephrotoxins  Acute metabolic encephalopathy superimposed on advanced dementia ?  Possibly back to baseline, nonverbal at baseline, and comfortably, withdraws to pain stimuli Due to dehydration/hypernatremia or septic shock, imposed on advanced dementia Remains  lethargic, unresponsive, withdraws to pain stimuli, nonverbal  Paroxysmal atrial fibrillation with RVR Remained stable Given diltaizem x1 in ER with some improvement.  Will give repeat dose, this time metoprolol IV 5 mg once and then resume home metoprolol at increased dose. -Continue metoprolol -Continue Xarelto    Dementia / Anxiety -Hold Buspar, Ativan, memantine until more alert No signs of agitation  Goals of Care   Pending palliative care follow-up Spoke with husband and son-in-law. Confirmed DNR, and aggressive invasive measures like central lines and pressors or dialysis would not be congruent with her goals of care.  We are recommending hospice, and family will discuss this.   -[place] -If hypotonic fluids and correction of eletrolytes and renal function does not return the patient towards her previous level of function, I would recommend withdrawal of IV fluids after a 24 hours trial and referral to Hospice; husband is in [place] but not local and would need some time to return to the bedside      Nutritional status:            Cultures; Blood Cultures x 2 >> Urine Culture  >>>    Antimicrobials:  Cefepime 2g IV Vancomycin 1000mg  IV Metronidazole 500mg  IV   Consultants: Palliative care   ---------------------------------------------------------------------------------------------------------------------------------------------   DVT prophylaxis: Xarelto  Code Status: DNR  Family Communication:  No family present at bedside, will attempt to reach her husband  Disposition Plan: Anticipate IV fluids for 24 hours, palliative care consultation and monitoring clinical status.  In the setting of her advanced dementia, if the patient does not appear to be progressing back to her previous level of function, we are recommending [facility] referral Consults called: Palliative     Admission status:    Status is: Inpatient  Remains inpatient appropriate because:Inpatient level of care appropriate due to severity of illness   Dispo: The patient is from: Memory unit              Anticipated d/c is to: SNF -memory unit with palliative care-versus hospice-hospice home              Anticipated d/c date is: 3 days              Patient currently is not medically stable to d/c.        Procedures:   No admission procedures for hospital  encounter.     Antimicrobials:  Anti-infectives (From admission, onward)   Start     Dose/Rate Route Frequency Ordered Stop   08/26/20 0600  vancomycin (VANCOREADY) IVPB 750 mg/150 mL        750 mg 150 mL/hr over 60 Minutes Intravenous Every 48 hours 08/25/20 1336     08/25/20 1500  ceFEPIme (MAXIPIME) 2 g in sodium chloride 0.9 % 100 mL IVPB        2 g 200 mL/hr over 30 Minutes Intravenous Every 24 hours 08/24/20 1808     08/25/20 0000  metroNIDAZOLE (FLAGYL) IVPB 500 mg        500 mg 100 mL/hr over 60 Minutes Intravenous Every 8 hours 08/24/20 1743     08/24/20 1808  vancomycin variable dose per unstable renal function (pharmacist dosing)  Status:  Discontinued         Does not apply See admin instructions 08/24/20 1808 08/25/20 1336   08/24/20 1445  ceFEPIme (MAXIPIME) 2 g in sodium chloride 0.9 % 100 mL IVPB        2 g 200 mL/hr  over 30 Minutes Intravenous  Once 08/24/20 1436 08/24/20 1546   08/24/20 1445  metroNIDAZOLE (FLAGYL) IVPB 500 mg        500 mg 100 mL/hr over 60 Minutes Intravenous  Once 08/24/20 1436 08/24/20 1646   08/24/20 1445  vancomycin (VANCOCIN) IVPB 1000 mg/200 mL premix        1,000 mg 200 mL/hr over 60 Minutes Intravenous  Once 08/24/20 1436 08/24/20 1800       Medication:  . Rivaroxaban  15 mg Oral Q supper    acetaminophen **OR** acetaminophen   Objective:   Vitals:   08/26/20 0441 08/26/20 0447 08/26/20 0813 08/26/20 1155  BP:  (!) 130/95 (!) 114/94 105/88  Pulse:  98 75 76  Resp:  20 18 18   Temp:  97.7 F (36.5 C) 97.7 F (36.5 C) 97.8 F (36.6 C)  TempSrc:  Oral Oral Oral  SpO2:  90% 100% 99%  Weight: 51.1 kg     Height:        Intake/Output Summary (Last 24 hours) at 08/26/2020 1201 Last data filed at 08/26/2020 0448 Gross per 24 hour  Intake 200 ml  Output 300 ml  Net -100 ml   Filed Weights   08/24/20 1337 08/25/20 1853 08/26/20 0441  Weight: 50 kg 50.9 kg 51.1 kg     Examination:      Physical Exam:   General:    Altered response to pain stimuli only -nonverbal at baseline  HEENT:  Normocephalic, PERRL, otherwise with in Normal limits   Neuro:   Encephalopathic-limited exam -sensation intact  Lungs:   Clear to auscultation BL, Respirations unlabored, no wheezes / crackles  Cardio:    S1/S2, RRR, No murmure, No Rubs or Gallops   Abdomen:   Soft, non-tender, bowel sounds active all four quadrants,  no guarding or peritoneal signs.  Muscular skeletal:  Limited exam -severe generalized weaknesses withdraws extremities to pain stimuli   2+ pulses,  symmetric, No pitting edema  Skin:  Dry, warm to touch, negative for any Rashes, No open wounds  Wounds: Please see nursing documentation         ------------------------------------------------------------------------------------------------------------------------------------------    LABs:  CBC Latest Ref Rng & Units 08/26/2020 08/25/2020 08/24/2020  WBC 4.0 - 10.5 K/uL 11.9(H) 13.3(H) 16.2(H)  Hemoglobin 12.0 - 15.0 g/dL 13.0 12.7 16.8(H)  Hematocrit 36 - 46 % 37.9 38.9 53.4(H)  Platelets 150 - 400 K/uL 175 186 303   CMP Latest Ref Rng & Units 08/26/2020 08/25/2020 08/24/2020  Glucose 70 - 99 mg/dL 105(H) 120(H) 137(H)  BUN 8 - 23 mg/dL 45(H) 62(H) 62(H)  Creatinine 0.44 - 1.00 mg/dL 1.41(H) 1.86(H) 2.08(H)  Sodium 135 - 145 mmol/L 141 147(H) 149(H)  Potassium 3.5 - 5.1 mmol/L 2.7(LL) 3.2(L) 4.3  Chloride 98 - 111 mmol/L 111 118(H) 120(H)  CO2 22 - 32 mmol/L 21(L) 22 14(L)  Calcium 8.9 - 10.3 mg/dL 8.0(L) 8.3(L) 9.6  Total Protein 6.5 - 8.1 g/dL 5.7(L) - 8.2(H)  Total Bilirubin 0.3 - 1.2 mg/dL 0.9 - 1.8(H)  Alkaline Phos 38 - 126 U/L 60 - 92  AST 15 - 41 U/L 46(H) - 56(H)  ALT 0 - 44 U/L 36 - 28       Micro Results Recent Results (from the past 240 hour(s))  Culture, blood (routine x 2)     Status: None (Preliminary result)   Collection Time: 08/24/20  1:39 PM   Specimen: BLOOD  Result Value Ref Range Status   Specimen  Description BLOOD LEFT  HAND  Final   Special Requests   Final    BOTTLES DRAWN AEROBIC AND ANAEROBIC Blood Culture results may not be optimal due to an inadequate volume of blood received in culture bottles   Culture   Final    NO GROWTH 2 DAYS Performed at [facility], [address], [place], [place] [address]    Report Status PENDING  Incomplete  Culture, blood (routine x 2)     Status: None (Preliminary result)   Collection Time: 08/24/20  1:39 PM   Specimen: BLOOD  Result Value Ref Range Status   Specimen Description BLOOD RIGHT FOREARM  Final   Special Requests   Final    BOTTLES DRAWN AEROBIC AND ANAEROBIC Blood Culture results may not be optimal due to an inadequate volume of blood received in culture bottles   Culture   Final    NO GROWTH 2 DAYS Performed at [facility], [address] [place], [place] [address]    Report Status PENDING  Incomplete  SARS Coronavirus 2 by RT PCR (hospital order, performed in [place] hospital lab) Nasopharyngeal     Status: None   Collection Time: 08/24/20  6:54 PM   Specimen: Nasopharyngeal  Result Value Ref Range Status   SARS Coronavirus 2 NEGATIVE NEGATIVE Final    Comment: (NOTE) SARS-CoV-2 target nucleic acids are NOT DETECTED.  The SARS-CoV-2 RNA is generally detectable in upper and lower respiratory specimens during the acute phase of infection. The lowest concentration of SARS-CoV-2 viral copies this assay can detect is 250 copies / mL. A negative result does not preclude SARS-CoV-2 infection and should not be used as the sole basis for treatment or other patient management decisions.  A negative result may occur with improper specimen collection / handling, submission of specimen other than nasopharyngeal swab, presence of viral mutation(s) within the areas targeted by this assay, and inadequate number of viral copies (<250 copies / mL). A negative result must be combined with clinical observations, patient history, and  epidemiological information.  Fact Sheet for Patients:   StrictlyIdeas.no  Fact Sheet for Healthcare Providers: BankingDealers.co.za  This test is not yet approved or  cleared by the [place] FDA and has been authorized for detection and/or diagnosis of SARS-CoV-2 by FDA under an Emergency Use Authorization (EUA).  This EUA will remain in effect (meaning this test can be used) for the duration of the COVID-19 declaration under Section 564(b)(1) of the Act, 21 U.S.C. section 360bbb-3(b)(1), unless the authorization is terminated or revoked sooner.  Performed at [facility], [address] [place], [place] [address]   Urine Culture     Status: Abnormal   Collection Time: 08/24/20  6:54 PM   Specimen: Urine, Random  Result Value Ref Range Status   Specimen Description   Final    URINE, RANDOM Performed at [facility], [address], [place], [place] [address]    Special Requests   Final    NONE Performed at [facility], [address] [place], [place] [address]    Culture (A)  Final    <10,000 COLONIES/mL INSIGNIFICANT GROWTH Performed at [facility], [place] [address], [place], [place] [address]    Report Status 08/26/2020 FINAL  Final    Radiology Reports CT Abdomen Pelvis Wo Contrast  Result Date: 08/24/2020 CLINICAL DATA:  Diffuse abdominal pain EXAM: CT ABDOMEN AND PELVIS WITHOUT CONTRAST TECHNIQUE: Multidetector CT imaging of the abdomen  and pelvis was performed following the standard protocol without IV contrast. COMPARISON:  06/23/2018 FINDINGS: Lower chest: The visualized lung bases are clear bilaterally. Mild calcification of the mitral valve annulus. Cardiac size within normal limits. Tiny hiatal hernia. Hepatobiliary: Cholelithiasis without superimposed pericholecystic inflammatory change. Heterogeneity of the liver parenchyma likely relates to beam hardening from adjacent  osseous structures. The liver is otherwise unremarkable. No intra or extrahepatic biliary ductal dilation. Pancreas: Unremarkable Spleen: Unremarkable Adrenals/Urinary Tract: Adrenal glands are unremarkable. Kidneys are normal. Bladder is unremarkable. Stomach/Bowel: Stomach, small bowel, and large bowel are unremarkable save for mild sigmoid diverticulosis. Appendix normal. No free intraperitoneal gas or fluid. Vascular/Lymphatic: The abdominal vasculature is age-appropriate with moderate aortoiliac atherosclerotic calcification. No aneurysm. No pathologic adenopathy within the abdomen and pelvis. Reproductive: Uterus and bilateral adnexa are unremarkable. Other: The rectum is unremarkable. Musculoskeletal: Degenerative changes are seen within the lumbar spine. No acute bone abnormality. IMPRESSION: 1. No definite radiographic explanation for the patient's reported symptoms. 2. Cholelithiasis without superimposed pericholecystic inflammatory change. 3. Mild sigmoid diverticulosis. Aortic Atherosclerosis (ICD10-I70.0). Electronically Signed   By: Fidela Salisbury MD   On: 08/24/2020 16:48   CT Head Wo Contrast  Result Date: 08/24/2020 CLINICAL DATA:  Increasing lethargy, mental status change. EXAM: CT HEAD WITHOUT CONTRAST TECHNIQUE: Contiguous axial images were obtained from the base of the skull through the vertex without intravenous contrast. COMPARISON:  Head CT dated 06/18/2020 FINDINGS: Brain: Generalized age related parenchymal volume loss with commensurate dilatation of the ventricles and sulci, stable. Chronic small vessel ischemic changes again noted within the bilateral periventricular and subcortical white matter regions, stable. 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 There is no mass, hemorrhage, edema or other evidence of acute parenchymal abnormality. No extra-axial hemorrhage. Vascular: No hyperdense vessel or unexpected calcification. Skull: Normal. Negative  for fracture or focal lesion. Sinuses/Orbits: No acute finding. Other: None. IMPRESSION: 1. No acute findings. No intracranial mass, hemorrhage or edema. 2. Chronic small vessel ischemic changes in the white matter and RIGHT basal ganglia. Electronically Signed   By: Franki Cabot M.D.   On: 08/24/2020 14:47   DG Chest Portable 1 View  Result Date: 08/24/2020 CLINICAL DATA:  Altered mental status EXAM: PORTABLE CHEST 1 VIEW COMPARISON:  Chest x-rays dated 02/07/2019 and 01/08/2019. FINDINGS: Heart size and mediastinal contours are within normal limits. Lungs are clear. No pleural effusion. Osseous structures about the chest are unremarkable. IMPRESSION: No active disease. No evidence of pneumonia or pulmonary edema. Electronically Signed   By: Franki Cabot M.D.   On: 08/24/2020 14:27    SIGNED: Deatra James, MD, FACP, FHM. [facility],  Pager (please use [email or web address] to page/text)  If 7PM-7AM, please contact night-coverage [email or web address], Password [facility] 08/26/2020, 12:01 PM

## 2020-08-26 NOTE — Progress Notes (Signed)
Initial Nutrition Assessment  DOCUMENTATION CODES:   Non-severe (moderate) malnutrition in context of chronic illness  INTERVENTION:   Ensure Enlive po BID, each supplement provides 350 kcal and 20 grams of protein  Magic cup TID with meals, each supplement provides 290 kcal and 9 grams of protein  Dysphagia 3 diet   Pt at high refeed risk; recommend monitor K, Mg and P labs daily until stable  NUTRITION DIAGNOSIS:   Moderate Malnutrition related to chronic illness (advanced dementia) as evidenced by moderate fat depletion, moderate muscle depletion, 23 percent weight loss.  GOAL:   Patient will meet greater than or equal to 90% of their needs  MONITOR:   PO intake, Supplement acceptance, Labs, Weight trends, Skin, I & O's  REASON FOR ASSESSMENT:   Malnutrition Screening Tool    ASSESSMENT:   79 y.o. F with hx advanced Dementia, lives in memory care, no longer verbal, A-fib on Xarelto and HTN who presents with septic shock.   Met with pt in room today. Unable to obtain any nutrition related history as pt with advanced dementia and is non-verbal. Pt's lunch tray was sitting on her side table untouched. NT reports that she was able to get patient to eat only a few bites of oatmeal for breakfast this morning. Per chart, pt is down 32lbs(23%) from her UBW; RD unsure how recently weight loss occurred. RD suspects poor appetite and oral intake at baseline r/t dementia. RD will add supplements to help pt meet her estimated needs. RD will also change pt to a mechanical soft diet. Pt is at high refeed risk. Palliative care consult pending.   Medications reviewed and include: cefepime, 5% dextrose w/ KCl _0 /hr, metronidazole, vancomycin   Labs reviewed: K 2.7(L), BUN 45(H), creat 1.41(H), Mg 1.8 wnl Wbc- 11.9(H)  NUTRITION - FOCUSED PHYSICAL EXAM:    Most Recent Value  Orbital Region Moderate depletion  Upper Arm Region Moderate depletion  Thoracic and Lumbar Region  Moderate depletion  Buccal Region Moderate depletion  Temple Region Moderate depletion  Clavicle Bone Region Moderate depletion  Clavicle and Acromion Bone Region Moderate depletion  Scapular Bone Region Moderate depletion  Dorsal Hand Severe depletion  Patellar Region Severe depletion  Anterior Thigh Region Severe depletion  Posterior Calf Region Severe depletion  Edema (RD Assessment) None  Hair Reviewed  Eyes Reviewed  Mouth Reviewed  Skin Reviewed  Nails Reviewed     Diet Order:   Diet Order            DIET DYS 3 Room service appropriate? No; Fluid consistency: Thin  Diet effective now                EDUCATION NEEDS:   No education needs have been identified at this time  Skin:  Skin Assessment: Reviewed RN Assessment  Last BM:  pta  Height:   Ht Readings from Last 1 Encounters:  08/25/20 _1  (1.626 m)    Weight:   Wt Readings from Last 1 Encounters:  08/26/20 51.1 kg    Ideal Body Weight:  54.5 kg  BMI:  Body mass index is 19.34 kg/m.  Estimated Nutritional Needs:   Kcal:  1300-1500kcal/day  Protein:  65-75g/day  Fluid:  >1.3L/day  Koleen Distance MS, RD, LDN Please refer to [facility] for RD and/or RD on-call/weekend/after hours pager

## 2020-08-27 ENCOUNTER — Encounter: Payer: Self-pay | Admitting: Family Medicine

## 2020-08-27 DIAGNOSIS — E440 Moderate protein-calorie malnutrition: Secondary | ICD-10-CM | POA: Insufficient documentation

## 2020-08-27 DIAGNOSIS — F0280 Dementia in other diseases classified elsewhere without behavioral disturbance: Secondary | ICD-10-CM

## 2020-08-27 DIAGNOSIS — G309 Alzheimer's disease, unspecified: Secondary | ICD-10-CM

## 2020-08-27 DIAGNOSIS — Z7189 Other specified counseling: Secondary | ICD-10-CM

## 2020-08-27 DIAGNOSIS — Z515 Encounter for palliative care: Secondary | ICD-10-CM

## 2020-08-27 LAB — CBC WITH DIFFERENTIAL/PLATELET
Abs Immature Granulocytes: 0.09 10*3/uL — ABNORMAL HIGH (ref 0.00–0.07)
Basophils Absolute: 0.1 10*3/uL (ref 0.0–0.1)
Basophils Relative: 1 %
Eosinophils Absolute: 0.3 10*3/uL (ref 0.0–0.5)
Eosinophils Relative: 4 %
HCT: 35.5 % — ABNORMAL LOW (ref 36.0–46.0)
Hemoglobin: 12.2 g/dL (ref 12.0–15.0)
Immature Granulocytes: 1 %
Lymphocytes Relative: 13 %
Lymphs Abs: 1.2 10*3/uL (ref 0.7–4.0)
MCH: 30.4 pg (ref 26.0–34.0)
MCHC: 34.4 g/dL (ref 30.0–36.0)
MCV: 88.5 fL (ref 80.0–100.0)
Monocytes Absolute: 0.7 10*3/uL (ref 0.1–1.0)
Monocytes Relative: 7 %
Neutro Abs: 7.2 10*3/uL (ref 1.7–7.7)
Neutrophils Relative %: 74 %
Platelets: 163 10*3/uL (ref 150–400)
RBC: 4.01 MIL/uL (ref 3.87–5.11)
RDW: 13.6 % (ref 11.5–15.5)
WBC: 9.5 10*3/uL (ref 4.0–10.5)
nRBC: 0 % (ref 0.0–0.2)

## 2020-08-27 LAB — COMPREHENSIVE METABOLIC PANEL
ALT: 30 U/L (ref 0–44)
AST: 31 U/L (ref 15–41)
Albumin: 2.5 g/dL — ABNORMAL LOW (ref 3.5–5.0)
Alkaline Phosphatase: 57 U/L (ref 38–126)
Anion gap: 8 (ref 5–15)
BUN: 30 mg/dL — ABNORMAL HIGH (ref 8–23)
CO2: 20 mmol/L — ABNORMAL LOW (ref 22–32)
Calcium: 7.8 mg/dL — ABNORMAL LOW (ref 8.9–10.3)
Chloride: 110 mmol/L (ref 98–111)
Creatinine, Ser: 1.13 mg/dL — ABNORMAL HIGH (ref 0.44–1.00)
GFR calc Af Amer: 53 mL/min — ABNORMAL LOW (ref >60)
GFR calc non Af Amer: 46 mL/min — ABNORMAL LOW (ref >60)
Glucose, Bld: 123 mg/dL — ABNORMAL HIGH (ref 70–99)
Potassium: 3.3 mmol/L — ABNORMAL LOW (ref 3.5–5.1)
Sodium: 138 mmol/L (ref 135–145)
Total Bilirubin: 0.8 mg/dL (ref 0.3–1.2)
Total Protein: 5 g/dL — ABNORMAL LOW (ref 6.5–8.1)

## 2020-08-27 LAB — PHOSPHORUS: Phosphorus: 2.2 mg/dL — ABNORMAL LOW (ref 2.5–4.6)

## 2020-08-27 MED ORDER — POTASSIUM PHOSPHATES 15 MMOLE/5ML IV SOLN
15.0000 mmol | Freq: Once | INTRAVENOUS | Status: AC
  Administered 2020-08-27: 15 mmol via INTRAVENOUS
  Filled 2020-08-27: qty 5

## 2020-08-27 MED ORDER — SODIUM CHLORIDE 0.9 % IV SOLN
2.0000 g | Freq: Two times a day (BID) | INTRAVENOUS | Status: DC
  Administered 2020-08-27 – 2020-08-29 (×5): 2 g via INTRAVENOUS
  Filled 2020-08-27 (×7): qty 2

## 2020-08-27 NOTE — Care Management Important Message (Signed)
Important Message  Patient Details  Name: Jennifer Klein MRN: [id number] Date of Birth: 1941-06-16   Medicare Important Message Given:  Yes     Dannette Barbara 08/27/2020, 12:01 PM

## 2020-08-27 NOTE — Consult Note (Signed)
Pharmacy Antibiotic Note  Jennifer Klein is a 79 y.o. female with medical history including dementia, atrial fibrillation on rivaroxaban, hypertension, and hyperlipidemia admitted on 08/24/2020 with sepsis.  Pharmacy was consulted for cefepime and vancomycin dosing. Patient is also ordered metronidazole. Since admission her renal function has improved, although not yet back to her baseline level. Vancomycin discontinued today (9/7). Lactic acid was 8.9 on admission and is wnl now.   Day 4 IV antibiotics, WBC were elevated on admission and are now wnl, afebrile, Scr 1.41>1.13  Plan: Change cefepime 2 grams IV every 24 hours to cefepime 2 grams IV every 12 hours   Height: 5\' 4"  (162.6 cm) Weight: 51.8 kg (114 lb 1.6 oz) IBW/kg (Calculated) : 54.7  Temp (24hrs), Avg:97.7 F (36.5 C), Min:97.6 F (36.4 C), Max:97.9 F (36.6 C)  Recent Labs  Lab 08/24/20 1339 08/24/20 1615 08/24/20 1854 08/24/20 2206 08/25/20 0415 08/25/20 1643 08/26/20 0517 08/27/20 0455  WBC 16.2*  --   --   --  13.3*  --  11.9* 9.5  CREATININE 2.08*  --   --   --  1.86*  --  1.41* 1.13*  LATICACIDVEN 8.9* 6.2* 3.7* 2.4*  --  1.3  --   --     Estimated Creatinine Clearance: 32.5 mL/min (A) (by C-G formula based on SCr of 1.13 mg/dL (H)).    Antimicrobials this admission: Cefepime 9/4 >>  Metronidazole 9/4 >>  Vancomycin 9/4 >> 9/7  Dose adjustments this admission: 9/7 Cefepime 2g IV q24h to Cefepime 2g IV q12h   Microbiology results: 9/4 BCx: NGTD 9/4 UCx: <10,000 colonies/ml (insignificant growth) 9/4 SARS-CoV-2: negative  Thank you for allowing pharmacy to be a part of this patient's care.  Sherilyn Banker, PharmD Pharmacy Resident  08/27/2020 12:46 PM

## 2020-08-27 NOTE — Consult Note (Signed)
Consultation Note Date: 08/27/2020   Patient Name: Jennifer Klein  DOB: 1941/02/12  MRN: [id number]  Age / Sex: 79 y.o., female  PCP: [facility] Referring Physician: Deatra James, MD  Reason for Consultation: Establishing goals of care and Psychosocial/spiritual support   HPI/Patient Profile: 78 y.o. female  with past medical history of dementia, HTN/HLD, PAF on Xarelto, osteoporosis, gout, B12 deficiency, lives in a memory care unit, wanderer admitted on 08/24/2020 with septic shock presumed, dehydration, acute metabolic encephalopathy imposed on advanced dementia.   Clinical Assessment and Goals of Care: I have reviewed medical records including EPIC notes, labs and imaging, received report from bedside nursing staff.  Jennifer Klein is sitting up quietly in bed.  She will briefly make but not keep eye contact.  She has a flat affect and is subdued.  She is reported to be nonverbal, and does not respond in any way to my questions.  I do not believe she is able to make her basic needs known.  There is no family at bedside at this time.  She does eat one half of a Magic cup, without overt signs and symptoms of dysphagia/aspiration.     Call to son-in-law Jennifer Klein, left VM message.  Call to spouse Jennifer Klein, no answer, unable to leave VM message. Call to discuss diagnosis prognosis, [place], EOL wishes, disposition and options.  Conference with attending, bedside nursing staff, and [facility] team related to patient condition, needs, [place] and disposition.    HCPOA   NEXT OF KIN - husband Jennifer Klein    SUMMARY OF RECOMMENDATIONS   Continue to treat the treatable, but no CPR or intubation  Anticipate return to [facility], memory care  Would benefit from out patient palliative services.    Code Status/Advance Care Planning:  DNR  Symptom Management:   Per hospitalist, no additional needs  at this time.  Palliative Prophylaxis:   Frequent Pain Assessment, Oral Care and Turn Reposition  Additional Recommendations (Limitations, Scope, Preferences):  Treat the treatable but no CPR or intubation  Psycho-social/Spiritual:   Desire for further Chaplaincy support:no  Additional Recommendations: Caregiving  Support/Resources and Education on Hospice  Prognosis:   Unable to determine, based on outcomes.  6 to 12 months or less would not be surprising based on acute illness, chronic disease burden, frailty, advancing memory loss, resident of memory care unit  Discharge Planning: Anticipate return to the memory care unit with the benefit of outpatient palliative services.      Primary Diagnoses: Present on Admission: . Septic shock ([place]) . Alzheimer's dementia without behavioral disturbance ([place]) . CKD (chronic kidney disease) stage 3, GFR 30-59 ml/min . Hypertension . Paroxysmal atrial fibrillation ([facility]) . PAD (peripheral artery disease) ([place]) . Altered mental status   I have reviewed the medical record, interviewed the patient and family, and examined the patient. The following aspects are pertinent.  Past Medical History:  Diagnosis Date  . Alzheimer's dementia with behavioral disturbance ([place]) 05/04/2016  . Alzheimer's dementia without behavioral disturbance (  [place]) 05/04/2016  . B12 deficiency 05/06/2016  . Broken nose   . Gout   . Hemorrhoids   . Hyperlipidemia LDL goal <100 05/05/2016  . Hypertension   . Memory loss   . PAF (paroxysmal atrial fibrillation) ([place])    a. on Xarelto; b. 48-hr Holter 2014: NSR with PAF/flutter which happened at least 2.26% of the time w/ associated tachycardia. Patient was asymptomatic; c. CHADS2VASc => 4 (HTN, age x 2, female)  . Paroxysmal atrial fibrillation ([place]) 08/07/2013  . Senile osteoporosis   . Systolic dysfunction    a. echo 08/2013: EF 50-55%, mild MR, mildly dilated LA   Social History   Socioeconomic History  .  Marital status: Married    Spouse name: Not on file  . Number of children: Not on file  . Years of education: Not on file  . Highest education level: Not on file  Occupational History  . Not on file  Tobacco Use  . Smoking status: Former Smoker    Packs/day: 0.25    Years: 1.00    Pack years: 0.25    Types: Cigarettes  . Smokeless tobacco: Never Used  Vaping Use  . Vaping Use: Never used  Substance and Sexual Activity  . Alcohol use: No    Alcohol/week: 0.0 standard drinks  . Drug use: No  . Sexual activity: Not on file  Other Topics Concern  . Not on file  Social History Narrative   Lives at [facility].   Worked at [employer].       Social Determinants of Health   Financial Resource Strain:   . Difficulty of Paying Living Expenses: Not on file  Food Insecurity:   . Worried About Charity fundraiser in the Last Year: Not on file  . Ran Out of Food in the Last Year: Not on file  Transportation Needs:   . Lack of Transportation (Medical): Not on file  . Lack of Transportation (Non-Medical): Not on file  Physical Activity:   . Days of Exercise per Week: Not on file  . Minutes of Exercise per Session: Not on file  Stress:   . Feeling of Stress : Not on file  Social Connections:   . Frequency of Communication with Friends and Family: Not on file  . Frequency of Social Gatherings with Friends and Family: Not on file  . Attends Religious Services: Not on file  . Active Member of Clubs or Organizations: Not on file  . Attends Archivist Meetings: Not on file  . Marital Status: Not on file   Family History  Problem Relation Age of Onset  . Heart disease Father   . Gout Brother    Scheduled Meds: . feeding supplement (ENSURE ENLIVE)  237 mL Oral BID BM  . Rivaroxaban  15 mg Oral Q supper   Continuous Infusions: . ceFEPime (MAXIPIME) IV    . dextrose 5 % with KCl 20 mEq / L 100 mL/hr at 08/27/20 1011  . metronidazole Stopped  (08/27/20 0959)   PRN Meds:.acetaminophen **OR** acetaminophen Medications Prior to Admission:  Prior to Admission medications   Medication Sig Start Date End Date Taking? Authorizing Provider  acetaminophen (TYLENOL) 500 MG tablet Take 500 mg by mouth every 6 (six) hours as needed.   Yes [provider]  allopurinol (ZYLOPRIM) 100 MG tablet Take 1 tablet (100 mg total) by mouth daily. 10/27/18  Yes Lada, Satira Anis, MD  alum & mag hydroxide-simeth (MI-ACID)  200-200-20 MG/5ML suspension Take 30 mLs by mouth every 6 (six) hours as needed for indigestion or heartburn.   Yes [provider]  Amino Acids-Protein Hydrolys (FEEDING SUPPLEMENT, PRO-STAT SUGAR FREE 64,) LIQD Take 30 mLs by mouth daily.   Yes [provider]  atorvastatin (LIPITOR) 20 MG tablet Take 1 tablet (20 mg total) by mouth daily. 02/13/20  Yes Delsa Grana, PA-C  busPIRone (BUSPAR) 5 MG tablet TAKE 1 TABLET BY MOUTH 2 TIMES DAILY FORDEPRESSION Patient taking differently: Take 5 mg by mouth 2 (two) times daily.  05/30/18  Yes Lada, Satira Anis, MD  cholecalciferol (VITAMIN D) 1000 units tablet TAKE 1 TABLET BY MOUTH ONCE DAILY Patient taking differently: Take 1,000 Units by mouth daily.  04/13/18  Yes Lada, Satira Anis, MD  donepezil (ARICEPT) 10 MG tablet TAKE 1 TABLET BY MOUTH BEDTIME Patient taking differently: Take 10 mg by mouth at bedtime. TAKE 1 TABLET BY MOUTH BEDTIME 10/27/18  Yes Lada, Satira Anis, MD  GNP VITAMIN B-12 500 MCG tablet TAKE 1 TABLET BY MOUTH EVERY DAY 07/24/19  Yes Poulose, Bethel Born, NP  guaifenesin (ROBITUSSIN) 100 MG/5ML syrup Take 200 mg by mouth every 6 (six) hours as needed for cough.    Yes [provider]  loperamide (IMODIUM) 2 MG capsule Take 2 mg by mouth as needed for diarrhea or loose stools.   Yes [provider]  LORazepam (ATIVAN) 0.5 MG tablet Take 0.5 tablets (0.25 mg total) by mouth 2 (two) times daily. 05/24/19  Yes Poulose, Bethel Born, NP  magnesium  hydroxide (MILK OF MAGNESIA) 400 MG/5ML suspension Take 30 mLs by mouth daily as needed for mild constipation.   Yes [provider]  memantine (NAMENDA) 10 MG tablet Take 1 tablet (10 mg total) by mouth 2 (two) times daily. 10/27/18  Yes Lada, Satira Anis, MD  metoprolol tartrate (LOPRESSOR) 25 MG tablet TAKE 1 TABLET BY MOUTH 2 TIMES PER DAY 06/25/19  Yes Hubbard Hartshorn, FNP  neomycin-bacitracin-polymyxin (NEOSPORIN) [phone] ointment Apply topically 4 (four) times daily as needed.    Yes [provider]  pantoprazole (PROTONIX) 40 MG tablet Take 40 mg by mouth daily. 07/06/20  Yes [provider]  XARELTO 15 MG TABS tablet TAKE 1 TABLET BY MOUTH ONCE DAILY 08/10/19  Yes Poulose, Bethel Born, NP   Allergies  Allergen Reactions  . Darvon [Propoxyphene Hcl]     As a child   Review of Systems  Unable to perform ROS: Dementia    Physical Exam Vitals and nursing note reviewed.  Constitutional:      General: She is not in acute distress.    Appearance: She is normal weight. She is ill-appearing.  HENT:     Head: Normocephalic and atraumatic.     Mouth/Throat:     Mouth: Mucous membranes are moist.  Cardiovascular:     Rate and Rhythm: Normal rate.  Pulmonary:     Effort: Pulmonary effort is normal. No respiratory distress.  Abdominal:     General: Abdomen is flat.  Musculoskeletal:        General: No swelling.  Skin:    General: Skin is warm and dry.  Neurological:     Mental Status: She is alert.     Comments: reported to be nonverbal at baseline, does not respond to my questions  Psychiatric:     Comments: Calm, not fearful, flat affect     Vital Signs: BP 135/87 (BP Location: Right Arm)   Pulse 88  Temp 97.9 F (36.6 C)   Resp 18   Ht 5\' 4"  (1.626 m)   Wt 51.8 kg   SpO2 99%   BMI 19.59 kg/m  Pain Scale: 0-10   Pain Score: 0-No pain   SpO2: SpO2: 99 % O2 Device:SpO2: 99 % O2 Flow Rate: .   IO: Intake/output summary:   Intake/Output  Summary (Last 24 hours) at 08/27/2020 1154 Last data filed at 08/27/2020 1011 Gross per 24 hour  Intake 3583.78 ml  Output 1250 ml  Net 2333.78 ml    LBM: Last BM Date: 08/25/20 Baseline Weight: Weight: 50 kg Most recent weight: Weight: 51.8 kg     Palliative Assessment/Data:   Flowsheet Rows     Most Recent Value  Intake Tab  Referral Department Hospitalist  Unit at Time of Referral Cardiac/Telemetry Unit  Palliative Care Primary Diagnosis Sepsis/Infectious Disease  Date Notified 08/24/20  Palliative Care Type New Palliative care  Reason for referral Clarify Goals of Care  Date of Admission 08/24/20  Date first seen by Palliative Care 08/27/20  # of days Palliative referral response time 3 Day(s)  # of days IP prior to Palliative referral 0  Clinical Assessment  Palliative Performance Scale Score 20%  Pain Max last 24 hours Not able to report  Pain Min Last 24 hours Not able to report  Dyspnea Max Last 24 Hours Not able to report  Dyspnea Min Last 24 hours Not able to report  Psychosocial & Spiritual Assessment  Palliative Care Outcomes      Time In: 0850  Time Out: 0940 Time Total: 50 minutes  Greater than 50%  of this time was spent counseling and coordinating care related to the above assessment and plan.  Signed by: Drue Novel, NP   Please contact Palliative Medicine Team phone at [phone] for questions and concerns.  For individual provider: See Shea Evans

## 2020-08-27 NOTE — Progress Notes (Signed)
PROGRESS NOTE    Patient: Jennifer Klein                            PCP: [facility]                    DOB: 03-22-1941            DOA: 08/24/2020 AVW:[id number]             DOS: 08/27/2020, 12:43 PM   LOS: 3 days   Date of Service: The patient was seen and examined on 08/27/2020  Subjective:   The patient was seen and examined this morning, unresponsive, withdraws to pain does not open her eyes. Not communicating Hemodynamically stable No issues overnight  Brief Narrative:   Per HPI:   LENI PANKONIN is a 79 y.o. F with hx advanced Dementia, lives in memory care, no longer verbal, A-fib on Xarelto and HTN who presents with few days progressive sluggishness, now somnolent and blue.  History was collected from triage as the patient is unable to provide independent history due to dementia and altered mental status.  Evidently over the last several days, the patient is been weak, mostly confined to bed. On admission day she was unable to get out of bed, sluggish and lethargic, cold and her extremities were blue in color.   EMS placed her on a nonrebreather, and brought her to the emergency room.  ER:  Sodium 149, creatinine 2.08 (from baseline 1.0), bicarb 14 with normal anion gap, WBC 16.2K.  Temp 90.62F core temp, heart rate 133 in A. fib, blood pressure normal.  Lactate 8.9.  Troponin detectable.   CT head unremarkable.  Chest x-ray clear, CT abdomen without contrast unremarkable.   ECG showed rapid A. fib with right bundle branch block.  Patient was started on IV fluids and empiric antibiotics and the hospitalist service were asked to evaluate  Assessment & Plan:   Principal Problem:   Septic shock ([place]) Active Problems:   Hypertension   Paroxysmal atrial fibrillation (HCC)   Alzheimer's dementia without behavioral disturbance (HCC)   CKD (chronic kidney disease) stage 3, GFR 30-59 ml/min   PAD (peripheral artery disease) (HCC)   Altered mental status    Malnutrition of moderate degree    Septic shock, sepsis-presumed -Sepsis/septic shock physiology resolved -hemodynamically stable now, improved lactic acidosis -Remains unresponsive/nonverbal???  Baseline mental status Tried calling Mr. Chriss Czar no answer Discussed the care with palliative care, we are recommending hospice    -Lactic acid 8.9 >>> 2.4  -Source of infection likely urinary -Urine culture less than 10,000 colonies of insignificant growth, blood cultures negative to date We will reculture urine. -Blood cultures negative to date -We will continue with the broad-spectrum antibiotic started vancomycin, cefepime, Flagyl  We will discontinue vancomycin ---will be discontinued -We will follow up with blood/urine cultures accordingly   Hypokalemia/hypernatremia - Dehydration -Start hypotonic fluids, D5W is to be continued along with added potassium 20 mEq -Sodium 149-147 >> 138, potassium 3.3  Refractory hypokalemia Repleting with 40 mEq IV, along with 20 mEq D5W Repleting with IV  Acute renal failure -due to sepsis/septic shock -dehydration-hypovolemia - Continues to improve -Creatinine continues to improve, 1.86, 1.41 >> 1.13 today -Continue with IV fluid resuscitation -Monitoring BUN/creatinine  Monitoring I's and O's Avoid nephrotoxins  Acute metabolic encephalopathy superimposed on advanced dementia ?  Possibly back to baseline, nonverbal at baseline, and comfortably, withdraws to  pain stimuli May have been exacerbated by dehydration/hypernatremia or septic shock, imposed on advanced dementia Remains lethargic, nonresponsive, eyes are closed does not follow commands-nonverbal  Paroxysmal atrial fibrillation with RVR -Stable Given diltaizem x1 in ER with some improvement.  Will give repeat dose, this time metoprolol IV 5 mg once and then resume home metoprolol at increased dose. -Continue metoprolol -Continue Xarelto  Dementia / Anxiety -We will continue  to hold Buspar, Ativan, memantine until more alert No signs of agitation  Goals of Care  Pending palliative care follow-up Called Mr. Dotson at [phone] no answer Case was discussed with palliative care-we are recommending hospice Palliative care to follow-up with family regarding goals of care Patient will remain DNR  On admission admitting doctor discussed with husband and son-in-law. Confirmed DNR,   Recommending hospice -Consult Palliative Care -If hypotonic fluids and correction of eletrolytes and renal function does not return the patient towards her previous level of function, I would recommend withdrawal of IV fluids after a 24 hours trial and referral to Hospice; husband is in [place] but not local and would need some time to return to the bedside      Nutritional status:  Nutrition Problem: Moderate Malnutrition Etiology: chronic illness (advanced dementia) Signs/Symptoms: moderate fat depletion, moderate muscle depletion, percent weight loss Percent weight loss: 23 % Interventions: Ensure Enlive (each supplement provides 350kcal and 20 grams of protein), Magic cup, Liberalize Diet     Cultures; Blood Cultures x 2 >> negative to date Urine Culture  >>> insignificant growth we will reculture   Antimicrobials: Cefepime 2g IV Vancomycin 1000mg  IV >>08/27/20 Metronidazole 500mg  IV   Consultants: Palliative care   ---------------------------------------------------------------------------------------------------------------------------------------------   DVT prophylaxis: Xarelto  Code Status: DNR  Family Communication:  No family present at bedside, will attempt to reach her husband  Disposition Plan: Anticipate IV fluids for 24 hours, palliative care consultation and monitoring clinical status.  In the setting of her advanced dementia, if the patient does not appear to be progressing back to her previous level of function, we are recommending  [facility] referral Consults called: Palliative     Admission status:    Status is: Inpatient  Remains inpatient appropriate because:Inpatient level of care appropriate due to severity of illness   Dispo: The patient is from: Memory unit              Anticipated d/c is to: SNF -memory unit with palliative care-versus hospice-hospice home              Anticipated d/c date is: 3 days              Patient currently is not medically stable to d/c.        Procedures:   No admission procedures for hospital encounter.     Antimicrobials:  Anti-infectives (From admission, onward)   Start     Dose/Rate Route Frequency Ordered Stop   08/27/20 1200  ceFEPIme (MAXIPIME) 2 g in sodium chloride 0.9 % 100 mL IVPB        2 g 200 mL/hr over 30 Minutes Intravenous Every 12 hours 08/27/20 1053     08/26/20 0600  vancomycin (VANCOREADY) IVPB 750 mg/150 mL  Status:  Discontinued        750 mg 150 mL/hr over 60 Minutes Intravenous Every 48 hours 08/25/20 1336 08/27/20 1124   08/25/20 1500  ceFEPIme (MAXIPIME) 2 g in sodium chloride 0.9 % 100 mL IVPB  Status:  Discontinued  2 g 200 mL/hr over 30 Minutes Intravenous Every 24 hours 08/24/20 1808 08/27/20 1053   08/25/20 0000  metroNIDAZOLE (FLAGYL) IVPB 500 mg        500 mg 100 mL/hr over 60 Minutes Intravenous Every 8 hours 08/24/20 1743     08/24/20 1808  vancomycin variable dose per unstable renal function (pharmacist dosing)  Status:  Discontinued         Does not apply See admin instructions 08/24/20 1808 08/25/20 1336   08/24/20 1445  ceFEPIme (MAXIPIME) 2 g in sodium chloride 0.9 % 100 mL IVPB        2 g 200 mL/hr over 30 Minutes Intravenous  Once 08/24/20 1436 08/24/20 1546   08/24/20 1445  metroNIDAZOLE (FLAGYL) IVPB 500 mg        500 mg 100 mL/hr over 60 Minutes Intravenous  Once 08/24/20 1436 08/24/20 1646   08/24/20 1445  vancomycin (VANCOCIN) IVPB 1000 mg/200 mL premix        1,000 mg 200 mL/hr over 60 Minutes  Intravenous  Once 08/24/20 1436 08/24/20 1800       Medication:  . feeding supplement (ENSURE ENLIVE)  237 mL Oral BID BM  . Rivaroxaban  15 mg Oral Q supper    acetaminophen **OR** acetaminophen   Objective:   Vitals:   08/26/20 2020 08/27/20 0421 08/27/20 0747 08/27/20 1205  BP: (!) 141/89 (!) 140/95 135/87 124/66  Pulse: 78 (!) 104 88 84  Resp: 20 20 18 19   Temp: 97.6 F (36.4 C) 97.7 F (36.5 C) 97.9 F (36.6 C) 97.6 F (36.4 C)  TempSrc: Oral Oral  Oral  SpO2: 96% 98% 99% 100%  Weight:  51.8 kg    Height:        Intake/Output Summary (Last 24 hours) at 08/27/2020 1243 Last data filed at 08/27/2020 1011 Gross per 24 hour  Intake 3583.78 ml  Output 1250 ml  Net 2333.78 ml   Filed Weights   08/25/20 1853 08/26/20 0441 08/27/20 0421  Weight: 50.9 kg 51.1 kg 51.8 kg     Examination:       Physical Exam:   General:   Unresponsive, withdraws to pain stimuli, nonverbal does not open her eyes  HEENT:  Normocephalic, PERRL, otherwise with in Normal limits   Neuro:   Limited exam patient is unresponsive   Lungs:   Clear to auscultation BL, Respirations unlabored, no wheezes / crackles  Cardio:    S1/S2, RRR, No murmure, No Rubs or Gallops   Abdomen:   Soft, non-tender, bowel sounds active all four quadrants,  no guarding or peritoneal signs.  Muscular skeletal:  Limited exam - in bed, withdraws to pain 2+ pulses,  symmetric, No pitting edema  Skin:  Dry, warm to touch, negative for any Rashes, No open wounds  Wounds: Please see nursing documentation            ------------------------------------------------------------------------------------------------------------------------------------------    LABs:  CBC Latest Ref Rng & Units 08/27/2020 08/26/2020 08/25/2020  WBC 4.0 - 10.5 K/uL 9.5 11.9(H) 13.3(H)  Hemoglobin 12.0 - 15.0 g/dL 12.2 13.0 12.7  Hematocrit 36 - 46 % 35.5(L) 37.9 38.9  Platelets 150 - 400 K/uL 163 175 186   CMP Latest Ref Rng & Units  08/27/2020 08/26/2020 08/25/2020  Glucose 70 - 99 mg/dL 123(H) 105(H) 120(H)  BUN 8 - 23 mg/dL 30(H) 45(H) 62(H)  Creatinine 0.44 - 1.00 mg/dL 1.13(H) 1.41(H) 1.86(H)  Sodium 135 - 145 mmol/L 138 141 147(H)  Potassium  3.5 - 5.1 mmol/L 3.3(L) 2.7(LL) 3.2(L)  Chloride 98 - 111 mmol/L 110 111 118(H)  CO2 22 - 32 mmol/L 20(L) 21(L) 22  Calcium 8.9 - 10.3 mg/dL 7.8(L) 8.0(L) 8.3(L)  Total Protein 6.5 - 8.1 g/dL 5.0(L) 5.7(L) -  Total Bilirubin 0.3 - 1.2 mg/dL 0.8 0.9 -  Alkaline Phos 38 - 126 U/L 57 60 -  AST 15 - 41 U/L 31 46(H) -  ALT 0 - 44 U/L 30 36 -       Micro Results Recent Results (from the past 240 hour(s))  Culture, blood (routine x 2)     Status: None (Preliminary result)   Collection Time: 08/24/20  1:39 PM   Specimen: BLOOD  Result Value Ref Range Status   Specimen Description BLOOD LEFT HAND  Final   Special Requests   Final    BOTTLES DRAWN AEROBIC AND ANAEROBIC Blood Culture results may not be optimal due to an inadequate volume of blood received in culture bottles   Culture   Final    NO GROWTH 3 DAYS Performed at [facility], [address] [place], [place] [address]    Report Status PENDING  Incomplete  Culture, blood (routine x 2)     Status: None (Preliminary result)   Collection Time: 08/24/20  1:39 PM   Specimen: BLOOD  Result Value Ref Range Status   Specimen Description BLOOD RIGHT FOREARM  Final   Special Requests   Final    BOTTLES DRAWN AEROBIC AND ANAEROBIC Blood Culture results may not be optimal due to an inadequate volume of blood received in culture bottles   Culture   Final    NO GROWTH 3 DAYS Performed at [facility], [address], [place], [place] [address]    Report Status PENDING  Incomplete  SARS Coronavirus 2 by RT PCR (hospital order, performed in [place] hospital lab) Nasopharyngeal     Status: None   Collection Time: 08/24/20  6:54 PM   Specimen: Nasopharyngeal  Result Value Ref Range Status   SARS  Coronavirus 2 NEGATIVE NEGATIVE Final    Comment: (NOTE) SARS-CoV-2 target nucleic acids are NOT DETECTED.  The SARS-CoV-2 RNA is generally detectable in upper and lower respiratory specimens during the acute phase of infection. The lowest concentration of SARS-CoV-2 viral copies this assay can detect is 250 copies / mL. A negative result does not preclude SARS-CoV-2 infection and should not be used as the sole basis for treatment or other patient management decisions.  A negative result may occur with improper specimen collection / handling, submission of specimen other than nasopharyngeal swab, presence of viral mutation(s) within the areas targeted by this assay, and inadequate number of viral copies (<250 copies / mL). A negative result must be combined with clinical observations, patient history, and epidemiological information.  Fact Sheet for Patients:   StrictlyIdeas.no  Fact Sheet for Healthcare Providers: BankingDealers.co.za  This test is not yet approved or  cleared by the [place] FDA and has been authorized for detection and/or diagnosis of SARS-CoV-2 by FDA under an Emergency Use Authorization (EUA).  This EUA will remain in effect (meaning this test can be used) for the duration of the COVID-19 declaration under Section 564(b)(1) of the Act, 21 U.S.C. section 360bbb-3(b)(1), unless the authorization is terminated or revoked sooner.  Performed at [facility], [address] [place],  [address]   Urine Culture     Status: Abnormal   Collection Time: 08/24/20  6:54 PM   Specimen: Urine, Random  Result Value Ref Range Status   Specimen Description   Final    URINE, RANDOM Performed at [facility], [address] [place], [place] [address]    Special Requests   Final    NONE Performed at [facility], [address] [place], [place] [address]    Culture (A)  Final     <10,000 COLONIES/mL INSIGNIFICANT GROWTH Performed at [facility] [address] [place], [place] [address]    Report Status 08/26/2020 FINAL  Final    Radiology Reports CT Abdomen Pelvis Wo Contrast  Result Date: 08/24/2020 CLINICAL DATA:  Diffuse abdominal pain EXAM: CT ABDOMEN AND PELVIS WITHOUT CONTRAST TECHNIQUE: Multidetector CT imaging of the abdomen and pelvis was performed following the standard protocol without IV contrast. COMPARISON:  06/23/2018 FINDINGS: Lower chest: The visualized lung bases are clear bilaterally. Mild calcification of the mitral valve annulus. Cardiac size within normal limits. Tiny hiatal hernia. Hepatobiliary: Cholelithiasis without superimposed pericholecystic inflammatory change. Heterogeneity of the liver parenchyma likely relates to beam hardening from adjacent osseous structures. The liver is otherwise unremarkable. No intra or extrahepatic biliary ductal dilation. Pancreas: Unremarkable Spleen: Unremarkable Adrenals/Urinary Tract: Adrenal glands are unremarkable. Kidneys are normal. Bladder is unremarkable. Stomach/Bowel: Stomach, small bowel, and large bowel are unremarkable save for mild sigmoid diverticulosis. Appendix normal. No free intraperitoneal gas or fluid. Vascular/Lymphatic: The abdominal vasculature is age-appropriate with moderate aortoiliac atherosclerotic calcification. No aneurysm. No pathologic adenopathy within the abdomen and pelvis. Reproductive: Uterus and bilateral adnexa are unremarkable. Other: The rectum is unremarkable. Musculoskeletal: Degenerative changes are seen within the lumbar spine. No acute bone abnormality. IMPRESSION: 1. No definite radiographic explanation for the patient's reported symptoms. 2. Cholelithiasis without superimposed pericholecystic inflammatory change. 3. Mild sigmoid diverticulosis. Aortic Atherosclerosis (ICD10-I70.0). Electronically Signed   By: Fidela Salisbury MD   On: 08/24/2020 16:48   CT Head Wo  Contrast  Result Date: 08/24/2020 CLINICAL DATA:  Increasing lethargy, mental status change. EXAM: CT HEAD WITHOUT CONTRAST TECHNIQUE: Contiguous axial images were obtained from the base of the skull through the vertex without intravenous contrast. COMPARISON:  Head CT dated 06/18/2020 FINDINGS: Brain: Generalized age related parenchymal volume loss with commensurate dilatation of the ventricles and sulci, stable. Chronic small vessel ischemic changes again noted within the bilateral periventricular and subcortical white matter regions, stable. 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 There is no mass, hemorrhage, edema or other evidence of acute parenchymal abnormality. No extra-axial hemorrhage. Vascular: No hyperdense vessel or unexpected calcification. Skull: Normal. Negative for fracture or focal lesion. Sinuses/Orbits: No acute finding. Other: None. IMPRESSION: 1. No acute findings. No intracranial mass, hemorrhage or edema. 2. Chronic small vessel ischemic changes in the white matter and RIGHT basal ganglia. Electronically Signed   By: Franki Cabot M.D.   On: 08/24/2020 14:47   DG Chest Portable 1 View  Result Date: 08/24/2020 CLINICAL DATA:  Altered mental status EXAM: PORTABLE CHEST 1 VIEW COMPARISON:  Chest x-rays dated 02/07/2019 and 01/08/2019. FINDINGS: Heart size and mediastinal contours are within normal limits. Lungs are clear. No pleural effusion. Osseous structures about the chest are unremarkable. IMPRESSION: No active disease. No evidence of pneumonia or pulmonary edema. Electronically Signed   By: Franki Cabot M.D.   On: 08/24/2020 14:27    SIGNED: Deatra James, MD, FACP, FHM. [facility],  Pager (please use [email or web address] to page/text)  If 7PM-7AM, please contact night-coverage  [email or web address] [email or web address] [facility] 08/27/2020, 12:43 PM

## 2020-08-28 LAB — CBC WITH DIFFERENTIAL/PLATELET
Abs Immature Granulocytes: 0.14 10*3/uL — ABNORMAL HIGH (ref 0.00–0.07)
Basophils Absolute: 0.1 10*3/uL (ref 0.0–0.1)
Basophils Relative: 1 %
Eosinophils Absolute: 0.3 10*3/uL (ref 0.0–0.5)
Eosinophils Relative: 4 %
HCT: 38.9 % (ref 36.0–46.0)
Hemoglobin: 13.2 g/dL (ref 12.0–15.0)
Immature Granulocytes: 2 %
Lymphocytes Relative: 12 %
Lymphs Abs: 1.1 10*3/uL (ref 0.7–4.0)
MCH: 30.2 pg (ref 26.0–34.0)
MCHC: 33.9 g/dL (ref 30.0–36.0)
MCV: 89 fL (ref 80.0–100.0)
Monocytes Absolute: 0.8 10*3/uL (ref 0.1–1.0)
Monocytes Relative: 9 %
Neutro Abs: 7 10*3/uL (ref 1.7–7.7)
Neutrophils Relative %: 72 %
Platelets: 183 10*3/uL (ref 150–400)
RBC: 4.37 MIL/uL (ref 3.87–5.11)
RDW: 13.9 % (ref 11.5–15.5)
WBC: 9.4 10*3/uL (ref 4.0–10.5)
nRBC: 0 % (ref 0.0–0.2)

## 2020-08-28 LAB — COMPREHENSIVE METABOLIC PANEL
ALT: 25 U/L (ref 0–44)
AST: 27 U/L (ref 15–41)
Albumin: 2.6 g/dL — ABNORMAL LOW (ref 3.5–5.0)
Alkaline Phosphatase: 56 U/L (ref 38–126)
Anion gap: 4 — ABNORMAL LOW (ref 5–15)
BUN: 24 mg/dL — ABNORMAL HIGH (ref 8–23)
CO2: 21 mmol/L — ABNORMAL LOW (ref 22–32)
Calcium: 7.9 mg/dL — ABNORMAL LOW (ref 8.9–10.3)
Chloride: 113 mmol/L — ABNORMAL HIGH (ref 98–111)
Creatinine, Ser: 1.02 mg/dL — ABNORMAL HIGH (ref 0.44–1.00)
GFR calc Af Amer: >60 mL/min (ref >60)
GFR calc non Af Amer: 52 mL/min — ABNORMAL LOW (ref >60)
Glucose, Bld: 111 mg/dL — ABNORMAL HIGH (ref 70–99)
Potassium: 3.8 mmol/L (ref 3.5–5.1)
Sodium: 138 mmol/L (ref 135–145)
Total Bilirubin: 0.6 mg/dL (ref 0.3–1.2)
Total Protein: 5.4 g/dL — ABNORMAL LOW (ref 6.5–8.1)

## 2020-08-28 LAB — URINE CULTURE: Culture: NO GROWTH

## 2020-08-28 LAB — PROCALCITONIN: Procalcitonin: <0.1 ng/mL

## 2020-08-28 NOTE — Progress Notes (Signed)
Mobility Specialist - Progress Note   08/28/20 1528  Mobility  Activity Refused mobility  Mobility performed by Mobility specialist    Pt refused mobility, no reason specified. Pt verbally unresponsive. Will continue to monitor.   Kathee Delton Mobility Specialist 08/28/20, 5:01 PM

## 2020-08-28 NOTE — Progress Notes (Signed)
Palliative: Mrs. Hable is sitting up quietly in bed.  She appears chronically ill and quite frail.  She is alert, but does not make eye contact, even briefly, today.  She does not respond to any of my questions.  I offer her applesauce, and she eats a few spoonfuls without overt signs and symptoms of aspiration.  She declines other foods or drinks.  I do not believe she is able to make her basic needs known.  There is no family at bedside at this time.  Call to spouse, Avanelle Pixley, no answer, unable to leave voicemail message.  No additional number for spouse. Call to daughter, Doreene Eland. She and Mr. Alarid are in [place] car, driving back from the [place].  Mrs. Faerber has no natural children, [name] is her stepdaughter.  Mr. Mariana Arn shares that his wife has been a resident of [place] for 3 years in March.  He states that he has been unable to see her for the last 18 months or so due to Covid.  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  [name] shares that she always was so well put together, and expresses her sorrow about Mrs. Pemble's current condition.  They share that Mrs. Frontera has had lots of hospital visits since she was admitted with [facility] due to UTIs, dehydration, and falls.  They sit until the last few months she was a walker, and would constantly walk at the facility.  They share that she is lost weight over the last few months, from 140 pounds to 113 pounds.  We talked about DNR status.  [name] states that she believes Mrs. Duignan had a living will before she met Mr. Dotson.  They endorse that she has elected DNR for herself.  We talked about the concept of "let nature take its course".  I ask if Mrs. Mehrer has done the work that she came here to do.  Family states that they feel like her work is done, the person she was 5 years ago would not want to continue in her current existence.  We talked about the benefits of  outpatient palliative and hospice care.  Family states that they would be open to hospice care at this time.  We talked about disposition.  Mr. Fitzgibbons states that he does not believe [facility] would be able to accept her in her current condition.  Multidisciplinary team is working for appropriate disposition with hospice care for Mrs. Beryl Meager.  Conference with attending, bedside nursing staff, transition of care team related to patient condition, needs, goals of care.  Plan: At this point, continue to treat the treatable but no CPR or intubation.  Would benefit from outpatient palliative services after short-term rehab/returning to [facility] memory care.         16 minutes  Quinn Axe, NP Palliative medicine team Team phone [phone] Greater than 50% of this time was spent counseling and coordinating care related to the above assessment and plan.

## 2020-08-28 NOTE — Progress Notes (Signed)
PROGRESS NOTE    Jennifer Klein  PPI:[id number] DOB: [birth year] DOA: 08/24/2020 PCP: Orvis Brill, [facility]  Brief Narrative:  Per HPI:   Jennifer Ramdass Dodsonis a 79 y.o.Fwith hx advanced Dementia, lives in memory care, no longer verbal, A-fib on Xarelto and HTNwho presents with few days progressive sluggishness, now somnolent and blue.  History was collected from triage as the patient is unable to provide independent history due to dementia and altered mental status.  Evidently over the last several days, the patient is been weak, mostly confined to bed. On admission day she was unable to get out of bed, sluggish and lethargic, cold and her extremities were blue in color.  EMS placed her on a nonrebreather, and brought her to the emergency room.  9/8: Patient seen and examined.  Verbally unresponsive.  Appears frail.  Palliative care had a lengthy discussion with the patient's family members today.  Leaning more towards a hospice/comfort measures disposition.  At this time we are still pursuing current scope of treatment without escalation.   Assessment & Plan:   Principal Problem:   Septic shock ([place]) Active Problems:   Hypertension   Paroxysmal atrial fibrillation (HCC)   Alzheimer's dementia without behavioral disturbance (HCC)   CKD (chronic kidney disease) stage 3, GFR 30-59 ml/min   PAD (peripheral artery disease) (HCC)   Altered mental status   Malnutrition of moderate degree   Goals of care, counseling/discussion   Palliative care by specialist  Septic shock, sepsis-presumed -Shock physiology resolved but the actual insult was unclear.  The patient unfortunately remains unresponsive and nonverbal.  Apparently her baseline is not far from this but she has had a significant decline.  9/8: Palliative care has had a lengthy discussion with the family.  It appears we are moving towards a hospice/comfort measures route.  Case management is involved and we are attempting to  determine whether the assisted living facility can take the patient back in hospice care. Plan: Continue antibiotics for now ALF to evaluate patient tomorrow morning Follow cultures, no growth to date   Hypokalemia/hypernatremia - Dehydration Dextrose containing fluids initiated.  We will continue for now as patient has no p.o. intake.  Will need to monitor glycemic control and sodium  Refractory hypokalemia No replacement indicated today  Acute renal failure -due to sepsis/septic shock -dehydration-hypovolemia - Continues to improve -Creatinine continues to improve, 1.86, 1.41 >> 1.13 today -Continue with IV fluid resuscitation -Monitoring BUN/creatinine  Monitoring I's and O's Avoid nephrotoxins  Acute metabolic encephalopathy superimposed on advanced dementia ?  Possibly back to baseline, nonverbal at baseline, and comfortably, withdraws to pain stimuli May have been exacerbated by dehydration/hypernatremia or septic shock, imposed on advanced dementia Remains lethargic, nonresponsive, eyes are closed does not follow commands-nonverbal  Paroxysmal atrial fibrillation with RVR -Stable -Continuemetoprolol -ContinueXarelto  Dementia/ Anxiety -We will continue to hold Buspar, Ativan, memantine until more alert No signs of agitation  Goals of Care Palliative care is followed up with patient and other family.  Continue to recommend hospice.  Tentative plan to discharge back to assisted living facility with hospice care tomorrow.  On admission admitting doctor discussed with husband and son-in-law. Confirmed DNR,     DVT prophylaxis: Xarelto Code Status: DNR Family Communication: None today.  Palliative care discussed with family Disposition Plan: Status is: Inpatient  Remains inpatient appropriate because:Unsafe d/c plan   Dispo: The patient is from: ALF              Anticipated d/c  is to: ALF              Anticipated d/c date is: 1 day               Patient currently is not medically stable to d/c.  Remains inpatient for now.  Tentative plan to discharge back to ALF with hospice services tomorrow       Consultants:   Palliative care  Procedures:  None  Antimicrobials:   Cefepime  Flagyl   Subjective: Seen and examined.  Verbally unresponsive  Objective: Vitals:   08/27/20 2003 08/28/20 0530 08/28/20 0833 08/28/20 1214  BP: 122/81 123/79 135/80 134/86  Pulse: 78 93 93 98  Resp: 17 16 19 19   Temp: 98.6 F (37 C) 98.5 F (36.9 C) 98.2 F (36.8 C) 98.2 F (36.8 C)  TempSrc:  Oral    SpO2: 99% 100% 100% 99%  Weight:  52 kg    Height:        Intake/Output Summary (Last 24 hours) at 08/28/2020 1525 Last data filed at 08/28/2020 1347 Gross per 24 hour  Intake 983.38 ml  Output 900 ml  Net 83.38 ml   Filed Weights   08/26/20 0441 08/27/20 0421 08/28/20 0530  Weight: 51.1 kg 51.8 kg 52 kg    Examination:  General exam: Appears frail.  Chronically ill. Respiratory system: Poor inspiratory effort.  Normal work of breathing Cardiovascular system: Tachycardic, regular rhythm, no murmurs Gastrointestinal system: Abdomen is nondistended, soft and nontender. No organomegaly or masses felt. Normal bowel sounds heard. Central nervous system: Unable to assess.  No obvious focal deficits extremities: Unable to assess Skin: No rashes, lesions or ulcers Psychiatry: Unable to assess   Data Reviewed: I have personally reviewed following labs and imaging studies  CBC: Recent Labs  Lab 08/24/20 1339 08/25/20 0415 08/26/20 0517 08/27/20 0455 08/28/20 0545  WBC 16.2* 13.3* 11.9* 9.5 9.4  NEUTROABS 14.3*  --  9.3* 7.2 7.0  HGB 16.8* 12.7 13.0 12.2 13.2  HCT 53.4* 38.9 37.9 35.5* 38.9  MCV 94.3 91.3 87.5 88.5 89.0  PLT 303 186 175 163 081   Basic Metabolic Panel: Recent Labs  Lab 08/24/20 1339 08/25/20 0415 08/26/20 0517 08/27/20 0455 08/28/20 0545  NA 149* 147* 141 138 138  K 4.3 3.2* 2.7* 3.3* 3.8  CL  120* 118* 111 110 113*  CO2 14* 22 21* 20* 21*  GLUCOSE 137* 120* 105* 123* 111*  BUN 62* 62* 45* 30* 24*  CREATININE 2.08* 1.86* 1.41* 1.13* 1.02*  CALCIUM 9.6 8.3* 8.0* 7.8* 7.9*  MG  --   --  1.8  --   --   PHOS  --   --   --  2.2*  --    GFR: Estimated Creatinine Clearance: 36.1 mL/min (A) (by C-G formula based on SCr of 1.02 mg/dL (H)). Liver Function Tests: Recent Labs  Lab 08/24/20 1339 08/26/20 0517 08/27/20 0455 08/28/20 0545  AST 56* 46* 31 27  ALT 28 36 30 25  ALKPHOS 92 60 57 56  BILITOT 1.8* 0.9 0.8 0.6  PROT 8.2* 5.7* 5.0* 5.4*  ALBUMIN 4.3 2.9* 2.5* 2.6*   No results for input(s): LIPASE, AMYLASE in the last 168 hours. No results for input(s): AMMONIA in the last 168 hours. Coagulation Profile: No results for input(s): INR, PROTIME in the last 168 hours. Cardiac Enzymes: No results for input(s): CKTOTAL, CKMB, CKMBINDEX, TROPONINI in the last 168 hours. BNP (last 3 results) No results for input(s): PROBNP in  the last 8760 hours. HbA1C: No results for input(s): HGBA1C in the last 72 hours. CBG: No results for input(s): GLUCAP in the last 168 hours. Lipid Profile: No results for input(s): CHOL, HDL, LDLCALC, TRIG, CHOLHDL, LDLDIRECT in the last 72 hours. Thyroid Function Tests: No results for input(s): TSH, T4TOTAL, FREET4, T3FREE, THYROIDAB in the last 72 hours. Anemia Panel: No results for input(s): VITAMINB12, FOLATE, FERRITIN, TIBC, IRON, RETICCTPCT in the last 72 hours. Sepsis Labs: Recent Labs  Lab 08/24/20 1615 08/24/20 1854 08/24/20 2206 08/25/20 1643 08/28/20 0545  PROCALCITON  --   --   --   --  <0.10  LATICACIDVEN 6.2* 3.7* 2.4* 1.3  --     Recent Results (from the past 240 hour(s))  Culture, blood (routine x 2)     Status: None (Preliminary result)   Collection Time: 08/24/20  1:39 PM   Specimen: BLOOD  Result Value Ref Range Status   Specimen Description BLOOD LEFT HAND  Final   Special Requests   Final    BOTTLES DRAWN AEROBIC  AND ANAEROBIC Blood Culture results may not be optimal due to an inadequate volume of blood received in culture bottles   Culture   Final    NO GROWTH 4 DAYS Performed at [facility], [address], [place], [place] [address]    Report Status PENDING  Incomplete  Culture, blood (routine x 2)     Status: None (Preliminary result)   Collection Time: 08/24/20  1:39 PM   Specimen: BLOOD  Result Value Ref Range Status   Specimen Description BLOOD RIGHT FOREARM  Final   Special Requests   Final    BOTTLES DRAWN AEROBIC AND ANAEROBIC Blood Culture results may not be optimal due to an inadequate volume of blood received in culture bottles   Culture   Final    NO GROWTH 4 DAYS Performed at [facility], [address] [place], [place] [address]    Report Status PENDING  Incomplete  SARS Coronavirus 2 by RT PCR (hospital order, performed in [place] hospital lab) Nasopharyngeal     Status: None   Collection Time: 08/24/20  6:54 PM   Specimen: Nasopharyngeal  Result Value Ref Range Status   SARS Coronavirus 2 NEGATIVE NEGATIVE Final    Comment: (NOTE) SARS-CoV-2 target nucleic acids are NOT DETECTED.  The SARS-CoV-2 RNA is generally detectable in upper and lower respiratory specimens during the acute phase of infection. The lowest concentration of SARS-CoV-2 viral copies this assay can detect is 250 copies / mL. A negative result does not preclude SARS-CoV-2 infection and should not be used as the sole basis for treatment or other patient management decisions.  A negative result may occur with improper specimen collection / handling, submission of specimen other than nasopharyngeal swab, presence of viral mutation(s) within the areas targeted by this assay, and inadequate number of viral copies (<250 copies / mL). A negative result must be combined with clinical observations, patient history, and epidemiological information.  Fact Sheet for Patients:     StrictlyIdeas.no  Fact Sheet for Healthcare Providers: BankingDealers.co.za  This test is not yet approved or  cleared by the [place] FDA and has been authorized for detection and/or diagnosis of SARS-CoV-2 by FDA under an Emergency Use Authorization (EUA).  This EUA will remain in effect (meaning this test can be used) for the duration of the COVID-19 declaration under Section 564(b)(1) of the Act, 21 U.S.C. section 360bbb-3(b)(1), unless the authorization is terminated  or revoked sooner.  Performed at [facility], [address] [place], [place] [address]   Urine Culture     Status: Abnormal   Collection Time: 08/24/20  6:54 PM   Specimen: Urine, Random  Result Value Ref Range Status   Specimen Description   Final    URINE, RANDOM Performed at [facility], [address] [place], [place] [address]    Special Requests   Final    NONE Performed at [facility], [address] [place], [place] [address]    Culture (A)  Final    <10,000 COLONIES/mL INSIGNIFICANT GROWTH Performed at [facility], [place] [address] [place], [place] [address]    Report Status 08/26/2020 FINAL  Final         Radiology Studies: No results found.      Scheduled Meds: . feeding supplement (ENSURE ENLIVE)  237 mL Oral BID BM  . Rivaroxaban  15 mg Oral Q supper   Continuous Infusions: . ceFEPime (MAXIPIME) IV 2 g (08/28/20 1159)  . dextrose 5 % with KCl 20 mEq / L 20 mEq (08/28/20 0336)  . metronidazole 500 mg (08/28/20 0741)     LOS: 4 days    Time spent: 25 minutes    Sidney Ace, MD [facility] Pager [phone]  If 7PM-7AM, please contact night-coverage 08/28/2020, 3:25 PM

## 2020-08-29 DIAGNOSIS — Z7189 Other specified counseling: Secondary | ICD-10-CM

## 2020-08-29 LAB — BASIC METABOLIC PANEL
Anion gap: 9 (ref 5–15)
BUN: 19 mg/dL (ref 8–23)
CO2: 18 mmol/L — ABNORMAL LOW (ref 22–32)
Calcium: 8.3 mg/dL — ABNORMAL LOW (ref 8.9–10.3)
Chloride: 112 mmol/L — ABNORMAL HIGH (ref 98–111)
Creatinine, Ser: 1.07 mg/dL — ABNORMAL HIGH (ref 0.44–1.00)
GFR calc Af Amer: 57 mL/min — ABNORMAL LOW (ref >60)
GFR calc non Af Amer: 49 mL/min — ABNORMAL LOW (ref >60)
Glucose, Bld: 101 mg/dL — ABNORMAL HIGH (ref 70–99)
Potassium: 3.9 mmol/L (ref 3.5–5.1)
Sodium: 139 mmol/L (ref 135–145)

## 2020-08-29 LAB — CULTURE, BLOOD (ROUTINE X 2)
Culture: NO GROWTH
Culture: NO GROWTH

## 2020-08-29 LAB — PHOSPHORUS: Phosphorus: 2.3 mg/dL — ABNORMAL LOW (ref 2.5–4.6)

## 2020-08-29 LAB — MAGNESIUM: Magnesium: 1.7 mg/dL (ref 1.7–2.4)

## 2020-08-29 NOTE — Discharge Summary (Signed)
Physician Discharge Summary  Jennifer Klein HQP:[id number] DOB: [birth year] DOA: 08/24/2020  PCP: [facility]  Admit date: 08/24/2020 Discharge date: 08/29/2020  Admitted From: ALF Disposition: Back to ALF with hospice services  Home Health: No Equipment/Devices: None Discharge Condition: Hospice CODE STATUS: DNR Diet recommendation: Regular / Dysphagia   Brief/Interim Summary: Per HPI:  Jennifer Gafford Dodsonis a 79 y.o.Fwith hx advanced Dementia, lives in memory care, no longer verbal, A-fib on Xarelto and HTNwho presents with few days progressive sluggishness, now somnolent and blue.  History was collected from triage as the patient is unable to provide independent history due to dementia and altered mental status.  Evidently over the last several days, the patient is been weak, mostly confined to bed. On admission day she was unable to get out of bed, sluggish and lethargic, cold and her extremities were blue in color.  EMS placed her on a nonrebreather, and brought her to the emergency room.  9/8: Patient seen and examined.  Verbally unresponsive.  Appears frail.  Palliative care had a lengthy discussion with the patient's family members today.  Leaning more towards a hospice/comfort measures disposition.  At this time we are still pursuing current scope of treatment without escalation.  9/9: Patient seen and examined on the day of discharge.  Remains verbally unresponsive.  Does open eyes.  Does not follow commands.  Palliative care has spoken at length with patient's family.  Decision to return to ALF with hospice services.  Family is ready for comfort care and focusing on the patient's quality of life.  Patient is not visibly in pain or anxious.  Will not add on any pain or anxiety control medications at time of discharge.  Defer these decisions to hospice representatives once return to assisted living facility.  Medications not focused on patient comfort have been  discontinued.  Discharge Diagnoses:  Principal Problem:   Septic shock ([place]) Active Problems:   Hypertension   Paroxysmal atrial fibrillation (HCC)   Alzheimer's dementia without behavioral disturbance (HCC)   CKD (chronic kidney disease) stage 3, GFR 30-59 ml/min   PAD (peripheral artery disease) (HCC)   Altered mental status   Malnutrition of moderate degree   Goals of care, counseling/discussion   Palliative care by specialist  Septic shock, sepsis-presumed -Shock physiology resolved but the actual insult was unclear.  The patient unfortunately remains unresponsive and nonverbal.  Apparently her baseline is not far from this but she has had a significant decline.  9/8: Palliative care has had a lengthy discussion with the family.  It appears we are moving towards a hospice/comfort measures route.  -Hospice services available at assisted living facility confirmed the time of discharge.  No antibiotics indicated on discharge.    Hypokalemia/hypernatremia- Dehydration Dextrose containing fluids initiated as patient has poor to no p.o. intake.  This will be discontinued at time of discharge.  Diet recommendations per hospice representatives  Refractory hypokalemia No replacement indicated today  Acute renal failure -due to sepsis/septic shock-dehydration-hypovolemia Improved at time of discharge  Monitoring I's and O's Avoid nephrotoxins  Acute metabolic encephalopathy superimposed on advanced dementia ? Possibly back to baseline, nonverbal at baseline, and comfortably, withdraws to pain stimuli May have been exacerbated bydehydration/hypernatremia or septic shock, imposed on advanced dementia Remains lethargic, nonresponsive, eyes are closed does not follow commands-nonverbal  Paroxysmal atrial fibrillation with RVR -Stable Metoprolol has been continued for rate control at time of discharge.  Xarelto on hold  Dementia/ Anxiety Continue to hold BuSpar, Ativan,  memantine at time of discharge  Goals of Care DNR status confirmed on time of admission.  Palliative care following during hospital stay.  Patient return to assisted living facility with hospice backup.  Family is aware.  Discharge Instructions  Discharge Instructions    Diet - low sodium heart healthy   Complete by: As directed    Diet - low sodium heart healthy   Complete by: As directed    Increase activity slowly   Complete by: As directed    Increase activity slowly   Complete by: As directed      Allergies as of 08/29/2020      Reactions   Darvon [propoxyphene Hcl]    As a child      Medication List    STOP taking these medications   acetaminophen 500 MG tablet Commonly known as: TYLENOL   allopurinol 100 MG tablet Commonly known as: ZYLOPRIM   atorvastatin 20 MG tablet Commonly known as: LIPITOR   busPIRone 5 MG tablet Commonly known as: BUSPAR   cholecalciferol 25 MCG (1000 UNIT) tablet Commonly known as: VITAMIN D   donepezil 10 MG tablet Commonly known as: ARICEPT   feeding supplement (PRO-STAT SUGAR FREE 64) Liqd   GNP Vitamin B-12 500 MCG tablet Generic drug: vitamin B-12   LORazepam 0.5 MG tablet Commonly known as: ATIVAN   magnesium hydroxide 400 MG/5ML suspension Commonly known as: MILK OF MAGNESIA   memantine 10 MG tablet Commonly known as: NAMENDA   Mi-Acid 678-938-10 MG/5ML suspension Generic drug: alum & mag hydroxide-simeth   neomycin-bacitracin-polymyxin [phone] ointment   pantoprazole 40 MG tablet Commonly known as: PROTONIX   Xarelto 15 MG Tabs tablet Generic drug: Rivaroxaban     TAKE these medications   guaifenesin 100 MG/5ML syrup Commonly known as: ROBITUSSIN Take 200 mg by mouth every 6 (six) hours as needed for cough.   loperamide 2 MG capsule Commonly known as: IMODIUM Take 2 mg by mouth as needed for diarrhea or loose stools.   metoprolol tartrate 25 MG tablet Commonly known as: LOPRESSOR TAKE 1 TABLET  BY MOUTH 2 TIMES PER DAY       Allergies  Allergen Reactions  . Darvon [Propoxyphene Hcl]     As a child    Consultations:  Palliative care   Procedures/Studies: CT Abdomen Pelvis Wo Contrast  Result Date: 08/24/2020 CLINICAL DATA:  Diffuse abdominal pain EXAM: CT ABDOMEN AND PELVIS WITHOUT CONTRAST TECHNIQUE: Multidetector CT imaging of the abdomen and pelvis was performed following the standard protocol without IV contrast. COMPARISON:  06/23/2018 FINDINGS: Lower chest: The visualized lung bases are clear bilaterally. Mild calcification of the mitral valve annulus. Cardiac size within normal limits. Tiny hiatal hernia. Hepatobiliary: Cholelithiasis without superimposed pericholecystic inflammatory change. Heterogeneity of the liver parenchyma likely relates to beam hardening from adjacent osseous structures. The liver is otherwise unremarkable. No intra or extrahepatic biliary ductal dilation. Pancreas: Unremarkable Spleen: Unremarkable Adrenals/Urinary Tract: Adrenal glands are unremarkable. Kidneys are normal. Bladder is unremarkable. Stomach/Bowel: Stomach, small bowel, and large bowel are unremarkable save for mild sigmoid diverticulosis. Appendix normal. No free intraperitoneal gas or fluid. Vascular/Lymphatic: The abdominal vasculature is age-appropriate with moderate aortoiliac atherosclerotic calcification. No aneurysm. No pathologic adenopathy within the abdomen and pelvis. Reproductive: Uterus and bilateral adnexa are unremarkable. Other: The rectum is unremarkable. Musculoskeletal: Degenerative changes are seen within the lumbar spine. No acute bone abnormality. IMPRESSION: 1. No definite radiographic explanation for the patient's reported symptoms. 2. Cholelithiasis without superimposed pericholecystic inflammatory change. 3.  Mild sigmoid diverticulosis. Aortic Atherosclerosis (ICD10-I70.0). Electronically Signed   By: Fidela Salisbury MD   On: 08/24/2020 16:48   CT Head Wo  Contrast  Result Date: 08/24/2020 CLINICAL DATA:  Increasing lethargy, mental status change. EXAM: CT HEAD WITHOUT CONTRAST TECHNIQUE: Contiguous axial images were obtained from the base of the skull through the vertex without intravenous contrast. COMPARISON:  Head CT dated 06/18/2020 FINDINGS: Brain: Generalized age related parenchymal volume loss with commensurate dilatation of the ventricles and sulci, stable. Chronic small vessel ischemic changes again noted within the bilateral periventricular and subcortical white matter regions, stable. 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 There is no mass, hemorrhage, edema or other evidence of acute parenchymal abnormality. No extra-axial hemorrhage. Vascular: No hyperdense vessel or unexpected calcification. Skull: Normal. Negative for fracture or focal lesion. Sinuses/Orbits: No acute finding. Other: None. IMPRESSION: 1. No acute findings. No intracranial mass, hemorrhage or edema. 2. Chronic small vessel ischemic changes in the white matter and RIGHT basal ganglia. Electronically Signed   By: Franki Cabot M.D.   On: 08/24/2020 14:47   DG Chest Portable 1 View  Result Date: 08/24/2020 CLINICAL DATA:  Altered mental status EXAM: PORTABLE CHEST 1 VIEW COMPARISON:  Chest x-rays dated 02/07/2019 and 01/08/2019. FINDINGS: Heart size and mediastinal contours are within normal limits. Lungs are clear. No pleural effusion. Osseous structures about the chest are unremarkable. IMPRESSION: No active disease. No evidence of pneumonia or pulmonary edema. Electronically Signed   By: Franki Cabot M.D.   On: 08/24/2020 14:27    (Echo, Carotid, EGD, Colonoscopy, ERCP)    Subjective: Seen and examined at time of discharge.  Remains verbally unresponsive.  In no visible distress.  Appears frail.  Discharge Exam: Vitals:   08/29/20 0528 08/29/20 0722  BP: (!) 141/97 (!) 142/101  Pulse: (!) 101 99  Resp: 17 17  Temp:  98.1 F  (36.7 C)  SpO2: 100% 99%   Vitals:   08/28/20 2028 08/29/20 0528 08/29/20 0534 08/29/20 0722  BP: (!) 147/90 (!) 141/97  (!) 142/101  Pulse: 94 (!) 101  99  Resp: 18 17  17   Temp: 97.7 F (36.5 C)   98.1 F (36.7 C)  TempSrc: Oral   Oral  SpO2: 100% 100%  99%  Weight:   52.5 kg   Height:        General: Patient is alert, lethargic, no apparent distress Cardiovascular: Regular rate, regular rhythm, no murmurs Respiratory: CTA bilaterally, no wheezing, no rhonchi Abdominal: Soft, NT, ND, bowel sounds + Extremities: no edema, no cyanosis    The results of significant diagnostics from this hospitalization (including imaging, microbiology, ancillary and laboratory) are listed below for reference.     Microbiology: Recent Results (from the past 240 hour(s))  Culture, blood (routine x 2)     Status: None   Collection Time: 08/24/20  1:39 PM   Specimen: BLOOD  Result Value Ref Range Status   Specimen Description BLOOD LEFT HAND  Final   Special Requests   Final    BOTTLES DRAWN AEROBIC AND ANAEROBIC Blood Culture results may not be optimal due to an inadequate volume of blood received in culture bottles   Culture   Final    NO GROWTH 5 DAYS Performed at [facility], [address], [place], [place] [address]    Report Status 08/29/2020 FINAL  Final  Culture, blood (routine x 2)     Status: None   Collection  Time: 08/24/20  1:39 PM   Specimen: BLOOD  Result Value Ref Range Status   Specimen Description BLOOD RIGHT FOREARM  Final   Special Requests   Final    BOTTLES DRAWN AEROBIC AND ANAEROBIC Blood Culture results may not be optimal due to an inadequate volume of blood received in culture bottles   Culture   Final    NO GROWTH 5 DAYS Performed at [facility], [address], [place], [place] [address]    Report Status 08/29/2020 FINAL  Final  SARS Coronavirus 2 by RT PCR (hospital order, performed in [facility] hospital lab) Nasopharyngeal      Status: None   Collection Time: 08/24/20  6:54 PM   Specimen: Nasopharyngeal  Result Value Ref Range Status   SARS Coronavirus 2 NEGATIVE NEGATIVE Final    Comment: (NOTE) SARS-CoV-2 target nucleic acids are NOT DETECTED.  The SARS-CoV-2 RNA is generally detectable in upper and lower respiratory specimens during the acute phase of infection. The lowest concentration of SARS-CoV-2 viral copies this assay can detect is 250 copies / mL. A negative result does not preclude SARS-CoV-2 infection and should not be used as the sole basis for treatment or other patient management decisions.  A negative result may occur with improper specimen collection / handling, submission of specimen other than nasopharyngeal swab, presence of viral mutation(s) within the areas targeted by this assay, and inadequate number of viral copies (<250 copies / mL). A negative result must be combined with clinical observations, patient history, and epidemiological information.  Fact Sheet for Patients:   StrictlyIdeas.no  Fact Sheet for Healthcare Providers: BankingDealers.co.za  This test is not yet approved or  cleared by the [place] FDA and has been authorized for detection and/or diagnosis of SARS-CoV-2 by FDA under an Emergency Use Authorization (EUA).  This EUA will remain in effect (meaning this test can be used) for the duration of the COVID-19 declaration under Section 564(b)(1) of the Act, 21 U.S.C. section 360bbb-3(b)(1), unless the authorization is terminated or revoked sooner.  Performed at [facility], [address], [place], [place] [address]   Urine Culture     Status: Abnormal   Collection Time: 08/24/20  6:54 PM   Specimen: Urine, Random  Result Value Ref Range Status   Specimen Description   Final    URINE, RANDOM Performed at [facility], [address], [place], [place] [address]    Special Requests   Final     NONE Performed at [facility], [address] [place], [place] [address]    Culture (A)  Final    <10,000 COLONIES/mL INSIGNIFICANT GROWTH Performed at [facility], [place] [address] [place], [place] [address]    Report Status 08/26/2020 FINAL  Final  Urine Culture     Status: None   Collection Time: 08/27/20  1:54 PM   Specimen: Urine, Random  Result Value Ref Range Status   Specimen Description   Final    URINE, RANDOM Performed at [facility], [address], [place], [place] [address]    Special Requests   Final    NONE Performed at [facility], [address] [place], [place] [address]    Culture   Final    NO GROWTH Performed at [facility], [place] [address] [place],  [address]    Report Status 08/28/2020 FINAL  Final     Labs: BNP (last 3 results) No results for  input(s): BNP in the last 8760 hours. Basic Metabolic Panel: Recent Labs  Lab 08/25/20 0415 08/26/20 0517 08/27/20 0455 08/28/20 0545 08/29/20 0439  NA 147* 141 138 138 139  K 3.2* 2.7* 3.3* 3.8 3.9  CL 118* 111 110 113* 112*  CO2 22 21* 20* 21* 18*  GLUCOSE 120* 105* 123* 111* 101*  BUN 62* 45* 30* 24* 19  CREATININE 1.86* 1.41* 1.13* 1.02* 1.07*  CALCIUM 8.3* 8.0* 7.8* 7.9* 8.3*  MG  --  1.8  --   --  1.7  PHOS  --   --  2.2*  --  2.3*   Liver Function Tests: Recent Labs  Lab 08/24/20 1339 08/26/20 0517 08/27/20 0455 08/28/20 0545  AST 56* 46* 31 27  ALT 28 36 30 25  ALKPHOS 92 60 57 56  BILITOT 1.8* 0.9 0.8 0.6  PROT 8.2* 5.7* 5.0* 5.4*  ALBUMIN 4.3 2.9* 2.5* 2.6*   No results for input(s): LIPASE, AMYLASE in the last 168 hours. No results for input(s): AMMONIA in the last 168 hours. CBC: Recent Labs  Lab 08/24/20 1339 08/25/20 0415 08/26/20 0517 08/27/20 0455 08/28/20 0545  WBC 16.2* 13.3* 11.9* 9.5 9.4  NEUTROABS 14.3*  --  9.3* 7.2 7.0  HGB 16.8* 12.7 13.0 12.2 13.2  HCT 53.4* 38.9 37.9 35.5* 38.9  MCV 94.3 91.3  87.5 88.5 89.0  PLT 303 186 175 163 183   Cardiac Enzymes: No results for input(s): CKTOTAL, CKMB, CKMBINDEX, TROPONINI in the last 168 hours. BNP: Invalid input(s): POCBNP CBG: No results for input(s): GLUCAP in the last 168 hours. D-Dimer No results for input(s): DDIMER in the last 72 hours. Hgb A1c No results for input(s): HGBA1C in the last 72 hours. Lipid Profile No results for input(s): CHOL, HDL, LDLCALC, TRIG, CHOLHDL, LDLDIRECT in the last 72 hours. Thyroid function studies No results for input(s): TSH, T4TOTAL, T3FREE, THYROIDAB in the last 72 hours.  Invalid input(s): FREET3 Anemia work up No results for input(s): VITAMINB12, FOLATE, FERRITIN, TIBC, IRON, RETICCTPCT in the last 72 hours. Urinalysis    Component Value Date/Time   COLORURINE YELLOW (A) 08/24/2020 1854   APPEARANCEUR CLOUDY (A) 08/24/2020 1854   APPEARANCEUR Cloudy (A) 05/04/2016 1423   LABSPEC 1.018 08/24/2020 1854   PHURINE 5.0 08/24/2020 1854   GLUCOSEU NEGATIVE 08/24/2020 1854   HGBUR MODERATE (A) 08/24/2020 1854   BILIRUBINUR NEGATIVE 08/24/2020 1854   BILIRUBINUR Negative 05/04/2016 [place] 08/24/2020 1854   PROTEINUR 30 (A) 08/24/2020 1854   NITRITE NEGATIVE 08/24/2020 1854   LEUKOCYTESUR TRACE (A) 08/24/2020 1854   Sepsis Labs Invalid input(s): PROCALCITONIN,  WBC,  LACTICIDVEN Microbiology Recent Results (from the past 240 hour(s))  Culture, blood (routine x 2)     Status: None   Collection Time: 08/24/20  1:39 PM   Specimen: BLOOD  Result Value Ref Range Status   Specimen Description BLOOD LEFT HAND  Final   Special Requests   Final    BOTTLES DRAWN AEROBIC AND ANAEROBIC Blood Culture results may not be optimal due to an inadequate volume of blood received in culture bottles   Culture   Final    NO GROWTH 5 DAYS Performed at [facility], [address], [place], [place] [address]    Report Status 08/29/2020 FINAL  Final  Culture, blood (routine x 2)      Status: None   Collection Time: 08/24/20  1:39 PM   Specimen: BLOOD  Result Value Ref Range Status  Specimen Description BLOOD RIGHT FOREARM  Final   Special Requests   Final    BOTTLES DRAWN AEROBIC AND ANAEROBIC Blood Culture results may not be optimal due to an inadequate volume of blood received in culture bottles   Culture   Final    NO GROWTH 5 DAYS Performed at [facility], [address], [place], [place] [address]    Report Status 08/29/2020 FINAL  Final  SARS Coronavirus 2 by RT PCR (hospital order, performed in [facility] hospital lab) Nasopharyngeal     Status: None   Collection Time: 08/24/20  6:54 PM   Specimen: Nasopharyngeal  Result Value Ref Range Status   SARS Coronavirus 2 NEGATIVE NEGATIVE Final    Comment: (NOTE) SARS-CoV-2 target nucleic acids are NOT DETECTED.  The SARS-CoV-2 RNA is generally detectable in upper and lower respiratory specimens during the acute phase of infection. The lowest concentration of SARS-CoV-2 viral copies this assay can detect is 250 copies / mL. A negative result does not preclude SARS-CoV-2 infection and should not be used as the sole basis for treatment or other patient management decisions.  A negative result may occur with improper specimen collection / handling, submission of specimen other than nasopharyngeal swab, presence of viral mutation(s) within the areas targeted by this assay, and inadequate number of viral copies (<250 copies / mL). A negative result must be combined with clinical observations, patient history, and epidemiological information.  Fact Sheet for Patients:   StrictlyIdeas.no  Fact Sheet for Healthcare Providers: BankingDealers.co.za  This test is not yet approved or  cleared by the [place] FDA and has been authorized for detection and/or diagnosis of SARS-CoV-2 by FDA under an Emergency Use Authorization (EUA).  This EUA will  remain in effect (meaning this test can be used) for the duration of the COVID-19 declaration under Section 564(b)(1) of the Act, 21 U.S.C. section 360bbb-3(b)(1), unless the authorization is terminated or revoked sooner.  Performed at [facility], [address] [place], [place] [address]   Urine Culture     Status: Abnormal   Collection Time: 08/24/20  6:54 PM   Specimen: Urine, Random  Result Value Ref Range Status   Specimen Description   Final    URINE, RANDOM Performed at [facility], [address], [place], [place] [address]    Special Requests   Final    NONE Performed at [facility], [place] [place], [place] [address]    Culture (A)  Final    <10,000 COLONIES/mL INSIGNIFICANT GROWTH Performed at [facility], [place] [address] [place], [place] [address]    Report Status 08/26/2020 FINAL  Final  Urine Culture     Status: None   Collection Time: 08/27/20  1:54 PM   Specimen: Urine, Random  Result Value Ref Range Status   Specimen Description   Final    URINE, RANDOM Performed at [facility], [address], [place], [place] [address]    Special Requests   Final    NONE Performed at [facility], [address], [place], [place] [address]    Culture   Final    NO GROWTH Performed at [facility], [place] [address], [place], [place] [address]    Report Status 08/28/2020 FINAL  Final     Time coordinating discharge: Over 30 minutes  SIGNED:   Sidney Ace, MD  [facility] 08/29/2020, 10:54 AM Pager   If 7PM-7AM, please  contact night-coverage

## 2020-08-29 NOTE — Progress Notes (Signed)
Palliative: Mrs. Rancourt is lying quietly in bed.  She has a flat affect, and although is alert, does not make eye contact or try to interact with me in any meaningful way.  She will take a few bites of applesauce, but declines liquids.  I do not believe she is able to make her basic needs known.  There is no family at bedside at this time.  Conference with nursing staff at bedside related to patient condition, needs, goals of care.  Family has elected comfort and dignity at end-of-life, returning to her facility with the benefits of hospice.  They share their love for Mrs. Chestang, and the declines that she is experienced over the last few years.  Conference with attending, bedside nursing staff, transition of care team and local hospice liaison related to patient condition, needs, disposition.  Plan: Returning to [facility] memory care with the benefits of hospice.  Do not rehospitalize.  Let nature take its course with comfort and dignity.  25 minutes Quinn Axe, NP Palliative medicine team Team phone [phone] Greater than 50% of this time was spent counseling and coordinating care related to the above assessment and plan.

## 2020-08-29 NOTE — Progress Notes (Addendum)
[place] [facility]) Hospital Liaison RN note:  Received request from Dr. Priscella Mann and Armandina Stammer, [facility], for hospice services at [facility] at discharge. Chart and patient information sent to [facility] physician and hospice eligibility has been approved.  Spoke with spouse, [name], to initiate education related to hospice philosophy, services and to answer any questions. Doug verbalized understanding of information given. Plan is for discharge to [facility] by EMS today.  DME needs discussed with TOC. Request has been made to referral for a hospital bed with no rails to be delivered to [facility] today.  Please send signed and completed DNR with patient. Please provide prescriptions at discharge as need to ensure ongoing symptom management.   Please call with any hospice related questions or questions.  Thank you for the opportunity to participate in this patient's care.  Zandra Abts, RN [facility] Liaison [phone]

## 2020-10-21 DEATH — deceased
